# Patient Record
Sex: Male | Born: 1995 | Race: Black or African American | Hispanic: No | Marital: Single
Health system: Southern US, Community
[De-identification: ages and names within clinical notes are randomized; demographics above are authoritative.]

## PROBLEM LIST (undated history)

## (undated) DIAGNOSIS — J45909 Unspecified asthma, uncomplicated: Secondary | ICD-10-CM

---
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
## 2017-04-15 IMAGING — DX DG FINGER MIDDLE 2+V*L*
3 series · 3 of 3 positions shown · non-contrast
Comparison: None.

CLINICAL DATA: Left middle finger pain and swelling following
basketball injury 5 weeks ago.

EXAM:
LEFT MIDDLE FINGER 2+V

[finger ap]
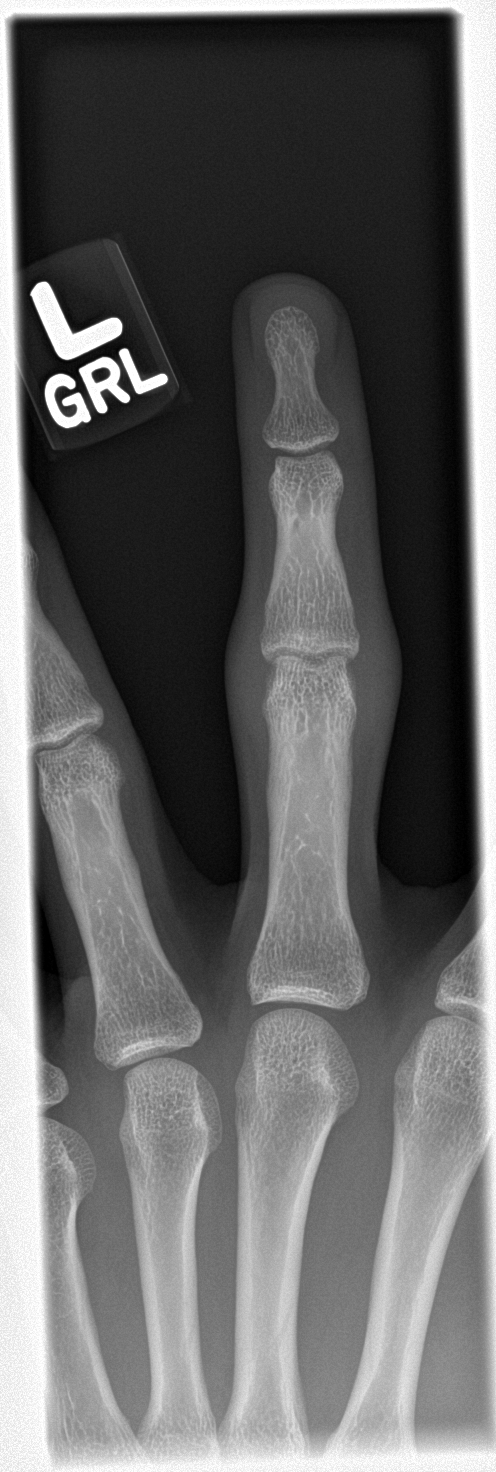

[finger obl]
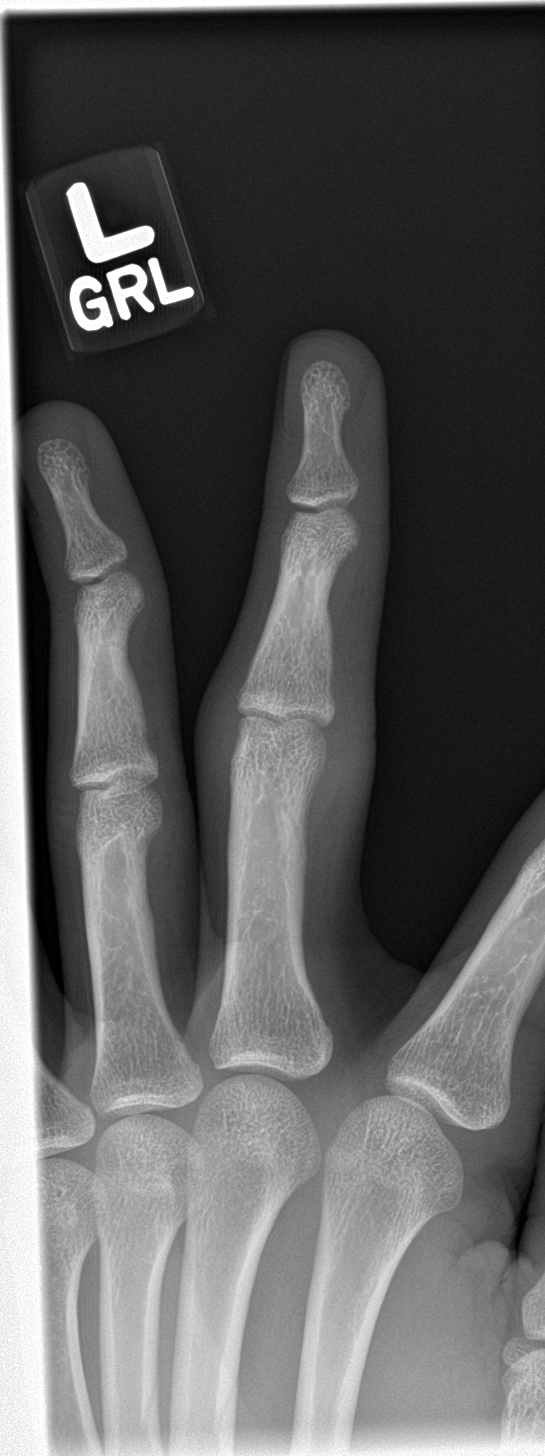

[finger lat]
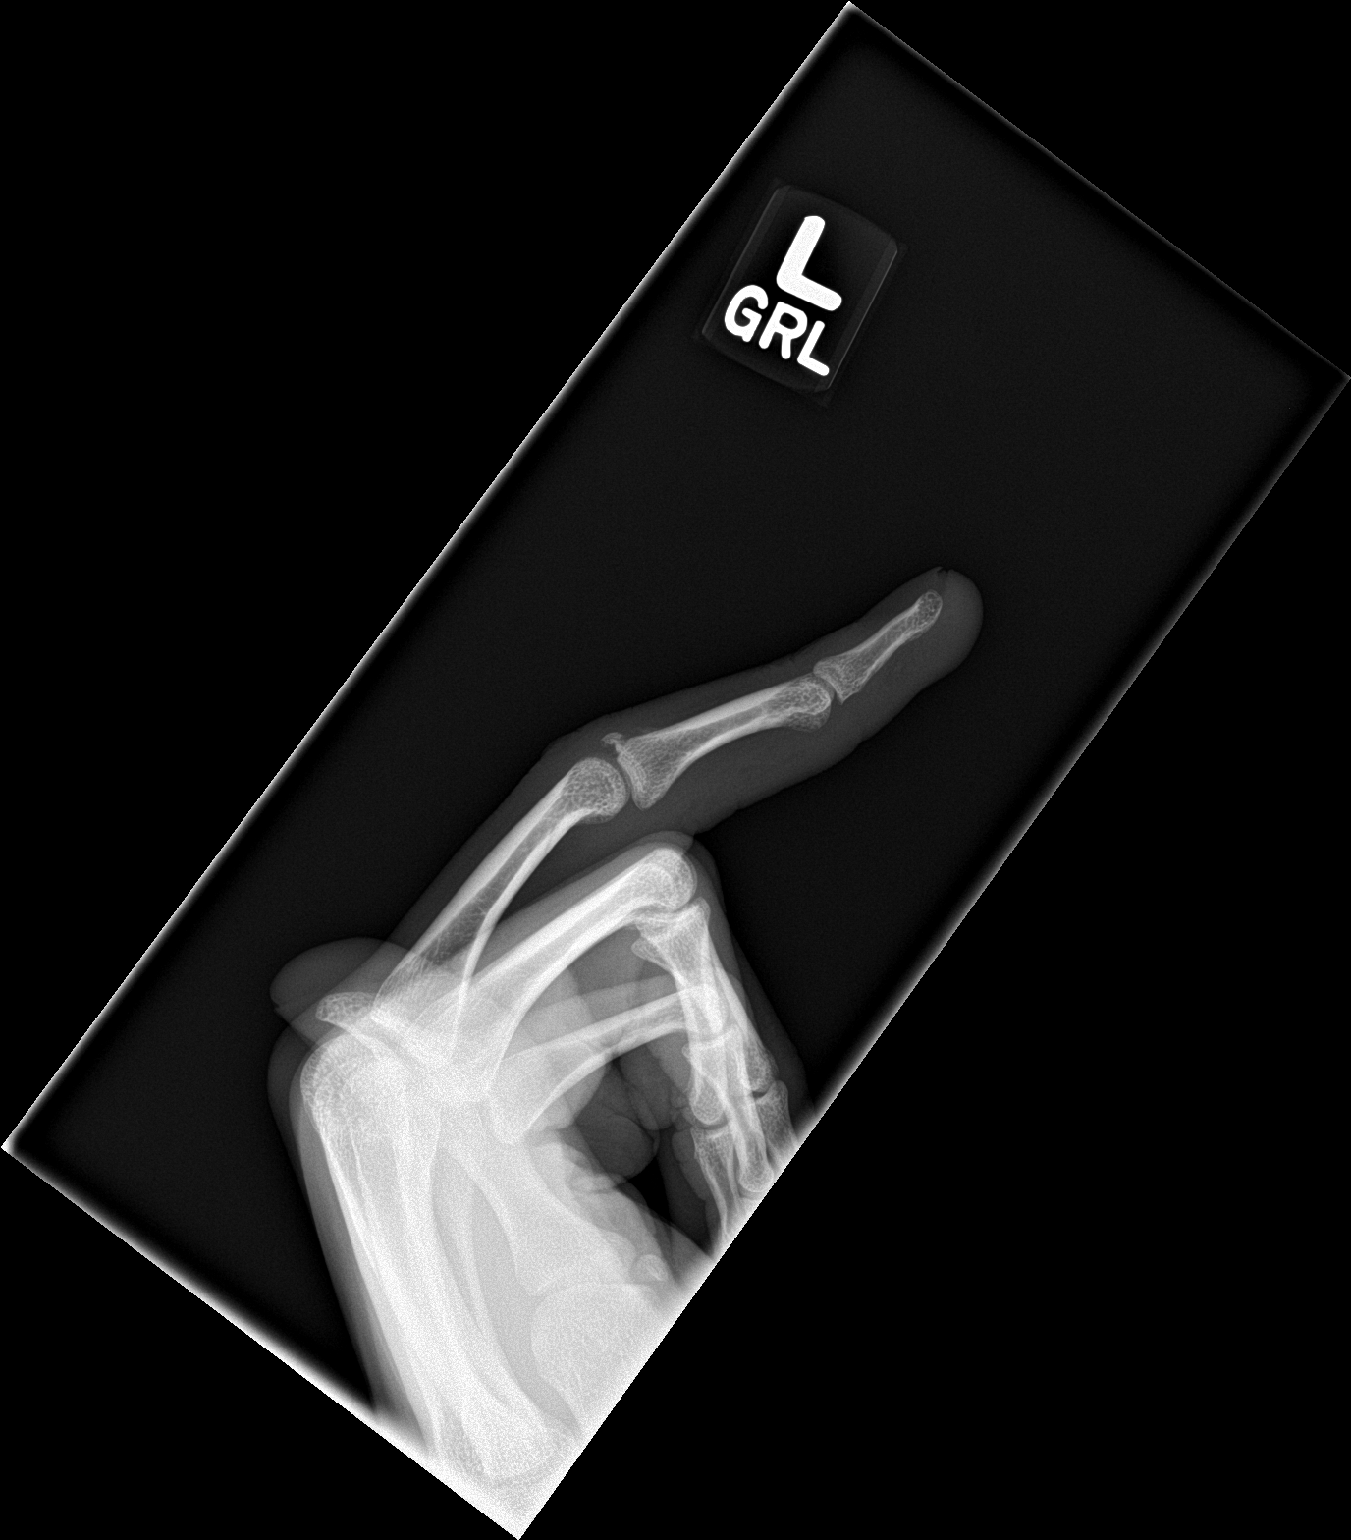

[3 of 3 positions shown; findings below may reference images not displayed]

FINDINGS: There is an avulsion fracture off of the dorsal proximal aspect of
the middle phalanx. Associated soft tissue swelling is noted.

No subluxation or dislocation identified.
IMPRESSION: Avulsion fracture of the dorsal proximal aspect of the middle
phalanx.

## 2017-08-06 ENCOUNTER — Emergency Department
Admission: EM | Admit: 2017-08-06 | Discharge: 2017-08-06 | Disposition: A | Discharge status: Home / Self Care | Payer: Medicaid Other | Attending: Emergency Medicine | Admitting: Emergency Medicine

## 2017-08-06 ENCOUNTER — Other Ambulatory Visit: Payer: Self-pay

## 2017-08-06 DIAGNOSIS — Z5321 Procedure and treatment not carried out due to patient leaving prior to being seen by health care provider: Secondary | ICD-10-CM | POA: Insufficient documentation

## 2017-08-06 DIAGNOSIS — R509 Fever, unspecified: Secondary | ICD-10-CM | POA: Diagnosis not present

## 2017-08-06 DIAGNOSIS — R0989 Other specified symptoms and signs involving the circulatory and respiratory systems: Secondary | ICD-10-CM | POA: Diagnosis present

## 2017-08-06 NOTE — ED Triage Notes (Signed)
Pt arrives to ED via POV with c/o cold-like s/x's. Pt reports chest congestion and fever x2 days. No c/o N/V/D, no CP or ABD pain. Pt is A&O, in NAD; RR even, regular, and unlabored.

## 2018-08-11 ENCOUNTER — Encounter: Payer: Self-pay | Admitting: Emergency Medicine

## 2018-08-11 ENCOUNTER — Emergency Department
Admission: EM | Admit: 2018-08-11 | Discharge: 2018-08-12 | Disposition: A | Discharge status: Other Acute Inpatient Hospital | Payer: Medicaid Other | Attending: Emergency Medicine | Admitting: Emergency Medicine

## 2018-08-11 ENCOUNTER — Other Ambulatory Visit: Payer: Self-pay

## 2018-08-11 DIAGNOSIS — F17200 Nicotine dependence, unspecified, uncomplicated: Secondary | ICD-10-CM | POA: Insufficient documentation

## 2018-08-11 DIAGNOSIS — R4689 Other symptoms and signs involving appearance and behavior: Secondary | ICD-10-CM | POA: Insufficient documentation

## 2018-08-11 DIAGNOSIS — F322 Major depressive disorder, single episode, severe without psychotic features: Secondary | ICD-10-CM | POA: Diagnosis present

## 2018-08-11 DIAGNOSIS — Z046 Encounter for general psychiatric examination, requested by authority: Secondary | ICD-10-CM | POA: Diagnosis present

## 2018-08-11 DIAGNOSIS — J45909 Unspecified asthma, uncomplicated: Secondary | ICD-10-CM | POA: Insufficient documentation

## 2018-08-11 DIAGNOSIS — F329 Major depressive disorder, single episode, unspecified: Secondary | ICD-10-CM | POA: Insufficient documentation

## 2018-08-11 LAB — URINALYSIS, ROUTINE W REFLEX MICROSCOPIC
BACTERIA UA: NONE SEEN
Bilirubin Urine: NEGATIVE
Glucose, UA: NEGATIVE mg/dL
HGB URINE DIPSTICK: NEGATIVE
Ketones, ur: NEGATIVE mg/dL
Nitrite: NEGATIVE
PROTEIN: NEGATIVE mg/dL
Specific Gravity, Urine: 1.015 (ref 1.005–1.030)
pH: 6 (ref 5.0–8.0)

## 2018-08-11 LAB — COMPREHENSIVE METABOLIC PANEL
ALK PHOS: 64 U/L (ref 38–126)
ALT: 56 U/L — AB (ref 0–44)
AST: 233 U/L — AB (ref 15–41)
Albumin: 4.5 g/dL (ref 3.5–5.0)
Anion gap: 10 (ref 5–15)
BUN: 12 mg/dL (ref 6–20)
CALCIUM: 9.2 mg/dL (ref 8.9–10.3)
CO2: 25 mmol/L (ref 22–32)
CREATININE: 0.94 mg/dL (ref 0.61–1.24)
Chloride: 102 mmol/L (ref 98–111)
GFR calc Af Amer: >60 mL/min (ref >60)
Glucose, Bld: 88 mg/dL (ref 70–99)
Potassium: 3.4 mmol/L — ABNORMAL LOW (ref 3.5–5.1)
Sodium: 137 mmol/L (ref 135–145)
Total Bilirubin: 1 mg/dL (ref 0.3–1.2)
Total Protein: 7.5 g/dL (ref 6.5–8.1)

## 2018-08-11 LAB — URINE DRUG SCREEN, QUALITATIVE (ARMC ONLY)
Amphetamines, Ur Screen: NOT DETECTED
Barbiturates, Ur Screen: NOT DETECTED
Benzodiazepine, Ur Scrn: POSITIVE — AB
CANNABINOID 50 NG, UR ~~LOC~~: POSITIVE — AB
COCAINE METABOLITE, UR ~~LOC~~: NOT DETECTED
MDMA (ECSTASY) UR SCREEN: NOT DETECTED
Methadone Scn, Ur: NOT DETECTED
Opiate, Ur Screen: NOT DETECTED
PHENCYCLIDINE (PCP) UR S: NOT DETECTED
Tricyclic, Ur Screen: NOT DETECTED

## 2018-08-11 LAB — CBC WITH DIFFERENTIAL/PLATELET
Abs Immature Granulocytes: 0.03 10*3/uL (ref 0.00–0.07)
Basophils Absolute: 0.1 10*3/uL (ref 0.0–0.1)
Basophils Relative: 1 %
EOS PCT: 4 %
Eosinophils Absolute: 0.4 10*3/uL (ref 0.0–0.5)
HEMATOCRIT: 45.4 % (ref 39.0–52.0)
HEMOGLOBIN: 14.9 g/dL (ref 13.0–17.0)
Immature Granulocytes: 0 %
LYMPHS ABS: 2.5 10*3/uL (ref 0.7–4.0)
LYMPHS PCT: 26 %
MCH: 31 pg (ref 26.0–34.0)
MCHC: 32.8 g/dL (ref 30.0–36.0)
MCV: 94.6 fL (ref 80.0–100.0)
MONO ABS: 0.8 10*3/uL (ref 0.1–1.0)
Monocytes Relative: 8 %
Neutro Abs: 6 10*3/uL (ref 1.7–7.7)
Neutrophils Relative %: 61 %
Platelets: 270 10*3/uL (ref 150–400)
RBC: 4.8 MIL/uL (ref 4.22–5.81)
RDW: 12.8 % (ref 11.5–15.5)
WBC: 9.7 10*3/uL (ref 4.0–10.5)
nRBC: 0 % (ref 0.0–0.2)

## 2018-08-11 LAB — ETHANOL

## 2018-08-11 NOTE — ED Notes (Signed)
IVC prior to arrival / [facility] ordered & called waiting on call back for evaluation

## 2018-08-11 NOTE — ED Notes (Signed)
Hourly rounding reveals patient in room. No complaints, stable, in no acute distress. Q15 minute rounds and monitoring via Rover and Officer to continue.   

## 2018-08-11 NOTE — ED Provider Notes (Addendum)
[facility] Emergency Department Provider Note   ____________________________________________   First MD Initiated Contact with Patient 08/11/18 2127     (approximate)  I have reviewed the triage vital signs and the nursing notes.   HISTORY  Chief Complaint Psychiatric Evaluation    HPI Ronald [name] is a 23 y.o. male patient brought in by [facility] police department.  They tell us that the patient has been acting out at home threatening people breaking things and saying he wants to kill himself.  When I see him he is laying on the bed and will not wake up and will not talk to me.        Past Medical History:  Diagnosis Date  . Asthma     There are no active problems to display for this patient.   History reviewed. No pertinent surgical history.  Prior to Admission medications   Not on File    Allergies Patient has no known allergies.  History reviewed. No pertinent family history.  Social History Social History   Tobacco Use  . Smoking status: Light Tobacco Smoker  . Smokeless tobacco: Never Used  Substance Use Topics  . Alcohol use: No  . Drug use: Not on file    Review of Systems  Unable to obtain  ____________________________________________   PHYSICAL EXAM:  VITAL SIGNS: ED Triage Vitals  Enc Vitals Group     BP 08/11/18 2020 120/78     Pulse Rate 08/11/18 2020 82     Resp 08/11/18 2020 18     Temp 08/11/18 2020 98.5 F (36.9 C)     Temp Source 08/11/18 2020 Oral     SpO2 08/11/18 2020 98 %     Weight 08/11/18 2021 139 lb (63 kg)     Height 08/11/18 2021 5\' 11"  (1.803 m)     Head Circumference --      Peak Flow --      Pain Score 08/11/18 2020 4     Pain Loc --      Pain Edu? --      Excl. in GC? --     Constitutional: Apparently sleeping and only briefly arousable Eyes: Conjunctivae are normal.  Head: Atraumatic. Nose: No congestion/rhinnorhea. Mouth/Throat: Mucous membranes are moist.  Oropharynx  non-erythematous. Neck: No stridor.   Cardiovascular: Normal rate, regular rhythm. Grossly normal heart sounds.  Good peripheral circulation. Respiratory: Normal respiratory effort.  No retractions. Lungs CTAB. Gastrointestinal: Soft and nontender. No distention. No abdominal bruits. No CVA tenderness. Musculoskeletal: No lower extremity tenderness nor edema.  Neurologic:  No gross focal neurologic deficits are appreciated.  Patient does briefly move his extremities symmetrically Skin:  Skin is warm, dry and intact.    ____________________________________________   LABS (all labs ordered are listed, but only abnormal results are displayed)  Labs Reviewed  COMPREHENSIVE METABOLIC PANEL - Abnormal; Notable for the following components:      Result Value   Potassium 3.4 (*)    AST 233 (*)    ALT 56 (*)    All other components within normal limits  URINE DRUG SCREEN, QUALITATIVE ([facility] ONLY) - Abnormal; Notable for the following components:   Cannabinoid 50 Ng, Ur [place] POSITIVE (*)    Benzodiazepine, Ur Scrn POSITIVE (*)    All other components within normal limits  URINALYSIS, ROUTINE W REFLEX MICROSCOPIC - Abnormal; Notable for the following components:   Color, Urine YELLOW (*)    APPearance HAZY (*)    Leukocytes,Ua  LARGE (*)    WBC, UA >50 (*)    All other components within normal limits  ETHANOL  CBC WITH DIFFERENTIAL/PLATELET   ____________________________________________  EKG   ____________________________________________  RADIOLOGY  ED MD interpretation:  Official radiology report(s): No results found.  ____________________________________________   PROCEDURES  Procedure(s) performed (including Critical Care):  Procedures   ____________________________________________   INITIAL IMPRESSION / ASSESSMENT AND PLAN / ED COURSE  Patient was not awake the last time I saw him yet or.  Perhaps pretending to sleep which is a little more likely.  Vitals are  still stable.  Waiting for psych to see him.    ----------------------------------------- 1:26 AM on 08/12/2018 -----------------------------------------  Patient wakes up briefly now says nothing is hurting him he says he does not have any medical problems he says he wants to go to sleep and then he says he wants to eat something and goes to sleep does not wake up again.  He is breathing easily and well.  We will continue to watch him         ____________________________________________   FINAL CLINICAL IMPRESSION(S) / ED DIAGNOSES  Final diagnoses:  Aggressive behavior     ED Discharge Orders    None       Note:  This document was prepared using Dragon voice recognition software and may include unintentional dictation errors.    Arnaldo Natal, MD 08/12/18 [id number]    Arnaldo Natal, MD 08/12/18 [phone]

## 2018-08-11 NOTE — ED Notes (Signed)
Snack and beverage given. 

## 2018-08-11 NOTE — ED Triage Notes (Signed)
Pt arrived to the ED via [employer] under IVC to have a psychiatric evaluation. Pt reports that he has been experiencing problem with his family and would like to be evaluated by a psychiatrist. [employer] officer states that the Pt has been violent towards his family threatening to kill himself and breaking thing around the house. Pt has flight of ideas, tangential conversation, cooperative during triage.

## 2018-08-11 NOTE — ED Notes (Signed)
Hourly rounding reveals patient in room. No complaints, stable, in no acute distress. Q15 minute rounds and monitoring via Security Cameras to continue. 

## 2018-08-11 NOTE — ED Notes (Signed)
Pt. Transferred from Triage to room 21 after dressing out and screening for contraband. Report to include Situation, Background, Assessment and Recommendations from [facility]. Pt. Oriented to Quad including Q15 minute rounds as well as Psychologist, counselling for their protection. Patient is alert and oriented, warm and dry in no acute distress. Patient reported SI contracted for safety. Denied HI, and AVH. Pt. Encouraged to let me know if needs arise.

## 2018-08-11 NOTE — ED Notes (Signed)
Pt has clothing (blue shirt, pants, black socks, blue short pants, white sneakers, wallet with cash.) Hospital security was notified, so that they can inventory the belongings.

## 2018-08-12 ENCOUNTER — Other Ambulatory Visit: Payer: Self-pay

## 2018-08-12 ENCOUNTER — Inpatient Hospital Stay
Admission: AD | Admit: 2018-08-12 | Discharge: 2018-08-13 | DRG: 882 | Disposition: A | Discharge status: Home / Self Care | Payer: Medicaid Other | Source: Intra-hospital | Attending: Psychiatry | Admitting: Psychiatry

## 2018-08-12 ENCOUNTER — Encounter: Payer: Self-pay | Admitting: Psychiatry

## 2018-08-12 DIAGNOSIS — R45851 Suicidal ideations: Secondary | ICD-10-CM | POA: Diagnosis present

## 2018-08-12 DIAGNOSIS — F329 Major depressive disorder, single episode, unspecified: Secondary | ICD-10-CM | POA: Diagnosis not present

## 2018-08-12 DIAGNOSIS — R945 Abnormal results of liver function studies: Secondary | ICD-10-CM | POA: Diagnosis present

## 2018-08-12 DIAGNOSIS — F4323 Adjustment disorder with mixed anxiety and depressed mood: Principal | ICD-10-CM

## 2018-08-12 DIAGNOSIS — F1010 Alcohol abuse, uncomplicated: Secondary | ICD-10-CM

## 2018-08-12 DIAGNOSIS — N342 Other urethritis: Secondary | ICD-10-CM | POA: Diagnosis present

## 2018-08-12 DIAGNOSIS — F1210 Cannabis abuse, uncomplicated: Secondary | ICD-10-CM

## 2018-08-12 DIAGNOSIS — J45909 Unspecified asthma, uncomplicated: Secondary | ICD-10-CM | POA: Diagnosis present

## 2018-08-12 DIAGNOSIS — F322 Major depressive disorder, single episode, severe without psychotic features: Secondary | ICD-10-CM | POA: Diagnosis present

## 2018-08-12 MED ORDER — TRAZODONE HCL 50 MG PO TABS: 50.0000 mg | ORAL_TABLET | Freq: Every evening | ORAL | Status: DC | PRN

## 2018-08-12 MED ORDER — CEFTRIAXONE SODIUM 250 MG IJ SOLR
250.0000 mg | Freq: Once | INTRAMUSCULAR | Status: AC
  Administered 2018-08-12: 250 mg via INTRAMUSCULAR
  Filled 2018-08-12 (×2): qty 250

## 2018-08-12 MED ORDER — ALUM & MAG HYDROXIDE-SIMETH 200-200-20 MG/5ML PO SUSP: 30.0000 mL | ORAL | Status: DC | PRN

## 2018-08-12 MED ORDER — HALOPERIDOL LACTATE 5 MG/ML IJ SOLN: 2.5000 mg | INTRAMUSCULAR | Status: DC | PRN

## 2018-08-12 MED ORDER — LIDOCAINE HCL 1 % IJ SOLN
10.0000 mL | Freq: Once | INTRAMUSCULAR | Status: AC
  Administered 2018-08-12: 10 mL
  Filled 2018-08-12: qty 10

## 2018-08-12 MED ORDER — LORAZEPAM 2 MG/ML IJ SOLN: 1.0000 mg | INTRAMUSCULAR | Status: DC | PRN

## 2018-08-12 MED ORDER — ACETAMINOPHEN 325 MG PO TABS: 650.0000 mg | ORAL_TABLET | Freq: Four times a day (QID) | ORAL | Status: DC | PRN

## 2018-08-12 MED ORDER — MAGNESIUM HYDROXIDE 400 MG/5ML PO SUSP: 30.0000 mL | Freq: Every day | ORAL | Status: DC | PRN

## 2018-08-12 MED ORDER — LORAZEPAM 1 MG PO TABS
1.0000 mg | ORAL_TABLET | ORAL | Status: DC | PRN
  Administered 2018-08-12 (×2): 1 mg via ORAL
  Filled 2018-08-12 (×2): qty 1

## 2018-08-12 MED ORDER — NICOTINE 14 MG/24HR TD PT24
14.0000 mg | MEDICATED_PATCH | Freq: Every day | TRANSDERMAL | Status: DC
  Administered 2018-08-12 – 2018-08-13 (×2): 14 mg via TRANSDERMAL
  Filled 2018-08-12 (×2): qty 1

## 2018-08-12 MED ORDER — HALOPERIDOL 0.5 MG PO TABS: 2.5000 mg | ORAL_TABLET | ORAL | Status: DC | PRN

## 2018-08-12 MED ORDER — HYDROXYZINE HCL 25 MG PO TABS: 25.0000 mg | ORAL_TABLET | ORAL | Status: DC | PRN

## 2018-08-12 MED ORDER — AZITHROMYCIN 1 G PO PACK
1.0000 g | PACK | Freq: Once | ORAL | Status: AC
  Administered 2018-08-12: 1 g via ORAL
  Filled 2018-08-12 (×2): qty 1

## 2018-08-12 MED ORDER — HALOPERIDOL 5 MG PO TABS
2.5000 mg | ORAL_TABLET | ORAL | Status: DC | PRN
  Administered 2018-08-12: 2.5 mg via ORAL
  Filled 2018-08-12: qty 1

## 2018-08-12 MED ORDER — TRAZODONE HCL 50 MG PO TABS
50.0000 mg | ORAL_TABLET | Freq: Every evening | ORAL | Status: DC | PRN
  Administered 2018-08-12: 50 mg via ORAL
  Filled 2018-08-12: qty 1

## 2018-08-12 MED ORDER — NICOTINE 14 MG/24HR TD PT24: 14.0000 mg | MEDICATED_PATCH | Freq: Every day | TRANSDERMAL | Status: DC

## 2018-08-12 MED ORDER — CITALOPRAM HYDROBROMIDE 20 MG PO TABS
20.0000 mg | ORAL_TABLET | Freq: Every day | ORAL | Status: DC
  Administered 2018-08-12 – 2018-08-13 (×2): 20 mg via ORAL
  Filled 2018-08-12 (×2): qty 1

## 2018-08-12 MED ORDER — LORAZEPAM 1 MG PO TABS: 1.0000 mg | ORAL_TABLET | ORAL | Status: DC | PRN

## 2018-08-12 MED ORDER — MAGNESIUM HYDROXIDE 400 MG/5ML PO SUSP
30.0000 mL | Freq: Every day | ORAL | Status: DC | PRN
  Filled 2018-08-12: qty 30

## 2018-08-12 NOTE — ED Notes (Signed)
Hourly rounding reveals patient in room. No complaints, stable, in no acute distress. Q15 minute rounds and monitoring via Rover and Officer to continue.   

## 2018-08-12 NOTE — Progress Notes (Signed)
D: Received patient from [facility] Emergency Department. Patient skin assessment completed with Kayla, BHT, skin is intact, with the exception of superficial scabbing from what looks like the patient's left wrist scrapped the ground, but no contraband found with all unit prohibited items locked and stored away for discharge. Pt. Was admitted under the services of, Dr. Toni Amend.   Patient during the admissions process is only partially compliant, visibly anxious and eventually elevated in agitations. Pt. Was not able to sign admissions paperwork, refusing to sign any required documents. Pt. Upon interviewing with this writer states, "I was told I could leave today upstairs, are you serious?", when told he needed to speak to the doctor about this matter for further details into the length of stay here on the unit. Pt. When attempting to explain to this writer why he has been brought involuntarily to [place] is disorganized in his thought process and his story is difficult to piece together a good history or gather information. Pt. Does answer questions. Pt. Denies si/hi/avh, contract for safety and denies pain, but expresses anger towards family for being sent here. Pt. Given PRN medications to reduce visible agitations and anxiety for comfort and safety.    A: Patient oriented to unit/room/call light. Pt. Given extensive admissions education, but is not accepting of information provided. Patient was encourage to participate in unit activities and continue with plan of care being put into place. Q x 15 minute observation checks were initiated for safety.   R: Patient is not receptive to treatment plan being put into place and safety to be maintained on unit per MD orders.

## 2018-08-12 NOTE — Tx Team (Addendum)
Initial Treatment Plan 08/12/2018 1:20 PM Ghassan Madilyn Fireman PHX:[phone]    PATIENT STRESSORS: Family conflict Mood instability    PATIENT STRENGTHS: Communication skills Physical Health Special hobby/interest Supportive family/friends   PATIENT IDENTIFIED PROBLEMS: Mood instability 08/12/2018  Agitation 08/12/2018                   DISCHARGE CRITERIA:  Improved stabilization in mood, thinking, and/or behavior Motivation to continue treatment in a less acute level of care Need for constant or close observation no longer present Verbal commitment to aftercare and medication compliance  PRELIMINARY DISCHARGE PLAN: Outpatient therapy Return to previous living arrangement Return to previous work or school arrangements  PATIENT/FAMILY INVOLVEMENT: This treatment plan has been presented to and reviewed with the patient, Walden Field.  The patient has been given the opportunity to ask questions and make suggestions.  Lenox Ponds, RN 08/12/2018, 1:20 PM
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
## 2018-08-12 NOTE — Plan of Care (Signed)
Patient just recently admitted to the unit. Patient has not had sufficient time to show progressions at this time. Will continue to monitor for progressions.    Problem: Education: Goal: Emotional status will improve Outcome: Not Progressing Goal: Mental status will improve Outcome: Not Progressing   Problem: Health Behavior/Discharge Planning: Goal: Compliance with treatment plan for underlying cause of condition will improve Outcome: Not Progressing   Problem: Safety: Goal: Periods of time without injury will increase Outcome: Not Progressing   Problem: Education: Goal: Will be free of psychotic symptoms Outcome: Not Progressing

## 2018-08-12 NOTE — ED Notes (Signed)
IVC/SOC completed/ Rec.Inpt Admit  

## 2018-08-12 NOTE — BHH Suicide Risk Assessment (Signed)
[facility] Admission Suicide Risk Assessment   Nursing information obtained from:  Review of record, Patient Demographic factors:  Male, Adolescent or young adult, Low socioeconomic status, Unemployed Current Mental Status:  NA(denies) Loss Factors:  Loss of significant relationship Historical Factors:  Impulsivity Risk Reduction Factors:  NA  Total Time spent with patient: 1 hour Principal Problem: Adjustment disorder with mixed anxiety and depressed mood Diagnosis:  Principal Problem:   Adjustment disorder with mixed anxiety and depressed mood Active Problems:   Cannabis abuse   Alcohol abuse   Urethritis  Subjective Data: Patient interviewed chart reviewed.  Patient is denying any suicidal thoughts.  Denies any homicidal ideation.  Denies any psychotic symptoms.  Admits to some chronic frustration and low mood but no actual wish to harm himself.  Continued Clinical Symptoms:  Alcohol Use Disorder Identification Test Final Score (AUDIT): 0 The "Alcohol Use Disorders Identification Test", Guidelines for Use in Primary Care, Second Edition.  World Science writer [facility]). Score between 0-7:  no or low risk or alcohol related problems. Score between 8-15:  moderate risk of alcohol related problems. Score between 16-19:  high risk of alcohol related problems. Score 20 or above:  warrants further diagnostic evaluation for alcohol dependence and treatment.   CLINICAL FACTORS:   Depression:   Impulsivity   Musculoskeletal: Strength & Muscle Tone: within normal limits Gait & Station: normal Patient leans: N/A  Psychiatric Specialty Exam: Physical Exam  Nursing note and vitals reviewed. Constitutional: He appears well-developed and well-nourished.  HENT:  Head: Normocephalic and atraumatic.  Eyes: Pupils are equal, round, and reactive to light. Conjunctivae are normal.  Neck: Normal range of motion.  Cardiovascular: Regular rhythm and normal heart sounds.  Respiratory: Effort  normal.  GI: Soft.  Musculoskeletal: Normal range of motion.  Neurological: He is alert.  Skin: Skin is warm and dry.  Psychiatric: He has a normal mood and affect. His speech is normal and behavior is normal. Judgment and thought content normal. He is not agitated. Cognition and memory are normal.    Review of Systems  Constitutional: Negative.   HENT: Negative.   Eyes: Negative.   Respiratory: Negative.   Cardiovascular: Negative.   Gastrointestinal: Negative.   Genitourinary: Positive for dysuria.  Musculoskeletal: Negative.   Skin: Negative.   Neurological: Negative.   Psychiatric/Behavioral: Positive for substance abuse.    Blood pressure 135/89, pulse 78, temperature 98.4 F (36.9 C), temperature source Oral, resp. rate 17, height 5\' 11"  (1.803 m), weight 63.7 kg, SpO2 100 %.Body mass index is 19.6 kg/m.  General Appearance: Casual  Eye Contact:  Good  Speech:  Clear and Coherent  Volume:  Normal  Mood:  Euthymic  Affect:  Congruent  Thought Process:  Coherent  Orientation:  Full (Time, Place, and Person)  Thought Content:  Logical  Suicidal Thoughts:  No  Homicidal Thoughts:  No  Memory:  Immediate;   Fair Recent;   Fair Remote;   Fair  Judgement:  Fair  Insight:  Fair  Psychomotor Activity:  Normal  Concentration:  Concentration: Fair  Recall:  [employer] of Knowledge:  Fair  Language:  Fair  Akathisia:  No  Handed:  Right  AIMS (if indicated):     Assets:  Desire for Improvement Physical Health Resilience  ADL's:  Intact  Cognition:  WNL  Sleep:         COGNITIVE FEATURES THAT CONTRIBUTE TO RISK:  Loss of executive function    SUICIDE RISK:   Minimal:  No identifiable suicidal ideation.  Patients presenting with no risk factors but with morbid ruminations; may be classified as minimal risk based on the severity of the depressive symptoms  PLAN OF CARE: Patient will be evaluated by treatment team.  15-minute checks for now.  Reviewed various  treatment options for him.  Starting antidepressant medication.  Patient will be reassessed and discharge planning will be made after suicidality is reassessed before discharge.  I certify that inpatient services furnished can reasonably be expected to improve the patient's condition.   Mordecai Rasmussen, MD 08/12/2018, 4:56 PM

## 2018-08-12 NOTE — Progress Notes (Signed)
Pt. Arrived to unit

## 2018-08-12 NOTE — ED Provider Notes (Signed)
-----------------------------------------   4:39 AM on 08/12/2018 -----------------------------------------   Blood pressure 120/78, pulse 82, temperature 98.5 F (36.9 C), temperature source Oral, resp. rate 18, height 5\' 11"  (1.803 m), weight 63 kg, SpO2 98 %.  The patient is calm and cooperative at this time.  There have been no acute events since the last update. Psychiatry consult note reviewed, recommends inpatient psychiatric care.  PRN Haldol and Ativan as needed for agitation and anxiety.  Continue IVC.   Sharman Cheek, MD 08/12/18 [phone]

## 2018-08-12 NOTE — ED Notes (Signed)
Patient assigned to appropriate care area   Introduced self to pt  Patient oriented to unit/care area: Informed that, for their safety, care areas are designed for safety and visiting and phone hours explained to patient. Patient verbalizes understanding, and verbal contract for safety obtained  Environment secured  

## 2018-08-12 NOTE — BHH Group Notes (Signed)
LCSW Group Therapy Note  08/12/2018 1:00 PM  Type of Therapy/Topic:  Group Therapy:  Feelings about Diagnosis  Participation Level:  Did Not Attend   Description of Group:   This group will allow patients to explore their thoughts and feelings about diagnoses they have received. Patients will be guided to explore their level of understanding and acceptance of these diagnoses. Facilitator will encourage patients to process their thoughts and feelings about the reactions of others to their diagnosis and will guide patients in identifying ways to discuss their diagnosis with significant others in their lives. This group will be process-oriented, with patients participating in exploration of their own experiences, giving and receiving support, and processing challenge from other group members.   Therapeutic Goals: 1. Patient will demonstrate understanding of diagnosis as evidenced by identifying two or more symptoms of the disorder 2. Patient will be able to express two feelings regarding the diagnosis 3. Patient will demonstrate their ability to communicate their needs through discussion and/or role play  Summary of Patient Progress: X     Therapeutic Modalities:   Cognitive Behavioral Therapy Brief Therapy Feelings Identification   Ruthann Angulo, MSW, LCSW 08/12/2018 2:35 PM   

## 2018-08-12 NOTE — ED Notes (Signed)
Patient observed lying in bed with eyes closed  Even, unlabored respirations observed   NAD pt appears to be sleeping  I will continue to monitor along with every 15 minute visual observations and ongoing security monitoring    
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
## 2018-08-12 NOTE — ED Notes (Signed)
Patient being transferred to [facility] room 324

## 2018-08-12 NOTE — ED Notes (Signed)

## 2018-08-13 LAB — COMPREHENSIVE METABOLIC PANEL
ALK PHOS: 58 U/L (ref 38–126)
ALT: 51 U/L — ABNORMAL HIGH (ref 0–44)
AST: 123 U/L — AB (ref 15–41)
Albumin: 4 g/dL (ref 3.5–5.0)
Anion gap: 6 (ref 5–15)
BUN: 12 mg/dL (ref 6–20)
CO2: 29 mmol/L (ref 22–32)
Calcium: 9.3 mg/dL (ref 8.9–10.3)
Chloride: 103 mmol/L (ref 98–111)
Creatinine, Ser: 0.96 mg/dL (ref 0.61–1.24)
GFR calc Af Amer: >60 mL/min (ref >60)
GFR calc non Af Amer: >60 mL/min (ref >60)
GLUCOSE: 90 mg/dL (ref 70–99)
Potassium: 4.2 mmol/L (ref 3.5–5.1)
Sodium: 138 mmol/L (ref 135–145)
Total Bilirubin: 1.2 mg/dL (ref 0.3–1.2)
Total Protein: 7.4 g/dL (ref 6.5–8.1)

## 2018-08-13 MED ORDER — CITALOPRAM HYDROBROMIDE 20 MG PO TABS: 20.0000 mg | ORAL_TABLET | Freq: Every day | ORAL | 1 refills | Status: DC

## 2018-08-13 NOTE — Progress Notes (Signed)
Recreation Therapy Notes  Date: 08/13/2018  Time: 9:30 am   Location: Craft room   Behavioral response: N/A   Intervention Topic: Coping Skills  Discussion/Intervention: Patient did not attend group.   Clinical Observations/Feedback:  Patient did not attend group.   [name] LRT/CTRS        Ronald [name] 08/13/2018 11:18 AM 

## 2018-08-13 NOTE — [facility] Suicide Risk Assessment (Signed)
[facility] INPATIENT:  Family/Significant Other Suicide Prevention Education  Suicide Prevention Education:  Patient Refusal for Family/Significant Other Suicide Prevention Education: The patient Ronald [name] has refused to provide written consent for family/significant other to be provided Family/Significant Other Suicide Prevention Education during admission and/or prior to discharge.  Physician notified.  SPE completed with pt, as pt refused to consent to family contact. SPI pamphlet provided to pt and pt was encouraged to share information with support network, ask questions, and talk about any concerns relating to SPE. Pt denies access to guns/firearms and verbalized understanding of information provided. Mobile Crisis information also provided to pt.    Charlann Lange Tava Peery MSW LCSW 08/13/2018, 9:56 AM

## 2018-08-13 NOTE — Progress Notes (Signed)
Patient alert and oriented x 4, denies SI/HI/AVH, no distress noted affect is flat and sad, forwards very little and makes poor eye contact. Patient appears tired, he was restless this evening, receptive to staff, and took evening medication. Patient rated depression a 5/10, he stated " l am worried about where l go after discharge' 15 minutes safety checks maintained will continue to monitor.

## 2018-08-13 NOTE — BHH Counselor (Signed)
PSA not completed due to pt being discharged within 24 hours of admission.   Charlann Lange Dorthula Bier MSW LCSW 08/13/2018 9:54 AM

## 2018-08-13 NOTE — BHH Suicide Risk Assessment (Signed)
[facility] Discharge Suicide Risk Assessment   Principal Problem: Adjustment disorder with mixed anxiety and depressed mood Discharge Diagnoses: Principal Problem:   Adjustment disorder with mixed anxiety and depressed mood Active Problems:   Cannabis abuse   Alcohol abuse   Urethritis   Total Time spent with patient: 45 minutes  Musculoskeletal: Strength & Muscle Tone: within normal limits Gait & Station: normal Patient leans: N/A  Psychiatric Specialty Exam: Review of Systems  Constitutional: Negative.   HENT: Negative.   Eyes: Negative.   Respiratory: Negative.   Cardiovascular: Negative.   Gastrointestinal: Negative.   Musculoskeletal: Negative.   Skin: Negative.   Neurological: Negative.   Psychiatric/Behavioral: Negative.     Blood pressure 123/75, pulse 84, temperature 98.3 F (36.8 C), temperature source Oral, resp. rate 16, height 5\' 11"  (1.803 m), weight 63.7 kg, SpO2 100 %.Body mass index is 19.6 kg/m.  General Appearance: Fairly Groomed  Patent attorney::  Good  Speech:  Clear and Coherent409  Volume:  Normal  Mood:  Euthymic  Affect:  Appropriate  Thought Process:  Coherent  Orientation:  Full (Time, Place, and Person)  Thought Content:  Logical  Suicidal Thoughts:  No  Homicidal Thoughts:  No  Memory:  Immediate;   Fair Recent;   Fair Remote;   Fair  Judgement:  Fair  Insight:  Fair  Psychomotor Activity:  Normal  Concentration:  Fair  Recall:  [employer] of Knowledge:Fair  Language: Fair  Akathisia:  No  Handed:  Right  AIMS (if indicated):     Assets:  Desire for Improvement Physical Health Resilience Talents/Skills Vocational/Educational  Sleep:  Number of Hours: 7.25  Cognition: WNL  ADL's:  Intact   Mental Status Per Nursing Assessment::   On Admission:  NA(denies)  Demographic Factors:  Male and Adolescent or young adult  Loss Factors: Financial problems/change in socioeconomic status  Historical Factors: NA  Risk Reduction  Factors:   Positive social support  Continued Clinical Symptoms:  Depression:   Impulsivity Alcohol/Substance Abuse/Dependencies  Cognitive Features That Contribute To Risk:  Loss of executive function    Suicide Risk:  Minimal: No identifiable suicidal ideation.  Patients presenting with no risk factors but with morbid ruminations; may be classified as minimal risk based on the severity of the depressive symptoms    Plan Of Care/Follow-up recommendations:  Activity:  Activity as tolerated without any restriction Diet:  Regular diet Other:  Patient counseled to continue outpatient treatment and will be referred to [facility] for mental health and substance abuse follow-up.  He is agreeable.  Mordecai Rasmussen, MD 08/13/2018, 9:21 AM

## 2018-08-13 NOTE — Tx Team (Signed)
Treatment team not completed with pt as pt is discharging prior to scheduled treatment team.  Iris Pert, MSW, LCSW Clinical Social Work 08/13/2018 9:57 AM

## 2018-08-13 NOTE — Progress Notes (Addendum)
Pt received AVS, prescription, transition record, suicide risk. It was reviewed and pt understood. Pt denied suicidal ideations. All the belongings were given back to pt including his phone, wallet, money , bank card and clothes etc. [employer] picked the pt up at the visitors entrance and voucher was given to the driver.  Torrie Mayers Rn

## 2018-08-13 NOTE — Plan of Care (Signed)
  Problem: Education: Goal: Will be free of psychotic symptoms Outcome: Progressing  Patient appears free of psychosis.  
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
## 2018-08-13 NOTE — Progress Notes (Signed)
  [facility] Adult Case Management Discharge Plan :  Will you be returning to the same living situation after discharge:  No. At discharge, do you have transportation home?: Yes,  bus pass or taxi will be provided Do you have the ability to pay for your medications: Yes,  cardinal medicaid  Release of information consent forms completed and in the chart;   Patient to Follow up at: Follow-up Information    [facility] Follow up on 08/15/2018.   Why:  [employer] [facility] will pick you up Friday 08/15/2018 at 7:00 AM for peer support services. If you have any questions please contact [employer] at [phone]. Thank you. Contact information: [address] [place] [address] [phone]           Next level of care provider has access to [facility] Link:no  Safety Planning and Suicide Prevention discussed: Yes,  SPE completed wit pt  Have you used any form of tobacco in the last 30 days? (Cigarettes, Smokeless Tobacco, Cigars, and/or Pipes): Yes  Has patient been referred to the Quitline?: Patient refused referral  Patient has been referred for addiction treatment: N/A  Mechele Dawley, LCSW 08/13/2018, 9:59 AM

## 2018-11-21 ENCOUNTER — Other Ambulatory Visit (HOSPITAL_COMMUNITY): Payer: Self-pay | Admitting: Family Medicine

## 2018-11-26 LAB — GONOCOCCUS CULTURE

## 2018-12-08 ENCOUNTER — Encounter: Payer: Self-pay | Admitting: Nurse Practitioner

## 2018-12-09 ENCOUNTER — Encounter: Payer: Self-pay | Admitting: Nurse Practitioner

## 2019-01-15 ENCOUNTER — Ambulatory Visit: Payer: Medicaid Other | Admitting: Nurse Practitioner

## 2019-01-15 ENCOUNTER — Other Ambulatory Visit: Payer: Self-pay

## 2019-01-15 DIAGNOSIS — Z113 Encounter for screening for infections with a predominantly sexual mode of transmission: Secondary | ICD-10-CM

## 2019-01-15 NOTE — Progress Notes (Signed)
Here today for STD screening. Accepts bloodwork. Mahima Hottle, RN ° °

## 2019-01-15 NOTE — Progress Notes (Signed)
STI clinic/screening visit  Subjective:  Ronald [name] is a 23 y.o. male being seen today for an STI screening visit. The patient reports they unclear on symptoms have symptoms.  Patient has the following medical conditions:   Patient Active Problem List   Diagnosis Date Noted  . MDD (major depressive disorder), severe (HCC) 08/12/2018  . Adjustment disorder with mixed anxiety and depressed mood 08/12/2018  . Cannabis abuse 08/12/2018  . Alcohol abuse 08/12/2018     Chief Complaint  Patient presents with  . SEXUALLY TRANSMITTED DISEASE    Desires testing for HIV and syphilis    Here for STD testing C/O the following symptoms: " irritation" - points to genital area Seems very agitated   Client asked the following questions for STD screening:  Allergies: NKDA Previous Surgeries: no [facility] - see problem lists Medications - denies taking Celexa Contact to STD  - none Antibiotics in last 2 wks  - no Last HIV test - 11/15/2018 Prior STD - GC Immunizations UTD - Tdap -10/2008 Last sex  - 2 wks  X last 2 wks - declines to answer Partners Last 2 months - 2 Type of sex - oral, anal and genital Smoke - 1/2 ppd - yes ETOH - 2 months ago IVD - denies MJ use - 1 month ago Travel in last 3 months  Abuse hx  - none  Last urination - yesterday     Patient reports - desire for HIV testing only after long discussion about STD's   The following portions of the patient's history were reviewed and updated as appropriate: allergies, current medications, past medical history, past social history, past surgical history and problem list.  Objective:  There were no vitals filed for this visit.  Physical Exam Vitals signs reviewed.  Constitutional:      Appearance: Normal appearance.  HENT:     Head: Normocephalic and atraumatic.     Mouth/Throat:     Mouth: Mucous membranes are moist.     Pharynx: Oropharynx is clear. No oropharyngeal exudate or posterior oropharyngeal  erythema.  Pulmonary:     Effort: Pulmonary effort is normal.  Abdominal:     General: Abdomen is flat.     Palpations: Abdomen is soft. There is no hepatomegaly or mass.     Tenderness: There is no abdominal tenderness.  Skin:    General: Skin is warm and dry.     Findings: No rash.  Neurological:     Mental Status: He is alert and oriented to person, place, and time.  Psychiatric:        Mood and Affect: Mood is anxious.        Speech: Speech is rapid and pressured.        Behavior: Behavior is agitated. Behavior is cooperative.       Assessment and Plan:  Ronald [name] is a 23 y.o. male presenting to the [facility] for STI screening  1. Screening examination for STD (sexually transmitted disease) Await results Any questions or concerns before we get started - discussed STD's and risks at length  - HIV [place] [facility] - Syphilis Serology, [facility]   1. Screening examination for STD (sexually transmitted disease) Await results - HIV [place] [facility] - Syphilis Serology, [facility]  Client notified that he would be contacted for positive results only.  Client verbalizes understanding and is in agreement with plan of care   Return if symptoms worsen or  fail to improve.  No future appointments.  Berniece Andreas, NP

## 2019-01-16 ENCOUNTER — Encounter: Payer: Self-pay | Admitting: Nurse Practitioner

## 2019-05-06 ENCOUNTER — Other Ambulatory Visit: Payer: Self-pay

## 2019-05-06 ENCOUNTER — Encounter: Payer: Self-pay | Admitting: Emergency Medicine

## 2019-05-06 ENCOUNTER — Emergency Department
Admission: EM | Admit: 2019-05-06 | Discharge: 2019-05-07 | Disposition: A | Discharge status: Other Acute Inpatient Hospital | Payer: Medicaid Other | Attending: Emergency Medicine | Admitting: Emergency Medicine

## 2019-05-06 DIAGNOSIS — F29 Unspecified psychosis not due to a substance or known physiological condition: Secondary | ICD-10-CM | POA: Diagnosis not present

## 2019-05-06 DIAGNOSIS — F10229 Alcohol dependence with intoxication, unspecified: Secondary | ICD-10-CM | POA: Diagnosis not present

## 2019-05-06 DIAGNOSIS — Z79899 Other long term (current) drug therapy: Secondary | ICD-10-CM | POA: Insufficient documentation

## 2019-05-06 DIAGNOSIS — F17200 Nicotine dependence, unspecified, uncomplicated: Secondary | ICD-10-CM | POA: Diagnosis not present

## 2019-05-06 DIAGNOSIS — Z20828 Contact with and (suspected) exposure to other viral communicable diseases: Secondary | ICD-10-CM | POA: Insufficient documentation

## 2019-05-06 DIAGNOSIS — Y907 Blood alcohol level of 200-239 mg/100 ml: Secondary | ICD-10-CM | POA: Diagnosis not present

## 2019-05-06 DIAGNOSIS — F22 Delusional disorders: Secondary | ICD-10-CM | POA: Diagnosis present

## 2019-05-06 DIAGNOSIS — Z046 Encounter for general psychiatric examination, requested by authority: Secondary | ICD-10-CM | POA: Diagnosis not present

## 2019-05-06 DIAGNOSIS — J45909 Unspecified asthma, uncomplicated: Secondary | ICD-10-CM | POA: Diagnosis not present

## 2019-05-06 DIAGNOSIS — F10929 Alcohol use, unspecified with intoxication, unspecified: Secondary | ICD-10-CM

## 2019-05-06 DIAGNOSIS — F23 Brief psychotic disorder: Secondary | ICD-10-CM | POA: Insufficient documentation

## 2019-05-06 LAB — COMPREHENSIVE METABOLIC PANEL
ALT: 17 U/L (ref 0–44)
AST: 25 U/L (ref 15–41)
Albumin: 4.4 g/dL (ref 3.5–5.0)
Alkaline Phosphatase: 78 U/L (ref 38–126)
Anion gap: 10 (ref 5–15)
BUN: 10 mg/dL (ref 6–20)
CO2: 25 mmol/L (ref 22–32)
Calcium: 9.3 mg/dL (ref 8.9–10.3)
Chloride: 106 mmol/L (ref 98–111)
Creatinine, Ser: 0.91 mg/dL (ref 0.61–1.24)
GFR calc Af Amer: >60 mL/min (ref >60)
GFR calc non Af Amer: >60 mL/min (ref >60)
Glucose, Bld: 88 mg/dL (ref 70–99)
Potassium: 5 mmol/L (ref 3.5–5.1)
Sodium: 141 mmol/L (ref 135–145)
Total Bilirubin: 0.6 mg/dL (ref 0.3–1.2)
Total Protein: 8.2 g/dL — ABNORMAL HIGH (ref 6.5–8.1)

## 2019-05-06 LAB — LIPASE, BLOOD: Lipase: 29 U/L (ref 11–51)

## 2019-05-06 LAB — CBC WITH DIFFERENTIAL/PLATELET
Abs Immature Granulocytes: 0.02 10*3/uL (ref 0.00–0.07)
Basophils Absolute: 0.1 10*3/uL (ref 0.0–0.1)
Basophils Relative: 1 %
Eosinophils Absolute: 0.3 10*3/uL (ref 0.0–0.5)
Eosinophils Relative: 4 %
HCT: 49.2 % (ref 39.0–52.0)
Hemoglobin: 16.2 g/dL (ref 13.0–17.0)
Immature Granulocytes: 0 %
Lymphocytes Relative: 33 %
Lymphs Abs: 2.1 10*3/uL (ref 0.7–4.0)
MCH: 31.1 pg (ref 26.0–34.0)
MCHC: 32.9 g/dL (ref 30.0–36.0)
MCV: 94.4 fL (ref 80.0–100.0)
Monocytes Absolute: 0.5 10*3/uL (ref 0.1–1.0)
Monocytes Relative: 9 %
Neutro Abs: 3.3 10*3/uL (ref 1.7–7.7)
Neutrophils Relative %: 53 %
Platelets: 312 10*3/uL (ref 150–400)
RBC: 5.21 MIL/uL (ref 4.22–5.81)
RDW: 13.2 % (ref 11.5–15.5)
WBC: 6.3 10*3/uL (ref 4.0–10.5)
nRBC: 0 % (ref 0.0–0.2)

## 2019-05-06 LAB — ETHANOL: Alcohol, Ethyl (B): 223 mg/dL — ABNORMAL HIGH (ref <10)

## 2019-05-06 LAB — ACETAMINOPHEN LEVEL: Acetaminophen (Tylenol), Serum: <10 ug/mL — ABNORMAL LOW (ref 10–30)

## 2019-05-06 LAB — SALICYLATE LEVEL: Salicylate Lvl: <7 mg/dL (ref 2.8–30.0)

## 2019-05-06 MED ORDER — HALOPERIDOL 5 MG PO TABS
5.0000 mg | ORAL_TABLET | Freq: Four times a day (QID) | ORAL | Status: DC | PRN
  Administered 2019-05-06: 5 mg via ORAL
  Filled 2019-05-06: qty 1

## 2019-05-06 MED ORDER — DIPHENHYDRAMINE HCL 50 MG/ML IJ SOLN
50.0000 mg | Freq: Four times a day (QID) | INTRAMUSCULAR | Status: DC | PRN
  Filled 2019-05-06: qty 1

## 2019-05-06 MED ORDER — LORAZEPAM 2 MG PO TABS
2.0000 mg | ORAL_TABLET | ORAL | Status: DC | PRN
  Administered 2019-05-06: 2 mg via ORAL
  Filled 2019-05-06: qty 1

## 2019-05-06 MED ORDER — HALOPERIDOL LACTATE 5 MG/ML IJ SOLN
5.0000 mg | Freq: Four times a day (QID) | INTRAMUSCULAR | Status: DC | PRN
  Filled 2019-05-06: qty 1

## 2019-05-06 MED ORDER — DIPHENHYDRAMINE HCL 25 MG PO CAPS
50.0000 mg | ORAL_CAPSULE | Freq: Four times a day (QID) | ORAL | Status: DC | PRN
  Administered 2019-05-06: 50 mg via ORAL
  Filled 2019-05-06: qty 2

## 2019-05-06 MED ORDER — LORAZEPAM 2 MG/ML IJ SOLN
2.0000 mg | INTRAMUSCULAR | Status: DC | PRN
  Filled 2019-05-06: qty 1

## 2019-05-06 NOTE — ED Notes (Signed)
Patient is appears to be manic, his conversation is very disorganized, he has talked about his mother not wanting him because his father left his mother, he has talked about being in love but love dont love him. Writer unable to get any clear answers from him.

## 2019-05-06 NOTE — ED Notes (Signed)
Pt. Given meal tray and drink upon request as evening snack.

## 2019-05-06 NOTE — ED Notes (Signed)
Patient given Haldol 5 mg PO, Benadryl 50 mg PO, and Ativan 2 mg PO due to escalating behavior after speaking with the psychiatrist. Patient was upset that he has to stay here, patient yelled "let me go to a hotel., why cant I go to a hotel".

## 2019-05-06 NOTE — ED Notes (Signed)
Patient sleeping awakened for dinner tray, patient went back to sleep, vss. Awaiting transfer to lower level psych after change of shift. Patient going to room # 303

## 2019-05-06 NOTE — ED Notes (Signed)
Patient assigned to appropriate care area   Introduced self to pt  Patient oriented to unit/care area: Informed that, for their safety, care areas are designed for safety and visiting and phone hours explained to patient. Patient verbalizes understanding, and verbal contract for safety obtained  Environment secured  Pt talking loudly  - appears manic  Per IVC paperwork patient has a hx of mental illness, he lives with his parents and three other brothers. Today he was drinking and irate and began fighting with his brothers. Patient thinks we are here to kill him and to give him a vaccine. He supposed to be taking medications but he is not taking it and he missed his schedule appointment at [facility] yesterday

## 2019-05-06 NOTE — ED Triage Notes (Signed)
Pt in under IVC

## 2019-05-06 NOTE — ED Notes (Signed)
Patient belongings   Black boots Pearline Cables and black coat  [employer] 2 book bags (black book bag and blue/black book bag)

## 2019-05-06 NOTE — ED Notes (Signed)
IVC/Consult completed/ Pending placement 

## 2019-05-06 NOTE — Consult Note (Signed)
Ronald Face-to-Face Psychiatry Consult   Reason for Consult: Bizarre behavior and aggression  referring Physician: Dr. Scotty Court  patient Identification: Ronald [name] MRN:  [id number] Principal Diagnosis: <principal problem not specified> Diagnosis:  Active Problems:   * No active hospital problems. *   Total Time spent with patient: 30 minutes  Subjective:   Lot Ronald [name] is a 23 y.o. male patient with bizarre behavior and aggression in the home brought in by police under IVC.  HPI: Patient is a 23 year old male with a past mental health history of adjustment disorder who presents with increasingly bizarre behavior and aggression in the home.  Patient was brought in by police under IVC with reports of fighting his brothers and not taking his psychiatric medications.  Per collateral information from the patient's mother, patient was originally admitted in March due to bizarre behavior and depression at which point he was given a diagnosis of adjustment disorder and discharged to outpatient care.  Since that time patient had been prescribed risperidone but was not compliant with his medications nor his outpatient appointments.  Mother states that patient has been isolating in his room and will drink alcohol almost every day.  She reports that more recently his emotions have become out of control where the patient appears very emotional as well as will get aggressive very easily.  She also reports that his speech has become more bizarre as well as paranoid and she feels as if she does not know what he is talking about most of the time.  She does state that patient is able to go to work and hold a job but she is fearful that he is spiraling out of control.  Upon evaluation patient is very vague with regards to his answers.  He states that he feels his mom is trying to kill him because she called the police on him.  Also gives other paranoid remarks that everyone out it is out to get him.  He denies  any audio or visual hallucinations.  He does endorse insomnia as well as anxious mood.  He states that he is very intelligent man who does not need any mental health help.  He does not acknowledge depressed mood now or in the past.  He gives fantastical answers when asked how many times he had to stay in the hospital and becomes irate at the time of informing the patient that he will again have to stay in the hospital.  Patient was observed to be laughing inappropriately throughout the evaluation and made many illogical statements.  He was ordered Haldol 5 Ativan 2 and Benadryl 50 due to his agitation.    Past Psychiatric History: Patient has 1 prior psychiatric hospitalization in March of this year.  That time he was given a diagnosis of adjustment disorder.  Since that time he has been seen at [facility] only approximately 3 times.  States that he was previously prescribed risperidone but has not been taking the medication. Risk to Self: Suicidal Ideation: No Suicidal Intent: No Is patient at risk for suicide?: No Suicidal Plan?: No Access to Means: No What has been your use of drugs/alcohol within the last 12 months?: Alcohol  How many times?: 0 Other Self Harm Risks: Alcohol Abuse Triggers for Past Attempts: None known Intentional Self Injurious Behavior: None Risk to Others: Homicidal Ideation: No Thoughts of Harm to Others: No Current Homicidal Intent: No Current Homicidal Plan: No Access to Homicidal Means: No Identified Victim: None reported History of harm to others?:  No(Pt's mom reports that pt attempts to harm her and siblings) Assessment of Violence: None Noted Violent Behavior Description: Pt makes attempts to hurt family family members Does patient have access to weapons?: No Criminal Charges Pending?: No Does patient have a court date: No Prior Inpatient Therapy: Prior Inpatient Therapy: Yes Prior Therapy Dates: 07/2018 [facility] Reason for  Treatment: Aggressive Behaviors Prior Outpatient Therapy: Prior Outpatient Therapy: Yes Prior Therapy Dates: Unknown Prior Therapy Facilty/Provider(s): [facility] Reason for Treatment: Delusions, Medication Management Does patient have an ACCT team?: No Does patient have Intensive In-House Services?  : No Does patient have [facility] services? : No Does patient have P4CC services?: No  Past Medical History:  Past Medical History:  Diagnosis Date  . Asthma    History reviewed. No pertinent surgical history. Family History: No family history on file. Family Psychiatric  History: Per collateral information there is a history of severe mental illness on both the mother and the father side.  Mother is unsure of the diagnoses but reports that family members had to be institutionalized. Social History:  Social History   Substance and Sexual Activity  Alcohol Use No     Social History   Substance and Sexual Activity  Drug Use Not Currently    Social History   Socioeconomic History  . Marital status: Single    Spouse name: Not on file  . Number of children: Not on file  . Years of education: Not on file  . Highest education level: Not on file  Occupational History  . Not on file  Social Needs  . Financial resource strain: Not on file  . Food insecurity    Worry: Not on file    Inability: Not on file  . Transportation needs    Medical: Not on file    Non-medical: Not on file  Tobacco Use  . Smoking status: Heavy Tobacco Smoker    Packs/day: 0.50    Types: Cigarettes  . Smokeless tobacco: Never Used  Substance and Sexual Activity  . Alcohol use: No  . Drug use: Not Currently  . Sexual activity: Never  Lifestyle  . Physical activity    Days per week: Not on file    Minutes per session: Not on file  . Stress: Not on file  Relationships  . Social Musicianconnections    Talks on phone: Not on file    Gets together: Not on file    Attends religious service: Not on file    Active member  of club or organization: Not on file    Attends meetings of clubs or organizations: Not on file    Relationship status: Not on file  Other Topics Concern  . Not on file  Social History Narrative  . 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  He acknowledges marijuana use in the past but none currently. he acknowledges alcohol use    Allergies:  No Known Allergies  Labs:  Results for orders placed or performed during the hospital encounter of 05/06/19 (from the past 48 hour(s))  Acetaminophen level     Status: Abnormal   Collection Time: 05/06/19 12:42 PM  Result Value Ref Range   Acetaminophen (Tylenol), Serum <10 (L) 10 - 30 ug/mL    Comment: (NOTE) Therapeutic concentrations vary significantly. A range of 10-30 ug/mL  may be an effective concentration for many patients. However, some  are best treated at  concentrations outside of this range. Acetaminophen concentrations >150 ug/mL at 4 hours after ingestion  and >50 ug/mL at 12 hours after ingestion are often associated with  toxic reactions. Performed at [facility], [address] [place], [place] [address]   Comprehensive metabolic panel     Status: Abnormal   Collection Time: 05/06/19 12:42 PM  Result Value Ref Range   Sodium 141 135 - 145 mmol/L   Potassium 5.0 3.5 - 5.1 mmol/L   Chloride 106 98 - 111 mmol/L   CO2 25 22 - 32 mmol/L   Glucose, Bld 88 70 - 99 mg/dL   BUN 10 6 - 20 mg/dL   Creatinine, Ser 0.91 0.61 - 1.24 mg/dL   Calcium 9.3 8.9 - 10.3 mg/dL   Total Protein 8.2 (H) 6.5 - 8.1 g/dL   Albumin 4.4 3.5 - 5.0 g/dL   AST 25 15 - 41 U/L   ALT 17 0 - 44 U/L   Alkaline Phosphatase 78 38 - 126 U/L   Total Bilirubin 0.6 0.3 - 1.2 mg/dL   GFR calc non Af Amer >60 >60 mL/min   GFR calc Af Amer >60 >60 mL/min   Anion gap 10 5 - 15    Comment: Performed at [facility], [address] [place], [place] [address]  Ethanol      Status: Abnormal   Collection Time: 05/06/19 12:42 PM  Result Value Ref Range   Alcohol, Ethyl (B) 223 (H) <10 mg/dL    Comment: (NOTE) Lowest detectable limit for serum alcohol is 10 mg/dL. For medical purposes only. Performed at [facility], [address] [place], [place] [address]   Lipase, blood     Status: None   Collection Time: 05/06/19 12:42 PM  Result Value Ref Range   Lipase 29 11 - 51 U/L    Comment: Performed at [facility], [address] [place], [place] [address]  Salicylate level     Status: None   Collection Time: 05/06/19 12:42 PM  Result Value Ref Range   Salicylate Lvl <3.2 2.8 - 30.0 mg/dL    Comment: Performed at [facility], [address] [place], [place] [address]  CBC with Differential     Status: None   Collection Time: 05/06/19 12:42 PM  Result Value Ref Range   WBC 6.3 4.0 - 10.5 K/uL   RBC 5.21 4.22 - 5.81 MIL/uL   Hemoglobin 16.2 13.0 - 17.0 g/dL   HCT 49.2 39.0 - 52.0 %   MCV 94.4 80.0 - 100.0 fL   MCH 31.1 26.0 - 34.0 pg   MCHC 32.9 30.0 - 36.0 g/dL   RDW 13.2 11.5 - 15.5 %   Platelets 312 150 - 400 K/uL   nRBC 0.0 0.0 - 0.2 %   Neutrophils Relative % 53 %   Neutro Abs 3.3 1.7 - 7.7 K/uL   Lymphocytes Relative 33 %   Lymphs Abs 2.1 0.7 - 4.0 K/uL   Monocytes Relative 9 %   Monocytes Absolute 0.5 0.1 - 1.0 K/uL   Eosinophils Relative 4 %   Eosinophils Absolute 0.3 0.0 - 0.5 K/uL   Basophils Relative 1 %   Basophils Absolute 0.1 0.0 - 0.1 K/uL   Immature Granulocytes 0 %   Abs Immature Granulocytes 0.02 0.00 - 0.07 K/uL    Comment: Performed at [facility], [address] [place], [place] [address]    Current Facility-Administered Medications  Medication Dose Route Frequency Provider  Last Rate Last Dose  . diphenhydrAMINE (BENADRYL) capsule 50 mg  50 mg Oral Q6H PRN Hallel Denherder, Worthy Rancher, MD   50 mg at 05/06/19 1615   Or  . diphenhydrAMINE (BENADRYL) injection 50 mg  50 mg Intramuscular  Q6H PRN Anaia Frith A, MD      . haloperidol (HALDOL) tablet 5 mg  5 mg Oral Q6H PRN Staysha Truby, Worthy Rancher, MD   5 mg at 05/06/19 1615   Or  . haloperidol lactate (HALDOL) injection 5 mg  5 mg Intramuscular Q6H PRN Jaydence Arnesen A, MD      . LORazepam (ATIVAN) tablet 2 mg  2 mg Oral Q4H PRN Saqib Cazarez, Worthy Rancher, MD   2 mg at 05/06/19 1615   Or  . LORazepam (ATIVAN) injection 2 mg  2 mg Intramuscular Q4H PRN Corky Blumstein, Worthy Rancher, MD       Current Outpatient Medications  Medication Sig Dispense Refill  . citalopram (CELEXA) 20 MG tablet Take 1 tablet (20 mg total) by mouth daily. (Patient not taking: Reported on 05/06/2019) 30 tablet 1    Musculoskeletal: Strength & Muscle Tone: within normal limits Gait & Station: normal Patient leans: N/A  Psychiatric Specialty Exam: Physical Exam  Review of Systems  Constitutional: Negative for fever.  Eyes: Negative for blurred vision.  Gastrointestinal: Negative for heartburn.  Endo/Heme/Allergies: Does not bruise/bleed easily.  Psychiatric/Behavioral: Positive for substance abuse. Negative for depression, hallucinations, memory loss and suicidal ideas. The patient is nervous/anxious and has insomnia.     Blood pressure 114/84, pulse 86, temperature 99.8 F (37.7 C), temperature source Oral, resp. rate 19, height 5\' 6"  (1.676 m), weight 63.5 kg, SpO2 100 %.Body mass index is 22.6 kg/m.  General Appearance: Fairly Groomed  Eye Contact:  Minimal  Speech:  Pressured  Volume:  Increased  Mood:  Anxious  Affect:  Labile  Thought Process:  Irrelevant  Orientation:  Full (Time, Place, and Person)  Thought Content:  Illogical, Paranoid Ideation, Rumination and Tangential  Suicidal Thoughts:  No  Homicidal Thoughts:  No  Memory:  Recent;   Fair  Judgement:  Impaired  Insight:  Lacking  Psychomotor Activity:  Increased  Concentration:  Concentration: Poor  Recall:  Poor  Fund of Knowledge:  Fair  Language:  Fair  Akathisia:  No  Handed:   Left  AIMS (if indicated):     Assets:  Physical Health Social Support  ADL's:  Impaired  Cognition:  WNL  Sleep:        Treatment Plan Summary: 23 year old male with history of mental health issues who presents in a psychotic state with bizarre aggressive behavior.  Patient requires inpatient hospitalization for safety stabilization and medication management.  Diagnosis will be given as psychosis NOS, however patient's course and history is concerning for bipolar disorder versus schizoaffective disorder.\  Medications: Will hold off on medications for unit selection patient was given a one-time dose of Haldol Ativan and Benadryl due to agitation in the ED  Disposition: Recommend psychiatric Inpatient admission when medically cleared.  36, MD 05/06/2019 5:27 PM

## 2019-05-06 NOTE — ED Provider Notes (Signed)
[facility] Emergency Department Provider Note  ____________________________________________  Time seen: Approximately 2:23 PM  I have reviewed the triage vital signs and the nursing notes.   HISTORY  Chief Complaint IVC  Level 5 Caveat: Portions of the History and Physical including HPI and review of systems are unable to be completely obtained due to patient being a poor historian    HPI Ronald [name] is a 23 y.o. male with a history of polysubstance abuse and major depressive disorder who is brought to the ED under involuntary commitment due to alcohol abuse today, becoming agitated and fighting with his brothers.  Per IVC paperwork, patient has delusions centered on law enforcement and paranoia, thinking his family is trying to give him a vaccine to kill him.  His thought process is disorganized and is unable to provide any clear history.   Past Medical History:  Diagnosis Date  . Asthma      Patient Active Problem List   Diagnosis Date Noted  . MDD (major depressive disorder), severe (HCC) 08/12/2018  . Adjustment disorder with mixed anxiety and depressed mood 08/12/2018  . Cannabis abuse 08/12/2018  . Alcohol abuse 08/12/2018     History reviewed. No pertinent surgical history.   Prior to Admission medications   Medication Sig Start Date End Date Taking? Authorizing Provider  citalopram (CELEXA) 20 MG tablet Take 1 tablet (20 mg total) by mouth daily. 08/14/18   Clapacs, Jackquline Denmark, MD     Allergies Patient has no known allergies.   No family history on file.  Social History Social History   Tobacco Use  . Smoking status: Heavy Tobacco Smoker    Packs/day: 0.50    Types: Cigarettes  . Smokeless tobacco: Never Used  Substance Use Topics  . Alcohol use: No  . Drug use: Not Currently    Review of Systems  Constitutional:   No fever or chills.  ENT:   No sore throat. No rhinorrhea. Cardiovascular:   No chest pain or  syncope. Respiratory:   No dyspnea or cough. Gastrointestinal:   Negative for abdominal pain, vomiting and diarrhea.  Musculoskeletal:   Negative for focal pain or swelling All other systems reviewed and are negative except as documented above in ROS and HPI.  ____________________________________________   PHYSICAL EXAM:  VITAL SIGNS: ED Triage Vitals  Enc Vitals Group     BP 05/06/19 1241 114/84     Pulse Rate 05/06/19 1241 86     Resp 05/06/19 1241 19     Temp 05/06/19 1241 99.8 F (37.7 C)     Temp Source 05/06/19 1241 Oral     SpO2 05/06/19 1241 100 %     Weight 05/06/19 1234 140 lb (63.5 kg)     Height 05/06/19 1234 5\' 6"  (1.676 m)     Head Circumference --      Peak Flow --      Pain Score 05/06/19 1233 0     Pain Loc --      Pain Edu? --      Excl. in GC? --     Vital signs reviewed, nursing assessments reviewed.   Constitutional:   Alert and oriented to person and place. Non-toxic appearance.  Agitated Eyes:   Conjunctivae are normal. EOMI.  ENT      Head:   Normocephalic and atraumatic.        Neck:   No meningismus. Full ROM. Cardiovascular:   RRR. Symmetric bilateral radial and DP pulses.  No murmurs. Cap refill less than 2 seconds. Respiratory:   Normal respiratory effort without tachypnea/retractions. Breath sounds are clear and equal bilaterally. No wheezes/rales/rhonchi. Gastrointestinal:   Soft and nontender. Non distended. There is no CVA tenderness.  No rebound, rigidity, or guarding. Musculoskeletal:   Normal range of motion in all extremities. No joint effusions.  No lower extremity tenderness.  No edema. Neurologic:   Normal speech, disorganized language.  Exhibits flight of ideas, paranoia, delusions Motor grossly intact. No acute focal neurologic deficits are appreciated.  Skin:    Skin is warm, dry and intact. No rash noted.  No petechiae, purpura, or bullae.  ____________________________________________    LABS (pertinent  positives/negatives) (all labs ordered are listed, but only abnormal results are displayed) Labs Reviewed  COMPREHENSIVE METABOLIC PANEL - Abnormal; Notable for the following components:      Result Value   Total Protein 8.2 (*)    All other components within normal limits  ETHANOL - Abnormal; Notable for the following components:   Alcohol, Ethyl (B) 223 (*)    All other components within normal limits  LIPASE, BLOOD  CBC WITH DIFFERENTIAL/PLATELET  ACETAMINOPHEN LEVEL  SALICYLATE LEVEL  URINE DRUG SCREEN, QUALITATIVE ([facility] ONLY)   ____________________________________________   EKG    ____________________________________________    RADIOLOGY  No results found.  ____________________________________________   PROCEDURES Procedures  ____________________________________________    CLINICAL IMPRESSION / ASSESSMENT AND PLAN / ED COURSE  Medications ordered in the ED: Medications - No data to display  Pertinent labs & imaging results that were available during my care of the patient were reviewed by me and considered in my medical decision making (see chart for details).  Ronald [name] was evaluated in Emergency Department on 05/06/2019 for the symptoms described in the history of present illness. He was evaluated in the context of the global COVID-19 pandemic, which necessitated consideration that the patient might be at risk for infection with the SARS-CoV-2 virus that causes COVID-19. Institutional protocols and algorithms that pertain to the evaluation of patients at risk for COVID-19 are in a state of rapid change based on information released by regulatory bodies including the CDC and federal and state organizations. These policies and algorithms were followed during the patient's care in the ED.   Patient presents with psychiatric symptoms concerning for mania.  Continue IVC.  Vital signs are normal he has no acute medical complaints and exam is benign.  He is  medically stable to proceed with psychiatry evaluation and disposition.  If he has increasing agitation he may require some antipsychotics or Ativan to calm him, but currently he appears able to wait for psychiatry consult first..      ____________________________________________   FINAL CLINICAL IMPRESSION(S) / ED DIAGNOSES    Final diagnoses:  Alcoholic intoxication with complication ([place])  Acute psychosis [facility])     ED Discharge Orders    None      Portions of this note were generated with dragon dictation software. Dictation errors may occur despite best attempts at proofreading.   Carrie Mew, MD 05/06/19 [id number]

## 2019-05-06 NOTE — BH Assessment (Signed)
Patient is to be admitted to [facility] BMU by Dr. Ainsley Spinner.  Attending Physician will be Dr. Weber Cooks.   Patient has been assigned to room 303, by [place].    ER staff is aware of the admission:  [facility] ER Secretary    Dr. Charna Archer, ER MD   Roxanna Mew Patient's Nurse   Santiago Glad Patient Access.

## 2019-05-06 NOTE — ED Notes (Signed)
COVID swab obtained and sent to lab.

## 2019-05-06 NOTE — BH Assessment (Signed)
Assessment Note  Ronald [name] is an 23 y.o. male presenting to the ER under IVC; petitioner being patient's step father. Patient reports that he is in the hospital due to "because those people I live with don't work and they are  mentally incapable of working." Patient reports "I didn't put my hands on anybody, I didn't put my hands around anybody's neck" although writer never insinuated that patient did. Patient reports "I get lied on by people" According to IVC paperwork patient was under the influence of alcohol and began fighting with his brothers that he lives with. Patient admits to drinking but reports "I only had a couple of sips" but refused to discuss how much he had been drinking. Patient presented with labile mood and continued to go between laughter, crying, and anger when discussing his need to get out of the hospital with a disorganized thought process. Patient would go between sitting up in the bed to laying down and laughing when asked questions. 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 Patient admits that he was [facility] but reports that he stopped going. Patient appeared to be in denial of his mental health issues and reports "nothing is wrong with me." Patient was not able to recall the type of medication he was being prescribed. When told that he would be admitted patient got angry and blurted out things such as "I hate black people, I like white people, forget I just said that."   Collateral information was obtained from step father and mother of patient. Step father reports that patient "came home drinking, he wakes up early in the morning and starts eating when he does drink." Patient's step father reports when patient is under the influence he will "talk about the past a lot."  Patient's mother reports that patient's drinking has "intensified" and reports that patient drinks daily. Patient's mother reports that patient "he is always emotional and rageful when he drinks." Patient's mother reports "this was the second time he tried to fight me and his brothers" and that patient "broke down and cried yesterday." Patient's mother reports that patient has been having paranoid thoughts for the past 2 years and was being seen by [facility] but has missed "lots of appointments and he won't take his medicine."   Diagnosis: Acute Psychosis, Alcohol Use Disorder Severe F10.20  Past Medical History:  Past Medical History:  Diagnosis Date  . Asthma     History reviewed. No pertinent surgical history.  Family History: No family history on file.  Social History:  reports that he has been smoking cigarettes. He has been smoking about 0.50 packs per day. He has never used smokeless tobacco. He reports previous drug use. He reports that he does not drink alcohol.  Additional Social History:  Alcohol / Drug Use Pain Medications: See MAR Prescriptions: See MAR Over the Counter: See MAR History of alcohol / drug use?: Yes Substance #1 Name of Substance 1: Alcohol 1 - Last Use / Amount: 05/06/2019  CIWA: CIWA-Ar BP: 114/84 Pulse Rate: 86 COWS:    Allergies: No Known Allergies  Home Medications: (Not in a hospital admission)   OB/GYN Status:  No LMP for male patient.  General Assessment Data [facility] Assessment: In system Is this  a Tele or Face-to-Face Assessment?: Face-to-Face Is this an Initial Assessment or a Re-assessment for this encounter?: Initial Assessment Patient Accompanied by:: N/A(Self) Language Other than English: No Living Arrangements: Other (Comment)(Private Residence with mother, step father and brothers) What gender do you identify as?: Male Marital status: Single Living Arrangements: Other relatives(Lives with mother, step  father and brothers) Can pt return to current living arrangement?: Yes Admission Status: Involuntary Petitioner: Family member Is patient capable of signing voluntary admission?: No Referral Source: Other([place] [employer]) Insurance type: Medicaid  Medical Screening Exam ([place]) Medical Exam completed: Yes  Crisis Care Plan Living Arrangements: Other relatives(Lives with mother, step father and brothers) Legal Guardian: Other:(Self) Name of Psychiatrist: None reported Name of Therapist: None reported  Education Status Is patient currently in school?: No Is the patient employed, unemployed or receiving disability?: Employed  Risk to self with the past 6 months Suicidal Ideation: No Has patient been a risk to self within the past 6 months prior to admission? : No Suicidal Intent: No Has patient had any suicidal intent within the past 6 months prior to admission? : No Is patient at risk for suicide?: No Suicidal Plan?: No Has patient had any suicidal plan within the past 6 months prior to admission? : No Access to Means: No What has been your use of drugs/alcohol within the last 12 months?: Alcohol  Previous Attempts/Gestures: No How many times?: 0 Other Self Harm Risks: Alcohol Abuse Triggers for Past Attempts: None known Intentional Self Injurious Behavior: None Family Suicide History: Unknown Recent stressful life event(s): Conflict (Comment)(Conflict with family members) Persecutory voices/beliefs?: Yes(Pt believes mom is trying to harm him and lie to him) Depression: Yes Depression Symptoms: Tearfulness, Feeling angry/irritable Substance abuse history and/or treatment for substance abuse?: Yes Suicide prevention information given to non-admitted patients: Not applicable  Risk to Others within the past 6 months Homicidal Ideation: No Does patient have any lifetime risk of violence toward others beyond the six months prior to admission? :  No Thoughts of Harm to Others: No Current Homicidal Intent: No Current Homicidal Plan: No Access to Homicidal Means: No Identified Victim: None reported History of harm to others?: No(Pt's mom reports that pt attempts to harm her and siblings) Assessment of Violence: None Noted Violent Behavior Description: Pt makes attempts to hurt family family members Does patient have access to weapons?: No Criminal Charges Pending?: No Does patient have a court date: No Is patient on probation?: No  Psychosis Hallucinations: None noted Delusions: Persecutory(Pt reports that other people are lying to him)  Mental Status Report Appearance/Hygiene: Disheveled, In scrubs Eye Contact: Poor Motor Activity: Freedom of movement, Hyperactivity, Agitation Speech: Loud, Aggressive Level of Consciousness: Alert, Irritable Mood: Labile, Angry Affect: Irritable, Labile Anxiety Level: Moderate Thought Processes: Tangential, Irrelevant Judgement: Impaired Orientation: Person, Place, Time, Appropriate for developmental age, Situation Obsessive Compulsive Thoughts/Behaviors: None  Cognitive Functioning Concentration: Fair Memory: Recent Impaired, Remote Intact Is patient IDD: No Insight: Poor Impulse Control: Poor Appetite: Fair Have you had any weight changes? : No Change Sleep: Decreased Total Hours of Sleep: 0 Vegetative Symptoms: None  ADLScreening [facility] Assessment Services) Patient's cognitive ability adequate to safely complete daily activities?: Yes Patient able to express need for assistance with ADLs?: Yes Independently performs ADLs?: Yes (appropriate for developmental age)  Prior Inpatient Therapy Prior Inpatient Therapy: Yes Prior Therapy Dates: 07/2018 Prior Therapy Facilty/Provider(s): [facility] Reason for Treatment: Aggressive Behaviors  Prior Outpatient Therapy Prior Outpatient Therapy: Yes Prior Therapy Dates: Unknown Prior  Therapy Facilty/Provider(s): [facility] Reason for Treatment:  Delusions, Medication Management Does patient have an ACCT team?: No Does patient have Intensive In-House Services?  : No Does patient have [facility] services? : No Does patient have P4CC services?: No  ADL Screening (condition at time of admission) Patient's cognitive ability adequate to safely complete daily activities?: Yes Is the patient deaf or have difficulty hearing?: No Does the patient have difficulty seeing, even when wearing glasses/contacts?: No Does the patient have difficulty concentrating, remembering, or making decisions?: No Patient able to express need for assistance with ADLs?: Yes Does the patient have difficulty dressing or bathing?: No Independently performs ADLs?: Yes (appropriate for developmental age) Does the patient have difficulty walking or climbing stairs?: No Weakness of Legs: None Weakness of Arms/Hands: None  Home Assistive Devices/Equipment Home Assistive Devices/Equipment: None  Therapy Consults (therapy consults require a physician order) PT Evaluation Needed: No OT Evalulation Needed: No SLP Evaluation Needed: No Abuse/Neglect Assessment (Assessment to be complete while patient is alone) Abuse/Neglect Assessment Can Be Completed: Yes Physical Abuse: Denies Verbal Abuse: Denies Sexual Abuse: Denies Exploitation of patient/patient's resources: Denies Self-Neglect: Denies Values / Beliefs Cultural Requests During Hospitalization: None Spiritual Requests During Hospitalization: None Consults Spiritual Care Consult Needed: No Social Work Consult Needed: No         Child/Adolescent Assessment Running Away Risk: Denies(Pt is an adult)  Disposition: Per Psyc MD patient meets criteria for Inpatient Hospitilization Disposition Initial Assessment Completed for this Encounter: Yes  On Site Evaluation by:   Reviewed with Physician:    Benay Pike MS LCAS-A 05/06/2019 5:05 PM

## 2019-05-06 NOTE — ED Notes (Signed)
Pt. States "I am tired after the medication they gave me this afternoon".  Pt. Calm and cooperative at this time.

## 2019-05-07 ENCOUNTER — Inpatient Hospital Stay
Admission: AD | Admit: 2019-05-07 | Discharge: 2019-05-08 | DRG: 897 | Disposition: A | Discharge status: Home / Self Care | Payer: Medicaid Other | Source: Intra-hospital | Attending: Psychiatry | Admitting: Psychiatry

## 2019-05-07 ENCOUNTER — Encounter: Payer: Self-pay | Admitting: Psychiatry

## 2019-05-07 ENCOUNTER — Other Ambulatory Visit: Payer: Self-pay

## 2019-05-07 DIAGNOSIS — F10959 Alcohol use, unspecified with alcohol-induced psychotic disorder, unspecified: Secondary | ICD-10-CM

## 2019-05-07 DIAGNOSIS — F17210 Nicotine dependence, cigarettes, uncomplicated: Secondary | ICD-10-CM | POA: Diagnosis present

## 2019-05-07 DIAGNOSIS — F10159 Alcohol abuse with alcohol-induced psychotic disorder, unspecified: Principal | ICD-10-CM | POA: Diagnosis present

## 2019-05-07 DIAGNOSIS — F1010 Alcohol abuse, uncomplicated: Secondary | ICD-10-CM | POA: Diagnosis not present

## 2019-05-07 DIAGNOSIS — F29 Unspecified psychosis not due to a substance or known physiological condition: Secondary | ICD-10-CM | POA: Diagnosis present

## 2019-05-07 LAB — SARS CORONAVIRUS 2 (TAT 6-24 HRS): SARS Coronavirus 2: NEGATIVE

## 2019-05-07 MED ORDER — HALOPERIDOL 5 MG PO TABS: 5.0000 mg | ORAL_TABLET | Freq: Four times a day (QID) | ORAL | Status: DC | PRN

## 2019-05-07 MED ORDER — MAGNESIUM HYDROXIDE 400 MG/5ML PO SUSP: 30.0000 mL | Freq: Every day | ORAL | Status: DC | PRN

## 2019-05-07 MED ORDER — HALOPERIDOL LACTATE 5 MG/ML IJ SOLN: 5.0000 mg | Freq: Four times a day (QID) | INTRAMUSCULAR | Status: DC | PRN

## 2019-05-07 MED ORDER — ACETAMINOPHEN 325 MG PO TABS: 650.0000 mg | ORAL_TABLET | Freq: Four times a day (QID) | ORAL | Status: DC | PRN

## 2019-05-07 MED ORDER — DIPHENHYDRAMINE HCL 25 MG PO CAPS: 50.0000 mg | ORAL_CAPSULE | Freq: Four times a day (QID) | ORAL | Status: DC | PRN

## 2019-05-07 MED ORDER — ALUM & MAG HYDROXIDE-SIMETH 200-200-20 MG/5ML PO SUSP: 30.0000 mL | ORAL | Status: DC | PRN

## 2019-05-07 MED ORDER — LORAZEPAM 2 MG PO TABS: 2.0000 mg | ORAL_TABLET | ORAL | Status: DC | PRN

## 2019-05-07 MED ORDER — LORAZEPAM 2 MG/ML IJ SOLN: 2.0000 mg | INTRAMUSCULAR | Status: DC | PRN

## 2019-05-07 MED ORDER — DIPHENHYDRAMINE HCL 50 MG/ML IJ SOLN: 50.0000 mg | Freq: Four times a day (QID) | INTRAMUSCULAR | Status: DC | PRN

## 2019-05-07 NOTE — ED Notes (Addendum)
ED TO INPATIENT HANDOFF REPORT  ED Nurse Name and Phone #: ally  S Name/Age/Gender Ronald [name] 23 y.o. male Room/Bed: EDB6A/EDB6A  Code Status   Code Status: Prior  Home/SNF/Other Home Patient oriented to: self, place, time and situation Is this baseline? Yes   Triage Complete: Triage complete  Chief Complaint beh med eval  Triage Note Pt in under IVC    Allergies No Known Allergies  Level of Care/Admitting Diagnosis ED Disposition    None      B Medical/Surgery History Past Medical History:  Diagnosis Date  . Asthma    History reviewed. No pertinent surgical history.   A IV Location/Drains/Wounds Patient Lines/Drains/Airways Status   Active Line/Drains/Airways    None          Intake/Output Last 24 hours No intake or output data in the 24 hours ending 05/07/19 0934  Labs/Imaging Results for orders placed or performed during the hospital encounter of 05/06/19 (from the past 48 hour(s))  Acetaminophen level     Status: Abnormal   Collection Time: 05/06/19 12:42 PM  Result Value Ref Range   Acetaminophen (Tylenol), Serum <10 (L) 10 - 30 ug/mL    Comment: (NOTE) Therapeutic concentrations vary significantly. A range of 10-30 ug/mL  may be an effective concentration for many patients. However, some  are best treated at concentrations outside of this range. Acetaminophen concentrations >150 ug/mL at 4 hours after ingestion  and >50 ug/mL at 12 hours after ingestion are often associated with  toxic reactions. Performed at [facility], [address] [place], [place] [address]   Comprehensive metabolic panel     Status: Abnormal   Collection Time: 05/06/19 12:42 PM  Result Value Ref Range   Sodium 141 135 - 145 mmol/L   Potassium 5.0 3.5 - 5.1 mmol/L   Chloride 106 98 - 111 mmol/L   CO2 25 22 - 32 mmol/L   Glucose, Bld 88 70 - 99 mg/dL   BUN 10 6 - 20 mg/dL   Creatinine, Ser 0.91 0.61 - 1.24 mg/dL   Calcium 9.3 8.9 - 10.3  mg/dL   Total Protein 8.2 (H) 6.5 - 8.1 g/dL   Albumin 4.4 3.5 - 5.0 g/dL   AST 25 15 - 41 U/L   ALT 17 0 - 44 U/L   Alkaline Phosphatase 78 38 - 126 U/L   Total Bilirubin 0.6 0.3 - 1.2 mg/dL   GFR calc non Af Amer >60 >60 mL/min   GFR calc Af Amer >60 >60 mL/min   Anion gap 10 5 - 15    Comment: Performed at [facility], [address] [place], [place] [address]  Ethanol     Status: Abnormal   Collection Time: 05/06/19 12:42 PM  Result Value Ref Range   Alcohol, Ethyl (B) 223 (H) <10 mg/dL    Comment: (NOTE) Lowest detectable limit for serum alcohol is 10 mg/dL. For medical purposes only. Performed at [facility], [address] [place], [place] [address]   Lipase, blood     Status: None   Collection Time: 05/06/19 12:42 PM  Result Value Ref Range   Lipase 29 11 - 51 U/L    Comment: Performed at [facility], [address] [place], [place] [address]  Salicylate level     Status: None   Collection Time: 05/06/19 12:42 PM  Result Value Ref Range   Salicylate Lvl <9.5 2.8 - 30.0 mg/dL    Comment: Performed at [facility], [address] [place], [place] [address]  CBC with Differential     Status: None   Collection Time: 05/06/19 12:42 PM  Result Value Ref Range   WBC 6.3 4.0 - 10.5 K/uL   RBC 5.21 4.22 - 5.81 MIL/uL   Hemoglobin 16.2 13.0 - 17.0 g/dL   HCT 65.749.2 84.639.0 - 96.252.0 %   MCV 94.4 80.0 - 100.0 fL   MCH 31.1 26.0 - 34.0 pg   MCHC 32.9 30.0 - 36.0 g/dL   RDW 95.213.2 84.111.5 - 32.415.5 %   Platelets 312 150 - 400 K/uL   nRBC 0.0 0.0 - 0.2 %   Neutrophils Relative % 53 %   Neutro Abs 3.3 1.7 - 7.7 K/uL   Lymphocytes Relative 33 %   Lymphs Abs 2.1 0.7 - 4.0 K/uL   Monocytes Relative 9 %   Monocytes Absolute 0.5 0.1 - 1.0 K/uL   Eosinophils Relative 4 %   Eosinophils Absolute 0.3 0.0 - 0.5 K/uL   Basophils Relative 1 %   Basophils Absolute 0.1 0.0 - 0.1 K/uL   Immature Granulocytes 0 %   Abs Immature Granulocytes 0.02 0.00 -  0.07 K/uL    Comment: Performed at [facility], [address] [place], [place] [address]  SARS CORONAVIRUS 2 (TAT 6-24 HRS) Nasopharyngeal Nasopharyngeal Swab     Status: None   Collection Time: 05/06/19  5:54 PM   Specimen: Nasopharyngeal Swab  Result Value Ref Range   SARS Coronavirus 2 NEGATIVE NEGATIVE    Comment: (NOTE) SARS-CoV-2 target nucleic acids are NOT DETECTED. The SARS-CoV-2 RNA is generally detectable in upper and lower respiratory specimens during the acute phase of infection. Negative results do not preclude SARS-CoV-2 infection, do not rule out co-infections with other pathogens, and should not be used as the sole basis for treatment or other patient management decisions. Negative results must be combined with clinical observations, patient history, and epidemiological information. The expected result is Negative. Fact Sheet for Patients: HairSlick.nohttps://www.fda.gov/media/138098/download Fact Sheet for Healthcare Providers: quierodirigir.comhttps://www.fda.gov/media/138095/download This test is not yet approved or cleared by the [place] FDA and  has been authorized for detection and/or diagnosis of SARS-CoV-2 by FDA under an Emergency Use Authorization (EUA). This EUA will remain  in effect (meaning this test can be used) for the duration of the COVID-19 declaration under Section 56 4(b)(1) of the Act, 21 U.S.C. section 360bbb-3(b)(1), unless the authorization is terminated or revoked sooner. Performed at [facility], [address] [place], [place] [address]    No results found.  Pending Labs [employer] (From admission, onward)    Start     Ordered   05/06/19 1316  Urine Drug Screen, Qualitative  Once,   STAT     05/06/19 1315   Signed and Held  Prolactin  [facility] Morning draw,   R     Signed and Held   Signed and Held  TSH  [facility] Morning draw,   R     Signed and Held   Signed and Held  Hepatic function panel  [facility] Morning draw,   R     Signed and Held    Signed and Held  Lipid panel  [facility] Morning draw,   R     Signed and Held   Signed and Held  Magnesium  [facility] Morning draw,   R     Signed and Held          Vitals/Pain Today's Vitals  05/06/19 1402 05/06/19 1505 05/06/19 1831 05/07/19 0925  BP:   (!) 112/53 135/76  Pulse:   90 70  Resp:   16   Temp:   98.4 F (36.9 C) 98.6 F (37 C)  TempSrc:   Oral Oral  SpO2:   100% 96%  Weight:      Height:      PainSc: 0-No pain 0-No pain 0-No pain 0-No pain    Isolation Precautions No active isolations  Medications Medications  haloperidol (HALDOL) tablet 5 mg (5 mg Oral Given 05/06/19 1615)    Or  haloperidol lactate (HALDOL) injection 5 mg ( Intramuscular See Alternative 05/06/19 1615)  LORazepam (ATIVAN) tablet 2 mg (2 mg Oral Given 05/06/19 1615)    Or  LORazepam (ATIVAN) injection 2 mg ( Intramuscular See Alternative 05/06/19 1615)  diphenhydrAMINE (BENADRYL) capsule 50 mg (50 mg Oral Given 05/06/19 1615)    Or  diphenhydrAMINE (BENADRYL) injection 50 mg ( Intramuscular See Alternative 05/06/19 1615)    Mobility walks Low fall risk   Focused Assessments .   R Recommendations: See Admitting Provider Note  Report given to:   Additional Notes:  Hx of polysubstance abuse, depression, adjustment disorder. Brought to ER under IVC due to alcohol abuse, fighting with brothers, delusional and paranoia about law enforcement. Noncompliance with medications. Denies SI/HI.

## 2019-05-07 NOTE — Plan of Care (Signed)
Pt just admitted today Problem: Coping: Goal: Ability to verbalize frustrations and anger appropriately will improve Outcome: Progressing Goal: Ability to demonstrate self-control will improve Outcome: Progressing   Problem: Safety: Goal: Periods of time without injury will increase Outcome: Progressing

## 2019-05-07 NOTE — Tx Team (Signed)
Initial Treatment Plan 05/07/2019 12:25 PM Ronald [name] QMG:[id number]    PATIENT STRESSORS: Medication change or noncompliance Substance abuse Other: agressive behavior   PATIENT STRENGTHS: Ability for insight Average or above average intelligence Capable of independent living Communication skills Motivation for treatment/growth Supportive family/friends   PATIENT IDENTIFIED PROBLEMS: Aggressive Behavior 05/07/2019  Non Compliant with medications 32/67/1245  Family Conflict 80/99/83                 DISCHARGE CRITERIA:  Ability to meet basic life and health needs Motivation to continue treatment in a less acute level of care  PRELIMINARY DISCHARGE PLAN: Outpatient therapy Return to previous living arrangement  PATIENT/FAMILY INVOLVEMENT: This treatment plan has been presented to and reviewed with the patient, Ronald [name], and/or family member,  .  The patient and family have been given the opportunity to ask questions and make suggestions.  Leodis Liverpool, RN 05/07/2019, 12:25 PM

## 2019-05-07 NOTE — Progress Notes (Signed)
   05/07/19 2027  Psych Admission Type (Psych Patients Only)  Admission Status Involuntary  Psychosocial Assessment  Patient Complaints None  Eye Contact Fair  Facial Expression Anxious  Affect Anxious  Speech Logical/coherent  Interaction Assertive  Motor Activity Other (Comment) (WNL)  Appearance/Hygiene In scrubs;Unremarkable  Behavior Characteristics Cooperative;Anxious  Thought Process  Coherency WDL  Content WDL  Delusions WDL  Perception WDL  Hallucination None reported or observed  Judgment Poor  Confusion None  Danger to Self  Current suicidal ideation? Denies  Danger to Others  Danger to Others None reported or observed   Pt is pleasant and calm. Pt says he doesn't know what got him here or why he has to be here. He stated that he spoke with a psychiatrist today but "I can't remember what he said." Pt told that doctor would speak with him again in the morning. Pt denies SI, HI and AVH as well as pain.

## 2019-05-07 NOTE — ED Notes (Signed)
Pt. Woke and requested assistance.  Pt. Asked "When can I go home".  Pt. Reminded he was still under IVC and would be admitted to lower level BMU.  Pt. Worried about his job, pt. Told he would be given a note from hospital to give to employer.  Pt. Denies SI/HI.  Pt. States "I had a bad day yesterday".

## 2019-05-07 NOTE — ED Notes (Signed)
Attempt to call report, no answer

## 2019-05-07 NOTE — ED Notes (Signed)
Report to [facility] in BMU.

## 2019-05-07 NOTE — Progress Notes (Signed)
   Admission Note: Report  From  [facility] ER  D: Pt appeared depressed  With  a flat affect.   Voice  He didn't know why he is here. Stated he got into an argument  With  Mother . Report from ER stated patient was aggressive  With his siblings . Patient  Has been non compliant  With  Medication at home . Patient stated to writer he doesn't need  Medication." I  See what they do to ya " Patient  Reported he drinks 2-3 times a week . Denies any other substances abuse usage .Stated he stopped smoking pot after he got his job at [employer]  denies [place] at this time.   Pt is redirectable and cooperative with assessment.      A: Pt admitted to unit per protocol, skin assessment and search done and no contraband found.  With Rafael Gonzalez Pt  educated on therapeutic milieu rules. Pt was introduced to milieu by nursing staff.    R: Pt was receptive to education about the milieu .  15 min safety checks started. Probation officer offered support

## 2019-05-07 NOTE — ED Notes (Addendum)
Attempt to call report X 2, no answer. Called to [phone] and [phone]

## 2019-05-07 NOTE — ED Notes (Signed)
Charge, Colletta [place] informed of barriers to getting patient to assigned BMU room 303.

## 2019-05-07 NOTE — ED Notes (Addendum)
Attempt to call report-answering staff member states that unit RN(Gwynn)  unaware of patient ,  Requested name and ascom number-states they will call back. Given ascom number [phone] and name.

## 2019-05-07 NOTE — ED Notes (Addendum)
Attempt to call report, no answer. Called to [phone] and [phone]

## 2019-05-07 NOTE — Plan of Care (Signed)
New Admission  Patient has not yet starting processing  information  Problem: Education: Goal: Knowledge of [place] [facility] Education information/materials will improve Outcome: Not Progressing   Problem: Coping: Goal: Ability to verbalize frustrations and anger appropriately will improve Outcome: Not Progressing Goal: Ability to demonstrate self-control will improve Outcome: Not Progressing   Problem: Safety: Goal: Periods of time without injury will increase Outcome: Not Progressing   Problem: Education: Goal: Knowledge of disease or condition will improve Outcome: Not Progressing   Problem: Coping: Goal: Ability to interact with others will improve Outcome: Not Progressing

## 2019-05-07 NOTE — [facility] Group Notes (Signed)
[facility] Notes:  (Nursing/MHT/Case Management/Adjunct)  Date:  05/07/2019  Time:  8:27 PM  Type of Therapy:  Group Therapy  Participation Level:  Did Not Attend   Ronald [name] 05/07/2019, 8:27 PM

## 2019-05-07 NOTE — ED Notes (Signed)
Pt went to bathroom prior to being handed a specimen cup for ordered UDS. Informed to let RN know prior to having to go to restroom again.

## 2019-05-07 NOTE — BHH Group Notes (Signed)
LCSW Group Therapy Note  05/07/2019 11:46 AM  Type of Therapy/Topic:  Group Therapy:  Balance in Life  Participation Level:  Did Not Attend  Description of Group:    This group will address the concept of balance and how it feels and looks when one is unbalanced. Patients will be encouraged to process areas in their lives that are out of balance and identify reasons for remaining unbalanced. Facilitators will guide patients in utilizing problem-solving interventions to address and correct the stressor making their life unbalanced. Understanding and applying boundaries will be explored and addressed for obtaining and maintaining a balanced life. Patients will be encouraged to explore ways to assertively make their unbalanced needs known to significant others in their lives, using other group members and facilitator for support and feedback.  Therapeutic Goals: 1. Patient will identify two or more emotions or situations they have that consume much of in their lives. 2. Patient will identify signs/triggers that life has become out of balance:  3. Patient will identify two ways to set boundaries in order to achieve balance in their lives:  4. Patient will demonstrate ability to communicate their needs through discussion and/or role plays  Summary of Patient Progress: x     Therapeutic Modalities:   Cognitive Behavioral Therapy Solution-Focused Therapy Assertiveness Training  Evalina Field, MSW, LCSW Clinical Social Work 05/07/2019 11:46 AM

## 2019-05-07 NOTE — Progress Notes (Signed)
Pt is calm and cooperative. Pt is still confused why he is here and expresses hurt that his mother enforced it. Pt has been calm and cooperative. Collier Bullock RN

## 2019-05-07 NOTE — ED Notes (Signed)
Pt ate all of breakfast tray. 

## 2019-05-08 DIAGNOSIS — F10959 Alcohol use, unspecified with alcohol-induced psychotic disorder, unspecified: Secondary | ICD-10-CM

## 2019-05-08 DIAGNOSIS — F1010 Alcohol abuse, uncomplicated: Secondary | ICD-10-CM

## 2019-05-08 LAB — HEPATIC FUNCTION PANEL
ALT: 14 U/L (ref 0–44)
AST: 22 U/L (ref 15–41)
Albumin: 3.8 g/dL (ref 3.5–5.0)
Alkaline Phosphatase: 64 U/L (ref 38–126)
Bilirubin, Direct: 0.2 mg/dL (ref 0.0–0.2)
Indirect Bilirubin: 0.8 mg/dL (ref 0.3–0.9)
Total Bilirubin: 1 mg/dL (ref 0.3–1.2)
Total Protein: 7.5 g/dL (ref 6.5–8.1)

## 2019-05-08 LAB — LIPID PANEL
Cholesterol: 161 mg/dL (ref 0–200)
HDL: 96 mg/dL (ref >40)
LDL Cholesterol: 56 mg/dL (ref 0–99)
Total CHOL/HDL Ratio: 1.7 RATIO
Triglycerides: 45 mg/dL (ref <150)
VLDL: 9 mg/dL (ref 0–40)

## 2019-05-08 LAB — MAGNESIUM: Magnesium: 2.2 mg/dL (ref 1.7–2.4)

## 2019-05-08 LAB — TSH: TSH: 1.83 u[IU]/mL (ref 0.350–4.500)

## 2019-05-08 MED ORDER — BENZTROPINE MESYLATE 0.5 MG PO TABS: 0.5000 mg | ORAL_TABLET | Freq: Every day | ORAL | 0 refills | Status: DC

## 2019-05-08 MED ORDER — NICOTINE 21 MG/24HR TD PT24
21.0000 mg | MEDICATED_PATCH | Freq: Every day | TRANSDERMAL | Status: DC
  Administered 2019-05-08: 12:00:00 21 mg via TRANSDERMAL

## 2019-05-08 MED ORDER — RISPERIDONE 1 MG PO TABS: 2.0000 mg | ORAL_TABLET | Freq: Every day | ORAL | Status: DC

## 2019-05-08 MED ORDER — RISPERIDONE 2 MG PO TABS: 2.0000 mg | ORAL_TABLET | Freq: Every day | ORAL | 0 refills | Status: DC

## 2019-05-08 MED ORDER — BENZTROPINE MESYLATE 1 MG PO TABS: 0.5000 mg | ORAL_TABLET | Freq: Every day | ORAL | Status: DC

## 2019-05-08 NOTE — Discharge Summary (Signed)
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  History of alcohol abuse.  Followed at [facility].  Past Medical History:  Past Medical History:  Diagnosis Date  . Asthma    History reviewed. No pertinent surgical history. Family History: History reviewed. No pertinent family history. Family Psychiatric  History: History of alcohol abuse in the family Social History:  Social History   Substance and Sexual Activity  Alcohol Use No     Social History   Substance and Sexual Activity  Drug Use Not Currently    Social History   Socioeconomic History  . Marital status: Single    Spouse name: Not on file  . Number of children: Not on file  . Years of education: Not on file  . Highest education level: Not on file  Occupational History  . Not on file  Tobacco Use  . Smoking status: Heavy Tobacco Smoker    Packs/day: 0.50    Types: Cigarettes  . Smokeless tobacco: Never Used  Substance and Sexual Activity  . Alcohol use: No  . Drug use: Not Currently  . Sexual activity: Never  Other Topics Concern  . Not on file  Social History Narrative  . Not on file   Social Determinants of Health   Financial Resource Strain:   . Difficulty of Paying Living Expenses: Not on file  Food Insecurity:   .  Worried About Charity fundraiser in the Last Year: Not on file  . Ran Out of Food in the Last Year: Not on file  Transportation Needs:   . Lack of Transportation (Medical): Not on file  . Lack of Transportation (Non-Medical): Not on file  Physical Activity:   . Days of Exercise per Week: Not on file  . Minutes of Exercise per Session: Not on file  Stress:   . Feeling of Stress : Not on file  Social Connections:   . Frequency of Communication with Friends and Family: Not on file  . Frequency of Social Gatherings with Friends and Family: Not on file  . Attends Religious Services: Not on file  . Active Member of Clubs or Organizations: Not on file  . Attends Archivist Meetings: Not on file  . Marital Status: Not on file    Hospital Course: Admitted to the inpatient ward.  15-minute checks for safety.  Patient showed no dangerous behavior in the hospital.  He was cooperative and appropriate.  Did not appear to have any psychotic symptoms.  On interview with me today his affect and behavior were normal.  Thoughts appeared normal.  No evidence of dangerousness.  We talked about how when he is intoxicated he is more likely to be impulsive and say or do things that he does not want to have done.  He agreed with all of that.  He agreed with  the plan for continued follow-up at [facility].  I restarted the low dose of risperidone that he may have been on recently.  He met with the representative from [employer].  I think I will take him off commitment and we will discharge him this afternoon.  Physical Findings: AIMS:  , ,  ,  ,    CIWA:    COWS:     Musculoskeletal: Strength & Muscle Tone: within normal limits Gait & Station: normal Patient leans: N/A  Psychiatric Specialty Exam: Physical Exam  Nursing note and vitals reviewed. Constitutional: He appears well-developed and well-nourished.  HENT:  Head: Normocephalic and atraumatic.  Eyes: Pupils are equal, round, and reactive to light.  Conjunctivae are normal.  Cardiovascular: Regular rhythm and normal heart sounds.  Respiratory: Effort normal.  GI: Soft.  Musculoskeletal:        General: Normal range of motion.     Cervical back: Normal range of motion.  Neurological: He is alert.  Skin: Skin is warm and dry.  Psychiatric: He has a normal mood and affect. His behavior is normal. Judgment and thought content normal.    Review of Systems  Constitutional: Negative.   HENT: Negative.   Eyes: Negative.   Respiratory: Negative.   Cardiovascular: Negative.   Gastrointestinal: Negative.   Musculoskeletal: Negative.   Skin: Negative.   Neurological: Negative.   Psychiatric/Behavioral: Negative.     Blood pressure (!) 115/96, pulse 77, temperature 97.9 F (36.6 C), temperature source Oral, resp. rate 18, height '5\' 11"'$  (1.803 m), weight 60.8 kg, SpO2 100 %.Body mass index is 18.69 kg/m.  General Appearance: Casual  Eye Contact:  Good  Speech:  Clear and Coherent  Volume:  Normal  Mood:  Euthymic  Affect:  Congruent  Thought Process:  Goal Directed  Orientation:  Full (Time, Place, and Person)  Thought Content:  Logical  Suicidal Thoughts:  No  Homicidal Thoughts:  No  Memory:  Immediate;   Fair Recent;   Fair Remote;   Fair  Judgement:  Fair  Insight:  Fair  Psychomotor Activity:  Normal  Concentration:  Concentration: Fair  Recall:  [place] of Knowledge:  Fair  Language:  Fair  Akathisia:  No  Handed:  Right  AIMS (if indicated):     Assets:  Communication Skills Desire for Improvement Housing Physical Health Resilience Social Support  ADL's:  Intact  Cognition:  WNL  Sleep:  Number of Hours: 7.25     Have you used any form of tobacco in the last 30 days? (Cigarettes, Smokeless Tobacco, Cigars, and/or Pipes): Yes  Has this patient used any form of tobacco in the last 30 days? (Cigarettes, Smokeless Tobacco, Cigars, and/or Pipes) Yes, Yes, A prescription for an FDA-approved tobacco cessation  medication was offered at discharge and the patient refused  Blood Alcohol level:  Lab Results  Component Value Date   ETH 223 (H) 05/06/2019   ETH <10 42/59/5638    Metabolic Disorder Labs:  No results found for: HGBA1C, MPG No results found for: PROLACTIN Lab Results  Component Value Date   CHOL 161 05/08/2019   TRIG 45 05/08/2019   HDL 96 05/08/2019   CHOLHDL 1.7 05/08/2019   VLDL 9 05/08/2019   LDLCALC 56 05/08/2019    See Psychiatric Specialty Exam and Suicide Risk Assessment completed by Attending Physician prior to discharge.  Discharge destination:  Home  Is patient on multiple antipsychotic therapies at discharge:  No   Has Patient had three or  more failed trials of antipsychotic monotherapy by history:  No  Recommended Plan for Multiple Antipsychotic Therapies: NA  Discharge Instructions    Diet - low sodium heart healthy   Complete by: As directed    Increase activity slowly   Complete by: As directed      Allergies as of 05/08/2019   No Known Allergies     Medication List    TAKE these medications     Indication  benztropine 0.5 MG tablet Commonly known as: COGENTIN Take 1 tablet (0.5 mg total) by mouth at bedtime.  Indication: Extrapyramidal Reaction caused by Medications   risperiDONE 2 MG tablet Commonly known as: RISPERDAL Take 1 tablet (2 mg total) by mouth at bedtime.  Indication: Psychosis        Follow-up recommendations:  Activity:  Activity as tolerated Diet:  Regular diet Other:  Follow-up with [facility]  Comments: Prescriptions given at discharge  Signed: Alethia Berthold, MD 05/08/2019, 12:02 PM

## 2019-05-08 NOTE — Progress Notes (Signed)
  [facility] Adult Case Management Discharge Plan :  Will you be returning to the same living situation after discharge:  No. At discharge, do you have transportation home?: Yes,  taxi Do you have the ability to pay for your medications: Yes,  medicaid  Release of information consent forms completed and in the chart;   Patient to Follow up at: Follow-up Information    [facility] Follow up on 05/11/2019.   Why: You have a hospital discharge appointment scheduled for 05/11/2019 at 12:30. It will be in person. Thank you! Contact information: [place] [address] [phone]           Next level of care provider has access to [place] and Suicide Prevention discussed: Yes,  SPE completed with pt as attempt to contact peer support was unsuccessful.  Have you used any form of tobacco in the last 30 days? (Cigarettes, Smokeless Tobacco, Cigars, and/or Pipes): Yes  Has patient been referred to the Quitline?: Patient refused referral  Patient has been referred for addiction treatment: Pt. refused referral  [place], LCSW 05/08/2019, 1:30 PM

## 2019-05-08 NOTE — BHH Counselor (Signed)
Adult Comprehensive Assessment  Patient ID: Ronald [name], male   DOB: 07-01-1995, 23 y.o.   MRN: [id number]  Information Source: Information source: Patient  Current Stressors:  Patient states their primary concerns and needs for treatment are:: "drinking" Patient states their goals for this hospitilization and ongoing recovery are:: "to figure out why I'm drinking, help me chill, learn from my experience" Educational / Learning stressors: high school diploma Employment / Job issues: currently working at [employer] / Lack of resources (include bankruptcy): employed Housing / Lack of housing: was living with his mother, pt reports plans to go to a hotel at discharge Physical health (include injuries & life threatening diseases): none reported Substance abuse: Pt reports drinking  Living/Environment/Situation:  Living Arrangements: Parent Living conditions (as described by patient or guardian): "It's fine as long as I have my devices, like my computer and DVD player" Who else lives in the home?: Pts mother, step-father and 3 step-brothers How long has patient lived in current situation?: Pt reports his whole life  Family History:  Marital status: Single Are you sexually active?: No Does patient have children?: No  Childhood History:  By whom was/is the patient raised?: Mother/father and step-parent Additional childhood history information: Pt reports he was raised by his mother and step-father Description of patient's relationship with caregiver when they were a child: "varies" Patient's description of current relationship with people who raised him/her: "Varies" Does patient have siblings?: Yes Number of Siblings: 3 Description of patient's current relationship with siblings: Pt has 3 step-brothers and reports his relationship with them is "pretty good" Did patient suffer any verbal/emotional/physical/sexual abuse as a child?: No Did patient suffer from severe childhood  neglect?: No Has patient ever been sexually abused/assaulted/raped as an adolescent or adult?: No Was the patient ever a victim of a crime or a disaster?: No Witnessed domestic violence?: No Has patient been effected by domestic violence as an adult?: No  Education:  Highest grade of school patient has completed: high school diploma Currently a student?: No Learning disability?: Yes What learning problems does patient have?: ADHD and dyslexia  Employment/Work Situation:   Employment situation: Employed 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?: No Are There Guns or Other Weapons in Your Home?: No Are These Comptroller?: (N/A)  Financial Resources:   Financial resources: Income from employment, Medicaid Does patient have a representative payee or guardian?: No  Alcohol/Substance Abuse:   What has been your use of drugs/alcohol within the last 12 months?: Alcohol - pt reports "I keep a lid on it" and stated he started drinking when he turned 23 If attempted suicide, did drugs/alcohol play a role in this?: No Alcohol/Substance Abuse Treatment Hx: Denies past history Has alcohol/substance abuse ever caused legal problems?: No  Social Support System:   Patient's Community Support System: Good Describe Community Support System: [facility] Type of faith/religion: N/A  Leisure/Recreation:   Leisure and Hobbies: "basketball, write lyrics, reading, learning and researching"  Strengths/Needs:   What is the patient's perception of their strengths?: "I'm a good listener, I'm determined" Patient states they can use these personal strengths during their treatment to contribute to their  recovery: "Try to go to some AA  meetings through [place]" Patient states these barriers may affect/interfere with their treatment: none reported Patient states these barriers may affect their return to the community: pt denies Other important information patient would like considered in planning for their treatment: pt wants to continue with [facility]  Discharge Plan:   Currently receiving community mental health services: Yes (From Whom)([facility]) Patient states concerns and preferences for aftercare planning are: Continue with [facility] Patient states they will know when they are safe and ready for discharge when: "I have everything I need" Does patient have access to transportation?: No Does patient have financial barriers related to discharge medications?: No Plan for no access to transportation at discharge: public transportation Plan for living situation after discharge: Pt reports he does not plan to return to his mother's house but to go stay at a hotel Will patient be returning to same living situation after discharge?: No  Summary/Recommendations:   Summary and Recommendations (to be completed by the evaluator): Pt is a 23 yo male living in [place], [place] ([place] with his mother, step-father, and 3 step-brothers. Pt reports plans to not return there and go stay at a hotel at discharge. Pt presents to the hospital seeking treatment for SA, medication stabilization, and aggression. Pt has a diagnosis of Psychosi and MDD by history. Pt is single, employed, reports a good support system, denies history of abuse/trauma, and has [employer]. Pt is agreeable to continue services at [facility]. Pt denies SI/HI/AVH currently. Recommendations for pt include: crisis stabilization, therapeutic milieu, encourage group attendance and participation, medication management for mood stabilization, and development for comprehensive mental wellness plan. CSW assessing for appropriate referrals.  [name] MSW LCSW  05/08/2019 9:59 AM

## 2019-05-08 NOTE — Progress Notes (Signed)
Recreation Therapy Notes   Date: 05/08/2019  Time: 9:30 am   Location: Craft room   Behavioral response: N/A   Intervention Topic: Relaxation   Discussion/Intervention: Patient did not attend group.   Clinical Observations/Feedback:  Patient did not attend group.   Jozsef Wescoat LRT/CTRS        Saad Buhl 05/08/2019 10:43 AM

## 2019-05-08 NOTE — BHH Group Notes (Signed)
LCSW Group Therapy Note  05/08/2019 12:50 PM  Type of Therapy and Topic:  Group Therapy:  Feelings around Relapse and Recovery  Participation Level:  Did Not Attend   Description of Group:    Patients in this group will discuss emotions they experience before and after a relapse. They will process how experiencing these feelings, or avoidance of experiencing them, relates to having a relapse. Facilitator will guide patients to explore emotions they have related to recovery. Patients will be encouraged to process which emotions are more powerful. They will be guided to discuss the emotional reaction significant others in their lives may have to their relapse or recovery. Patients will be assisted in exploring ways to respond to the emotions of others without this contributing to a relapse.  Therapeutic Goals: 1. Patient will identify two or more emotions that lead to a relapse for them 2. Patient will identify two emotions that result when they relapse 3. Patient will identify two emotions related to recovery 4. Patient will demonstrate ability to communicate their needs through discussion and/or role plays   Summary of Patient Progress: X  Therapeutic Modalities:   Cognitive Behavioral Therapy Solution-Focused Therapy Assertiveness Training Relapse Prevention Therapy   Leonid Manus, MSW, LCSW 05/08/2019 12:50 PM 

## 2019-05-08 NOTE — BHH Suicide Risk Assessment (Signed)
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   HENT: Negative.   Eyes: Negative.   Respiratory: Negative.   Cardiovascular: Negative.   Gastrointestinal: Negative.   Musculoskeletal: Negative.   Skin: Negative.   Neurological: Negative.   Psychiatric/Behavioral: Negative.     Blood pressure (!) 115/96, pulse 77, temperature 97.9 F (36.6 C), temperature source Oral, resp. rate 18, height 5\' 11"  (1.803 m), weight 60.8 kg, SpO2 100 %.Body mass index is 18.69 kg/m.  General Appearance: Casual  Eye Contact::  Good  Speech:  Clear and [id number]  Volume:  Normal  Mood:  Euthymic  Affect:  Appropriate  Thought Process:  Goal Directed  Orientation:  Full (Time, Place, and Person)  Thought Content:  WDL  Suicidal Thoughts:  No  Homicidal Thoughts:  No  Memory:  Immediate;   Fair Recent;   Fair Remote;   Fair  Judgement:  Fair  Insight:  Fair  Psychomotor Activity:  Normal  Concentration:  Fair  Recall:  [employer] [place]  Language: Fair  Akathisia:  No  Handed:  Right  AIMS (if indicated):     Assets:  Desire for Improvement Housing Physical Health Social Support  Sleep:  Number of Hours: 7.25  Cognition: WNL  ADL's:  Intact   Mental Status Per Nursing Assessment::   On Admission:  NA  Demographic Factors:  Male and Adolescent or young adult  Loss Factors: NA  Historical Factors: Impulsivity  Risk Reduction Factors:   Sense of responsibility to family, Living with another person, especially a relative, Positive social support and Positive therapeutic relationship  Continued Clinical  Symptoms:  Alcohol/Substance Abuse/Dependencies  Cognitive Features That Contribute To Risk:  None    Suicide Risk:  Minimal: No identifiable suicidal ideation.  Patients presenting with no risk factors but with morbid ruminations; may be classified as minimal risk based on the severity of the depressive symptoms    Plan Of Care/Follow-up recommendations:  Activity:  Activity as tolerated Diet:  Regular diet Other:  Follow-up with outpatient treatment at [place], MD 05/08/2019, 11:52 AM

## 2019-05-08 NOTE — BHH Suicide Risk Assessment (Signed)
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  Patient brought in under IVC by family alleging that he had been aggressive and paranoid.  Patient had not tried to harm himself.  No evidence of physical violence to anyone else.  Consistently denies suicidal ideation.  Today he is sober and is lucid without any clear signs of psychosis.  Completely denies any suicidal or homicidal thought.  Continued Clinical Symptoms:  Alcohol Use Disorder Identification Test Final Score (AUDIT): 3 The "Alcohol Use Disorders Identification Test", Guidelines for Use in Primary Care, Second Edition.  World Pharmacologist [facility]). Score between 0-7:  no or low risk or alcohol related problems. Score between 8-15:  moderate risk of alcohol related problems. Score between 16-19:  high risk of alcohol related problems. Score 20 or above:  warrants further diagnostic evaluation for alcohol dependence and treatment.   CLINICAL FACTORS:   Alcohol/Substance Abuse/Dependencies   Musculoskeletal: Strength & Muscle Tone: within normal limits Gait & Station: normal Patient leans: N/A  Psychiatric Specialty Exam: Physical Exam  Nursing note and vitals reviewed. Constitutional: He appears well-developed and well-nourished.  HENT:  Head: Normocephalic and atraumatic.  Eyes: Pupils are equal, round, and reactive to light. Conjunctivae are normal.  Cardiovascular: Regular rhythm and normal heart sounds.  Respiratory: Effort normal. No respiratory distress.  GI: Soft.  Musculoskeletal:        General: Normal range of motion.     Cervical back: Normal range of motion.  Neurological: He is alert.  Skin: Skin is warm and dry.  Psychiatric: He has a normal mood and affect. His speech is normal and behavior is normal. Judgment and thought content normal. Cognition and memory are normal.    Review of Systems  Constitutional: Negative.   HENT: Negative.   Eyes: Negative.   Respiratory: Negative.   Cardiovascular: Negative.   Gastrointestinal: Negative.   Musculoskeletal: Negative.   Skin: Negative.   Neurological: Negative.   Psychiatric/Behavioral: Negative.     Blood pressure (!) 115/96, pulse 77, temperature 97.9 F (36.6 C), temperature source Oral, resp. rate 18, height 5\' 11"  (1.803 m), weight 60.8 kg, SpO2 100 %.Body mass index is 18.69 kg/m.  General Appearance: Casual  Eye Contact:  Good  Speech:  Clear and Coherent  Volume:  Normal  Mood:  Euthymic  Affect:  Appropriate  Thought Process:  Goal Directed  Orientation:  Full (Time, Place, and Person)  Thought Content:  Logical  Suicidal Thoughts:  No  Homicidal Thoughts:  No  Memory:  Immediate;   Fair Recent;   Fair Remote;   Fair  Judgement:  Fair  Insight:  Fair  Psychomotor Activity:  Normal  Concentration:  Concentration: Fair  Recall:  [employer] of Knowledge:  Fair  Language:  Fair  Akathisia:  No  Handed:  Right  AIMS (if indicated):     Assets:  Desire for Improvement Housing Physical Health Resilience Social Support  ADL's:  Intact  Cognition:  WNL  Sleep:  Number of Hours: 7.25      COGNITIVE FEATURES THAT CONTRIBUTE TO RISK:  None    SUICIDE RISK:  Minimal: No identifiable suicidal ideation.  Patients presenting with no risk factors but with morbid ruminations; may be classified as minimal risk based on the severity of the depressive symptoms  PLAN OF CARE: Patient no longer meets commitment criteria.  Supportive counseling educational counseling all done.  Reviewed outpatient treatment plan.  Patient already has  outpatient treatment in place and can be discharged today.  I certify that inpatient services furnished can reasonably be expected to improve the patient's condition.   Mordecai Rasmussen, MD 05/08/2019, 11:49 AM

## 2019-05-08 NOTE — Progress Notes (Signed)
Patient discharged per MD order. Belongings returned and prescriptions provided. Discharge instructions provided and patient verbalized understanding. No sign of distress upon discharge.

## 2019-05-08 NOTE — H&P (Signed)
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  Day before yesterday patient says he got into a "debate" with his mother.  This was apparently in the morning of December 9.  He admits that he had been drinking a significant amount of vodka already in the morning.  He says that when he gets into an argument sometimes he will get loud but that he never put his hands on his mother was not aggressive and did not make any threats.  Patient does recall that when he came into the emergency room he may have made comments about believing his mother was trying to kill him.  He tells me he did not actually literally think she was trying to kill him but was upset at her making him come to the hospital.  On interview today the patient denies any auditory or visual hallucinations and does not show signs of any paranoid or psychotic thinking.  He admits that he drinks alcohol regularly and with some encouragement is able to identify that it is causing some problems for him.  He denies using any other drugs recently.  Patient denies feeling depressed denies suicidal or homicidal ideation.  All his behavior has been calm and appropriate here on the ward.  He is not showing any signs of alcohol withdrawal.  He does have follow-up treatment with [facility] but admits that he has not been taking the medicine they gave him regularly because he thinks it makes him feel too sleepy. Associated Signs/Symptoms: Depression Symptoms:  difficulty concentrating, anxiety, (Hypo) Manic Symptoms:  Distractibility, Anxiety  Symptoms:  Nonspecific Psychotic Symptoms:  Denies any PTSD Symptoms: Negative Total Time spent with patient: 1 hour  Past Psychiatric History: Patient has been seen once previously by me in the emergency room.  Circumstances at that time were almost identical.  Patient had been reported to be confused and possibly paranoid while intoxicated but by the time I talked to him he seemed pretty much normal.  He has been prescribed Risperdal by [facility].  He thinks he is on another medicine as well but does not know what it is.  There are no history of suicide attempts or physical violence noted.  He has no history of alcohol withdrawal symptoms.  Is the patient at risk to self? No.  Has the patient been a risk to self in the past 6 months? No.  Has the patient been a risk to self within the distant past? No.  Is the patient a risk to others? No.  Has the patient been a risk to others in the past 6 months? No.  Has the patient been a risk to others within the distant past? No.   Prior Inpatient Therapy:   Prior Outpatient Therapy:    Alcohol Screening: Patient refused Alcohol Screening Tool: Yes 1. How often do you have a drink containing alcohol?: 2 to 3 times a week 2. How many drinks containing alcohol do you have on a typical day when you are drinking?: 1 or 2 3. How often do you have six or more drinks on one occasion?: Never AUDIT-C Score: 3 4. How often during  the last year have you found that you were not able to stop drinking once you had started?: Never 5. How often during the last year have you failed to do what was normally expected from you becasue of drinking?: Never 6. How often during the last year have you needed a first drink in the morning to get yourself going after a heavy drinking session?: Never 7. How often during the last year have you had a feeling of guilt of remorse after drinking?: Never 8. How often during the last year have you been unable to remember what happened the  night before because you had been drinking?: Never 9. Have you or someone else been injured as a result of your drinking?: No 10. Has a relative or friend or a doctor or another health worker been concerned about your drinking or suggested you cut down?: No Alcohol Use Disorder Identification Test Final Score (AUDIT): 3 Alcohol Brief Interventions/Follow-up: AUDIT Score <7 follow-up not indicated Substance Abuse History in the last 12 months:  Yes.   Consequences of Substance Abuse: Has resulted in him coming to the emergency room at least twice and having serious conflicts with his mother and family at home Previous Psychotropic Medications: Yes  Psychological Evaluations: Yes  Past Medical History:  Past Medical History:  Diagnosis Date  . Asthma    History reviewed. No pertinent surgical history. Family History: History reviewed. No pertinent family history. Family Psychiatric  History: Patient reports that his biological father has had a problem with alcohol and that there are other men on both sides of the family who have had problems with drinking. Tobacco Screening: Have you used any form of tobacco in the last 30 days? (Cigarettes, Smokeless Tobacco, Cigars, and/or Pipes): Yes Tobacco use, Select all that apply: 5 or more cigarettes per day Are you interested in Tobacco Cessation Medications?: No, patient refused Counseled patient on smoking cessation including recognizing danger situations, developing coping skills and basic information about quitting provided: Refused/Declined practical counseling Social History:  Social History   Substance and Sexual Activity  Alcohol Use No     Social History   Substance and Sexual Activity  Drug Use Not Currently    Additional Social History: Marital status: Single Are you sexually active?: No Does patient have children?: No                         Allergies:  No Known Allergies Lab Results:  Results for orders placed or  performed during the hospital encounter of 05/07/19 (from the past 48 hour(s))  Hepatic function panel     Status: None   Collection Time: 05/08/19  7:17 AM  Result Value Ref Range   Total Protein 7.5 6.5 - 8.1 g/dL   Albumin 3.8 3.5 - 5.0 g/dL   AST 22 15 - 41 U/L   ALT 14 0 - 44 U/L   Alkaline Phosphatase 64 38 - 126 U/L   Total Bilirubin 1.0 0.3 - 1.2 mg/dL   Bilirubin, Direct 0.2 0.0 - 0.2 mg/dL   Indirect Bilirubin 0.8 0.3 - 0.9 mg/dL    Comment: Performed at [facility], [address] [place], [place] [address]  Lipid panel     Status: None   Collection Time: 05/08/19  7:17 AM  Result Value Ref Range   Cholesterol 161 0 - 200 mg/dL   Triglycerides 45 <150 mg/dL   HDL 96 >40 mg/dL   Total CHOL/HDL Ratio  1.7 RATIO   VLDL 9 0 - 40 mg/dL   LDL Cholesterol 56 0 - 99 mg/dL    Comment:        Total Cholesterol/HDL:CHD Risk Coronary Heart Disease Risk Table                     Men   Women  1/2 Average Risk   3.4   3.3  Average Risk       5.0   4.4  2 X Average Risk   9.6   7.1  3 X Average Risk  23.4   11.0        Use the calculated Patient Ratio above and the CHD Risk Table to determine the patient's CHD Risk.        ATP III CLASSIFICATION (LDL):  <100     mg/dL   Optimal  100-129  mg/dL   Near or Above                    Optimal  130-159  mg/dL   Borderline  160-189  mg/dL   High  >190     mg/dL   Very High Performed at [facility], [address] [place], [place] [address]   Magnesium     Status: None   Collection Time: 05/08/19  7:17 AM  Result Value Ref Range   Magnesium 2.2 1.7 - 2.4 mg/dL    Comment: Performed at [facility], [address] [place], [place] [address]  TSH     Status: None   Collection Time: 05/08/19  7:17 AM  Result Value Ref Range   TSH 1.830 0.350 - 4.500 uIU/mL    Comment: Performed by a 3rd Generation assay with a functional sensitivity of <=0.01 uIU/mL. Performed at [facility], [address] [place], [place] [address]     Blood Alcohol level:  Lab Results  Component Value Date   ETH 223 (H) 05/06/2019   ETH <10 93/81/0175    Metabolic Disorder Labs:  No results found for: HGBA1C, MPG No results found for: PROLACTIN Lab Results  Component Value Date   CHOL 161 05/08/2019   TRIG 45 05/08/2019   HDL 96 05/08/2019   CHOLHDL 1.7 05/08/2019   VLDL 9 05/08/2019   LDLCALC 56 05/08/2019    Current Medications: Current Facility-Administered Medications  Medication Dose Route Frequency Provider Last Rate Last Admin  . acetaminophen (TYLENOL) tablet 650 mg  650 mg Oral Q6H PRN Cristofano, Dorene Ar, MD      . alum & mag hydroxide-simeth (MAALOX/MYLANTA) 200-200-20 MG/5ML suspension 30 mL  30 mL Oral Q4H PRN Cristofano, Dorene Ar, MD      . benztropine (COGENTIN) tablet 0.5 mg  0.5 mg Oral QHS Captain Blucher T, MD      . diphenhydrAMINE (BENADRYL) capsule 50 mg  50 mg Oral Q6H PRN Cristofano, Dorene Ar, MD       Or  . diphenhydrAMINE (BENADRYL) injection 50 mg  50 mg Intramuscular Q6H PRN Cristofano, Paul A, MD      . haloperidol (HALDOL) tablet 5 mg  5 mg Oral Q6H PRN Cristofano, Dorene Ar, MD       Or  . haloperidol lactate (HALDOL) injection 5 mg  5 mg Intramuscular Q6H PRN Cristofano, Paul A, MD      . LORazepam (ATIVAN) tablet 2 mg  2 mg Oral Q4H PRN Cristofano, Dorene Ar, MD  Or  . LORazepam (ATIVAN) injection 2 mg  2 mg Intramuscular Q4H PRN Cristofano, Paul A, MD      . magnesium hydroxide (MILK OF MAGNESIA) suspension 30 mL  30 mL Oral Daily PRN Cristofano, Paul A, MD      . nicotine (NICODERM CQ - dosed in mg/24 hours) patch 21 mg  21 mg Transdermal Daily Khalee Mazo T, MD      . risperiDONE (RISPERDAL) tablet 2 mg  2 mg Oral QHS Kemal Amores T, MD       PTA Medications: No medications prior to admission.    Musculoskeletal: Strength & Muscle Tone: within normal limits Gait & Station: normal Patient leans: N/A  Psychiatric Specialty Exam: Physical  Exam  Nursing note and vitals reviewed. Constitutional: He appears well-developed and well-nourished.  HENT:  Head: Normocephalic and atraumatic.  Eyes: Pupils are equal, round, and reactive to light. Conjunctivae are normal.  Cardiovascular: Regular rhythm and normal heart sounds.  Respiratory: Effort normal. No respiratory distress.  GI: Soft.  Musculoskeletal:        General: Normal range of motion.     Cervical back: Normal range of motion.  Neurological: He is alert.  Skin: Skin is warm and dry.  Psychiatric: His speech is normal and behavior is normal. Judgment and thought content normal. His mood appears anxious. Cognition and memory are normal.    Review of Systems  Constitutional: Negative.   HENT: Negative.   Eyes: Negative.   Respiratory: Negative.   Cardiovascular: Negative.   Gastrointestinal: Negative.   Musculoskeletal: Negative.   Skin: Negative.   Neurological: Negative.   Psychiatric/Behavioral: Negative.     Blood pressure (!) 115/96, pulse 77, temperature 97.9 F (36.6 C), temperature source Oral, resp. rate 18, height '5\' 11"'  (1.803 m), weight 60.8 kg, SpO2 100 %.Body mass index is 18.69 kg/m.  General Appearance: Casual  Eye Contact:  Fair  Speech:  Clear and Coherent  Volume:  Normal  Mood:  Euthymic  Affect:  Congruent and Full Range  Thought Process:  Coherent  Orientation:  Full (Time, Place, and Person)  Thought Content:  Logical  Suicidal Thoughts:  No  Homicidal Thoughts:  No  Memory:  Immediate;   Fair Recent;   Fair Remote;   Fair  Judgement:  Fair  Insight:  Fair  Psychomotor Activity:  Normal  Concentration:  Concentration: Fair  Recall:  [employer] of Knowledge:  Fair  Language:  Fair  Akathisia:  No  Handed:  Right  AIMS (if indicated):     Assets:  Desire for Improvement Housing Physical Health Resilience Social Support  ADL's:  Intact  Cognition:  WNL  Sleep:  Number of Hours: 7.25    Treatment Plan Summary: Daily  contact with patient to assess and evaluate symptoms and progress in treatment, Medication management and Plan Young man who finished high school and is now working full-time at [employer].  He is able to talk very articulately about his anxiety at not knowing what direction he should go with his life in the future.  He does not however show any evidence currently of psychosis.  I would be very reluctant to suggest that this young man had schizophrenia.  He is holding down a full-time job and his behavior and his affect do not show any signs I can see of typical negative symptoms.  I suggested to him that if he stopped drinking a he could probably improve the majority of his mental health  problems and that therapy and perhaps the medicine would be helpful.  He was agreeable to that.  He already met with the representative from [place] this morning.  Patient was agreeable to restarting Risperdal low dose at night as well as a small dose of Cogentin.  I am not going to restart antidepressant or any other medicine at this time.  He will be pursued given prescriptions at discharged and referred back to [place].  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    Alethia Berthold, MD 12/11/202011:53 AM

## 2019-05-08 NOTE — BHH Suicide Risk Assessment (Signed)
[place] INPATIENT:  Family/Significant Other Suicide Prevention Education  Suicide Prevention Education:  Contact Attempts: Everlean Patterson, [place] peer support [phone] has been identified by the patient as the family member/significant other with whom the patient will be residing, and identified as the person(s) who will aid the patient in the event of a mental health crisis.  With written consent from the patient, two attempts were made to provide suicide prevention education, prior to and/or following the patient's discharge.  We were unsuccessful in providing suicide prevention education.  A suicide education pamphlet was given to the patient to share with family/significant other.  Date and time of first attempt: 05/08/2019 at 9:33AM Date and time of second attempt:  CSW left a HIPAA compliant voicemail requesting a call back.  [place] MSW LCSW 05/08/2019, 9:59 AM

## 2019-05-09 LAB — PROLACTIN: Prolactin: 22.8 ng/mL — ABNORMAL HIGH (ref 4.0–15.2)

## 2020-01-28 ENCOUNTER — Emergency Department
Admission: EM | Admit: 2020-01-28 | Discharge: 2020-01-30 | Disposition: A | Discharge status: Other Acute Inpatient Hospital | Payer: Medicaid Other | Attending: Emergency Medicine | Admitting: Emergency Medicine

## 2020-01-28 ENCOUNTER — Other Ambulatory Visit: Payer: Self-pay

## 2020-01-28 DIAGNOSIS — J45909 Unspecified asthma, uncomplicated: Secondary | ICD-10-CM | POA: Diagnosis not present

## 2020-01-28 DIAGNOSIS — F10959 Alcohol use, unspecified with alcohol-induced psychotic disorder, unspecified: Secondary | ICD-10-CM | POA: Diagnosis present

## 2020-01-28 DIAGNOSIS — F1924 Other psychoactive substance dependence with psychoactive substance-induced mood disorder: Secondary | ICD-10-CM | POA: Insufficient documentation

## 2020-01-28 DIAGNOSIS — F23 Brief psychotic disorder: Secondary | ICD-10-CM | POA: Diagnosis present

## 2020-01-28 DIAGNOSIS — F17200 Nicotine dependence, unspecified, uncomplicated: Secondary | ICD-10-CM | POA: Diagnosis not present

## 2020-01-28 DIAGNOSIS — F1010 Alcohol abuse, uncomplicated: Secondary | ICD-10-CM | POA: Diagnosis present

## 2020-01-28 DIAGNOSIS — Z20822 Contact with and (suspected) exposure to covid-19: Secondary | ICD-10-CM | POA: Insufficient documentation

## 2020-01-28 DIAGNOSIS — F4323 Adjustment disorder with mixed anxiety and depressed mood: Secondary | ICD-10-CM | POA: Diagnosis present

## 2020-01-28 DIAGNOSIS — F1210 Cannabis abuse, uncomplicated: Secondary | ICD-10-CM | POA: Diagnosis present

## 2020-01-28 DIAGNOSIS — Z79899 Other long term (current) drug therapy: Secondary | ICD-10-CM | POA: Insufficient documentation

## 2020-01-28 DIAGNOSIS — F919 Conduct disorder, unspecified: Secondary | ICD-10-CM | POA: Insufficient documentation

## 2020-01-28 DIAGNOSIS — F10929 Alcohol use, unspecified with intoxication, unspecified: Secondary | ICD-10-CM

## 2020-01-28 DIAGNOSIS — F322 Major depressive disorder, single episode, severe without psychotic features: Secondary | ICD-10-CM | POA: Diagnosis present

## 2020-01-28 DIAGNOSIS — F1020 Alcohol dependence, uncomplicated: Secondary | ICD-10-CM | POA: Diagnosis not present

## 2020-01-28 DIAGNOSIS — R4689 Other symptoms and signs involving appearance and behavior: Secondary | ICD-10-CM

## 2020-01-28 LAB — COMPREHENSIVE METABOLIC PANEL
ALT: 22 U/L (ref 0–44)
AST: 48 U/L — ABNORMAL HIGH (ref 15–41)
Albumin: 4.6 g/dL (ref 3.5–5.0)
Alkaline Phosphatase: 70 U/L (ref 38–126)
Anion gap: 12 (ref 5–15)
BUN: 14 mg/dL (ref 6–20)
CO2: 20 mmol/L — ABNORMAL LOW (ref 22–32)
Calcium: 8.8 mg/dL — ABNORMAL LOW (ref 8.9–10.3)
Chloride: 105 mmol/L (ref 98–111)
Creatinine, Ser: 1.23 mg/dL (ref 0.61–1.24)
GFR calc Af Amer: >60 mL/min (ref >60)
GFR calc non Af Amer: >60 mL/min (ref >60)
Glucose, Bld: 90 mg/dL (ref 70–99)
Potassium: 3.8 mmol/L (ref 3.5–5.1)
Sodium: 137 mmol/L (ref 135–145)
Total Bilirubin: 0.8 mg/dL (ref 0.3–1.2)
Total Protein: 8.2 g/dL — ABNORMAL HIGH (ref 6.5–8.1)

## 2020-01-28 LAB — CBC
HCT: 46.5 % (ref 39.0–52.0)
Hemoglobin: 16.1 g/dL (ref 13.0–17.0)
MCH: 32.2 pg (ref 26.0–34.0)
MCHC: 34.6 g/dL (ref 30.0–36.0)
MCV: 93 fL (ref 80.0–100.0)
Platelets: 280 10*3/uL (ref 150–400)
RBC: 5 MIL/uL (ref 4.22–5.81)
RDW: 13.2 % (ref 11.5–15.5)
WBC: 8.7 10*3/uL (ref 4.0–10.5)
nRBC: 0 % (ref 0.0–0.2)

## 2020-01-28 LAB — SARS CORONAVIRUS 2 BY RT PCR (HOSPITAL ORDER, PERFORMED IN ~~LOC~~ HOSPITAL LAB): SARS Coronavirus 2: NEGATIVE

## 2020-01-28 LAB — ETHANOL: Alcohol, Ethyl (B): 220 mg/dL — ABNORMAL HIGH (ref <10)

## 2020-01-28 NOTE — ED Triage Notes (Signed)
PT to ED in custody of [employer] under IVC, mother took out papers. PT was at home with family and was drinking, pt began throwing things like game controllers and cigarettes and family became concerned for their safety. PT appears intoxicated but in mostly cooperative at this time

## 2020-01-28 NOTE — ED Notes (Signed)
RED LIGHTER EAR PIECE  KEY ON LANYARD CIGARETTES VISA CARD RED SHIRT BLACK PANTS WITH BLACK BELT

## 2020-01-28 NOTE — ED Provider Notes (Signed)
[facility] Emergency Department Provider Note   ____________________________________________   I have reviewed the triage vital signs and the nursing notes.   HISTORY  Chief Complaint IVC   History limited by: Not Limited   HPI Ronald [name] is a 24 y.o. male who presents to the emergency department today under IVC because he says he does not feel he should be here.  He does admit to throwing a game console controller however he states that he was just demented on the bed and was not doing it at anyone.  He was however somewhat upset at one of his strep brothers at the time because stepbrother had mentioned his drinking.  Patient denies any thoughts of self-harm or harming others.  Patient denies any current medical complaints.  Records reviewed. Per medical record review patient has a history of asthma.  Past Medical History:  Diagnosis Date  . Asthma     Patient Active Problem List   Diagnosis Date Noted  . 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 Acute psychosis (HCC)   . MDD (major depressive disorder), severe (HCC) 08/12/2018  . Adjustment disorder with mixed anxiety and depressed mood 08/12/2018  . Cannabis abuse 08/12/2018  . Alcohol abuse 08/12/2018    No past surgical history on file.  Prior to Admission medications   Medication Sig Start Date End Date Taking? Authorizing Provider  benztropine (COGENTIN) 0.5 MG tablet Take 1 tablet (0.5 mg total) by mouth at bedtime. 05/08/19   Clapacs, Jackquline Denmark, MD  risperiDONE (RISPERDAL) 2 MG tablet Take 1 tablet (2 mg total) by mouth at bedtime. 05/08/19   Clapacs, Jackquline Denmark, MD    Allergies Patient has no known allergies.  No family history on file.  Social History Social History   Tobacco Use  . Smoking status: Heavy Tobacco Smoker    Packs/day: 0.50    Types: Cigarettes  . Smokeless tobacco: Never Used  Substance Use Topics  . Alcohol use: Yes  . Drug use: Not Currently    Review  of Systems Constitutional: No fever/chills Eyes: No visual changes. ENT: No sore throat. Cardiovascular: Denies chest pain. Respiratory: Denies shortness of breath. Gastrointestinal: No abdominal pain.  No nausea, no vomiting.  No diarrhea.   Genitourinary: Negative for dysuria. Musculoskeletal: Negative for back pain. Skin: Negative for rash. Neurological: Negative for headaches, focal weakness or numbness.  ____________________________________________   PHYSICAL EXAM:  VITAL SIGNS: ED Triage Vitals [01/28/20 2042]  Enc Vitals Group     BP      Pulse      Resp      Temp      Temp src      SpO2      Weight 150 lb (68 kg)     Height 5\' 5"  (1.651 m)     Head Circumference      Peak Flow      Pain Score 10   Constitutional: Alert and oriented.  Eyes: Conjunctivae are normal.  ENT      Head: Normocephalic and atraumatic.      Nose: No congestion/rhinnorhea.      Mouth/Throat: Mucous membranes are moist.      Neck: No stridor. Hematological/Lymphatic/Immunilogical: No cervical lymphadenopathy. Cardiovascular: Normal rate, regular rhythm.  No murmurs, rubs, or gallops.  Respiratory: Normal respiratory effort without tachypnea nor retractions. Breath sounds are clear and equal bilaterally. No wheezes/rales/rhonchi. Gastrointestinal: Soft and non tender. No rebound. No guarding.  Genitourinary: Deferred Musculoskeletal: Normal range of motion in  all extremities. No lower extremity edema. Neurologic:  Appears slightly intoxicated, gets off topic easily. Skin:  Skin is warm, dry and intact. No rash noted. Psychiatric: Denies SI/HI  ____________________________________________    LABS (pertinent positives/negatives)  Ethanol 220 CBC wbc 8.7, hgb 16.1, plt 280 CMP na 137, k 3.8, glu 90, cr 1.23  ____________________________________________   EKG  None  ____________________________________________     RADIOLOGY  None  ____________________________________________   PROCEDURES  Procedures  ____________________________________________   INITIAL IMPRESSION / ASSESSMENT AND PLAN / ED COURSE  Pertinent labs & imaging results that were available during my care of the patient were reviewed by me and considered in my medical decision making (see chart for details).   Patient presented to the emergency department today under IVC because of concerns for alcohol intoxication and aggressive behavior.  On exam patient does appear somewhat intoxicated has a hard time focusing on topics.  Does admit to throwing a game controller.  This time will continue IVC and have psychiatry evaluate.  The patient has been placed in psychiatric observation due to the need to provide a safe environment for the patient while obtaining psychiatric consultation and evaluation, as well as ongoing medical and medication management to treat the patient's condition.  The patient has been placed under full IVC at this time.  ___________________________________________   FINAL CLINICAL IMPRESSION(S) / ED DIAGNOSES  Final diagnoses:  Alcoholic intoxication with complication (HCC)  Abnormal behavior     Note: This dictation was prepared with Dragon dictation. Any transcriptional errors that result from this process are unintentional     Phineas Semen, MD 01/28/20 2255

## 2020-01-28 NOTE — ED Notes (Signed)
Pt. Transferred from Triage to room 20 after dressing out and screening for contraband. Report to include Situation, Background, Assessment and Recommendations from [facility]. Pt. Oriented to Quad including Q15 minute rounds as well as Psychologist, counselling for their protection. Patient is alert and oriented, warm and dry in no acute distress. Patient denies SI, HI, and AVH. Pt. Encouraged to let me know if needs arise.

## 2020-01-28 NOTE — ED Notes (Signed)
Hourly rounding reveals patient in room. No complaints, stable, in no acute distress. Q15 minute rounds and monitoring via Rover and Officer to continue.   

## 2020-01-29 LAB — URINE DRUG SCREEN, QUALITATIVE (ARMC ONLY)
Amphetamines, Ur Screen: NOT DETECTED
Barbiturates, Ur Screen: NOT DETECTED
Benzodiazepine, Ur Scrn: NOT DETECTED
Cannabinoid 50 Ng, Ur ~~LOC~~: POSITIVE — AB
Cocaine Metabolite,Ur ~~LOC~~: POSITIVE — AB
MDMA (Ecstasy)Ur Screen: NOT DETECTED
Methadone Scn, Ur: NOT DETECTED
Opiate, Ur Screen: NOT DETECTED
Phencyclidine (PCP) Ur S: NOT DETECTED
Tricyclic, Ur Screen: NOT DETECTED

## 2020-01-29 NOTE — ED Notes (Signed)
Pt given dinner tray and ginger ale. 

## 2020-01-29 NOTE — ED Notes (Signed)
Offered pt a shower.Pt refused

## 2020-01-29 NOTE — ED Notes (Signed)
Hourly rounding reveals patient in room. No complaints, stable, in no acute distress. Q15 minute rounds and monitoring via Rover and Officer to continue.   

## 2020-01-29 NOTE — Consult Note (Signed)
[facility] Face-to-Face Psychiatry Consult   Reason for Consult: IVC Referring Physician: Dr. Derrill Kay Patient Identification: Ronald [name] MRN:  [id number] Principal Diagnosis: <principal problem not specified> Diagnosis:  Active Problems:   MDD (major depressive disorder), severe (HCC)   Adjustment disorder with mixed anxiety and depressed mood   Cannabis abuse   Alcohol abuse   Acute psychosis (HCC)   Alcohol-induced psychosis (HCC)   Total Time spent with patient: 30 minutes  Subjective: " I want to get out of here and go home to get my things and leave. Ronald [name] is a 24 y.o. male patient presented to [facility] ED via law enforcement under law enforcement (IVC). Per the ED triage nurse note, the patient was brought to the ED in [employer] custody under IVC by his mother, who took out IVC papers on the patient due to his behaviors. The patient was at home with his family and was drinking, The patient began throwing things like game controllers and cigarettes, and the family became concerned for their safety. The patient is presenting to be intoxicated but is cooperative primarily at this time. The patient's alcohol is 220 mg/dl on 4.7.65 @2050 , and on 9.3.21@0917 , the patient is positive for Cocaine and Cannabinoid.   The patient reports that he is into altercations with family members, co-workers, and people in his neighborhood all of the time. He voiced that all of these people are the ones who are initiating the verbal abuse, and sometimes he has to fight them to protect himself. The patient was seen face-to-face by this provider; the chart was reviewed and consulted with Dr. 11.3.21 on 01/29/2020 due to the patient's care. It was discussed with the EDP that the patient does meet the criteria to be admitted to the psychiatric inpatient unit.  On evaluation, the patient is alert and oriented x 4, irritated, anxious, but cooperative, and mood-congruent with affect. The patient does not appear to be  responding to internal or external stimuli. Neither is the patient presenting with any delusional thinking. The patient denies auditory or visual hallucinations. The patient denies any suicidal, homicidal, or self-harm ideations. The patient is presenting with some psychotic and paranoid behaviors. He believes that everyone is out to get him.  These behaviors placed him in an unsafe situation. The patient is refusing all medications. He is voicing that he does not need medications. He is currently med non-compliant.  During an encounter with the patient, he was able to participate appropriately in the assessment process.  HPI: Per Dr. 03/30/2020: Ronald [name] is a 24 y.o. male who presents to the emergency department today under IVC because he says he does not feel he should be here.  He does admit to throwing a game console controller however he states that he was just demented on the bed and was not doing it at anyone.  He was however somewhat upset at one of his strep brothers at the time because stepbrother had mentioned his drinking.  Patient denies any thoughts of self-harm or harming others.  Patient denies any current medical complaints.  Past Psychiatric History:   Risk to Self: Suicidal Ideation: No Suicidal Intent: No Is patient at risk for suicide?: No Suicidal Plan?: No Access to Means: No What has been your use of drugs/alcohol within the last 12 months?: Alcohol How many times?: 0 Other Self Harm Risks: None Triggers for Past Attempts: None known Intentional Self Injurious Behavior: None Risk to Others: Homicidal Ideation: No Thoughts of Harm to Others: No Current  Homicidal Intent: No Current Homicidal Plan: No Access to Homicidal Means: No Identified Victim: None History of harm to others?: No Assessment of Violence: None Noted Violent Behavior Description: None Does patient have access to weapons?: No Criminal Charges Pending?: No Does patient have a court date: No Prior  Inpatient Therapy: Prior Inpatient Therapy: Yes Prior Therapy Dates: 05/07/2019, 08/12/2018 Prior Therapy Facilty/Provider(s): [facility] Reason for Treatment: Alcohol Abuse, Aggressive Behavior Prior Outpatient Therapy: Prior Outpatient Therapy: No Does patient have an ACCT team?: No Does patient have Intensive In-House Services?  : No Does patient have [facility] services? : No Does patient have P4CC services?: No  Past Medical History:  Past Medical History:  Diagnosis Date  . Asthma    No past surgical history on file. Family History: No family history on file. Family Psychiatric  History: Social History:  Social History   Substance and Sexual Activity  Alcohol Use Yes     Social History   Substance and Sexual Activity  Drug Use Not Currently    Social History   Socioeconomic History  . Marital status: Single    Spouse name: Not on file  . Number of children: Not on file  . Years of education: Not on file  . Highest education level: Not on file  Occupational History  . Not on file  Tobacco Use  . Smoking status: Heavy Tobacco Smoker    Packs/day: 0.50    Types: Cigarettes  . Smokeless tobacco: Never Used  Substance and Sexual Activity  . Alcohol use: Yes  . Drug use: Not Currently  . Sexual activity: Never  Other Topics Concern  . Not on file  Social History Narrative  . Not on file   Social Determinants of Health   Financial Resource Strain:   . Difficulty of Paying Living Expenses: Not on file  Food Insecurity:   . Worried About Programme researcher, broadcasting/film/video in the Last Year: Not on file  . Ran Out of Food in the Last Year: Not on file  Transportation Needs:   . Lack of Transportation (Medical): Not on file  . Lack of Transportation (Non-Medical): Not on file  Physical Activity:   . Days of Exercise per Week: Not on file  . Minutes of Exercise per Session: Not on file  Stress:   . Feeling of Stress : Not on file  Social Connections:   . Frequency of  Communication with Friends and Family: Not on file  . Frequency of Social Gatherings with Friends and Family: Not on file  . Attends Religious Services: Not on file  . Active Member of Clubs or Organizations: Not on file  . Attends Banker Meetings: Not on file  . Marital Status: Not on file   Additional Social History:    Allergies:  No Known Allergies  Labs:  Results for orders placed or performed during the hospital encounter of 01/28/20 (from the past 48 hour(s))  Comprehensive metabolic panel     Status: Abnormal   Collection Time: 01/28/20  8:52 PM  Result Value Ref Range   Sodium 137 135 - 145 mmol/L   Potassium 3.8 3.5 - 5.1 mmol/L   Chloride 105 98 - 111 mmol/L   CO2 20 (L) 22 - 32 mmol/L   Glucose, Bld 90 70 - 99 mg/dL    Comment: Glucose reference range applies only to samples taken after fasting for at least 8 hours.   BUN 14 6 - 20 mg/dL   Creatinine,  Ser 1.23 0.61 - 1.24 mg/dL   Calcium 8.8 (L) 8.9 - 10.3 mg/dL   Total Protein 8.2 (H) 6.5 - 8.1 g/dL   Albumin 4.6 3.5 - 5.0 g/dL   AST 48 (H) 15 - 41 U/L   ALT 22 0 - 44 U/L   Alkaline Phosphatase 70 38 - 126 U/L   Total Bilirubin 0.8 0.3 - 1.2 mg/dL   GFR calc non Af Amer >60 >60 mL/min   GFR calc Af Amer >60 >60 mL/min   Anion gap 12 5 - 15    Comment: Performed at [facility], [address] [place], [place] [address]  Ethanol     Status: Abnormal   Collection Time: 01/28/20  8:52 PM  Result Value Ref Range   Alcohol, Ethyl (B) 220 (H) <10 mg/dL    Comment: (NOTE) Lowest detectable limit for serum alcohol is 10 mg/dL.  For medical purposes only. Performed at [facility], [address] [place], [place] [address]   cbc     Status: None   Collection Time: 01/28/20  8:52 PM  Result Value Ref Range   WBC 8.7 4.0 - 10.5 K/uL   RBC 5.00 4.22 - 5.81 MIL/uL   Hemoglobin 16.1 13.0 - 17.0 g/dL   HCT 75.6 39 - 52 %   MCV 93.0 80.0 - 100.0 fL   MCH 32.2 26.0 - 34.0 pg    MCHC 34.6 30.0 - 36.0 g/dL   RDW 43.3 29.5 - 18.8 %   Platelets 280 150 - 400 K/uL   nRBC 0.0 0.0 - 0.2 %    Comment: Performed at [facility], [address], [place], [place] [address]  SARS Coronavirus 2 by RT PCR (hospital order, performed in [facility] hospital lab) Nasopharyngeal Nasopharyngeal Swab     Status: None   Collection Time: 01/28/20 10:18 PM   Specimen: Nasopharyngeal Swab  Result Value Ref Range   SARS Coronavirus 2 NEGATIVE NEGATIVE    Comment: (NOTE) SARS-CoV-2 target nucleic acids are NOT DETECTED.  The SARS-CoV-2 RNA is generally detectable in upper and lower respiratory specimens during the acute phase of infection. The lowest concentration of SARS-CoV-2 viral copies this assay can detect is 250 copies / mL. A negative result does not preclude SARS-CoV-2 infection and should not be used as the sole basis for treatment or other patient management decisions.  A negative result may occur with improper specimen collection / handling, submission of specimen other than nasopharyngeal swab, presence of viral mutation(s) within the areas targeted by this assay, and inadequate number of viral copies (<250 copies / mL). A negative result must be combined with clinical observations, patient history, and epidemiological information.  Fact Sheet for Patients:   BoilerBrush.com.cy  Fact Sheet for Healthcare Providers: https://pope.com/  This test is not yet approved or  cleared by the [place] FDA and has been authorized for detection and/or diagnosis of SARS-CoV-2 by FDA under an Emergency Use Authorization (EUA).  This EUA will remain in effect (meaning this test can be used) for the duration of the COVID-19 declaration under Section 564(b)(1) of the Act, 21 U.S.C. section 360bbb-3(b)(1), unless the authorization is terminated or revoked sooner.  Performed at [facility], [address],  [place], [place] [address]   Urine Drug Screen, Qualitative     Status: Abnormal   Collection Time: 01/29/20  8:34 AM  Result Value Ref Range   Tricyclic, Ur Screen NONE DETECTED NONE DETECTED  Amphetamines, Ur Screen NONE DETECTED NONE DETECTED   MDMA (Ecstasy)Ur Screen NONE DETECTED NONE DETECTED   Cocaine Metabolite,Ur [place] POSITIVE (A) NONE DETECTED   Opiate, Ur Screen NONE DETECTED NONE DETECTED   Phencyclidine (PCP) Ur S NONE DETECTED NONE DETECTED   Cannabinoid 50 Ng, Ur [place] POSITIVE (A) NONE DETECTED   Barbiturates, Ur Screen NONE DETECTED NONE DETECTED   Benzodiazepine, Ur Scrn NONE DETECTED NONE DETECTED   Methadone Scn, Ur NONE DETECTED NONE DETECTED    Comment: (NOTE) Tricyclics + metabolites, urine    Cutoff 1000 ng/mL Amphetamines + metabolites, urine  Cutoff 1000 ng/mL MDMA (Ecstasy), urine              Cutoff 500 ng/mL Cocaine Metabolite, urine          Cutoff 300 ng/mL Opiate + metabolites, urine        Cutoff 300 ng/mL Phencyclidine (PCP), urine         Cutoff 25 ng/mL Cannabinoid, urine                 Cutoff 50 ng/mL Barbiturates + metabolites, urine  Cutoff 200 ng/mL Benzodiazepine, urine              Cutoff 200 ng/mL Methadone, urine                   Cutoff 300 ng/mL  The urine drug screen provides only a preliminary, unconfirmed analytical test result and should not be used for non-medical purposes. Clinical consideration and professional judgment should be applied to any positive drug screen result due to possible interfering substances. A more specific alternate chemical method must be used in order to obtain a confirmed analytical result. Gas chromatography / mass spectrometry (GC/MS) is the preferred confirm atory method. Performed at [facility], [address] [place], [place] [address]     No current facility-administered medications for this encounter.   Current Outpatient Medications  Medication Sig Dispense Refill  . benztropine  (COGENTIN) 0.5 MG tablet Take 1 tablet (0.5 mg total) by mouth at bedtime. 30 tablet 0  . risperiDONE (RISPERDAL) 2 MG tablet Take 1 tablet (2 mg total) by mouth at bedtime. 30 tablet 0    Musculoskeletal: Strength & Muscle Tone: within normal limits Gait & Station: normal Patient leans: N/A  Psychiatric Specialty Exam: Physical Exam Psychiatric:        Attention and Perception: Perception normal. He is inattentive.        Mood and Affect: Mood is depressed. Affect is angry and inappropriate.        Speech: Speech is rapid and pressured and tangential.        Behavior: Behavior is agitated and hyperactive.        Thought Content: Thought content is delusional.        Cognition and Memory: Cognition and memory normal.        Judgment: Judgment is impulsive.     Review of Systems  Psychiatric/Behavioral: Positive for agitation and behavioral problems. The patient is nervous/anxious and is hyperactive.   All other systems reviewed and are negative.   Blood pressure 126/71, pulse 63, temperature 98.6 F (37 C), temperature source Oral, resp. rate 16, height 5\' 5"  (1.651 m), weight 68 kg, SpO2 100 %.Body mass index is 24.96 kg/m.  General Appearance: Bizarre and Disheveled  Eye Contact:  Fair  Speech:  Pressured  Volume:  Normal  Mood:  Angry, Anxious and Irritable  Affect:  Congruent  Thought Process:  Coherent  Orientation:  Full (Time, Place, and Person)  Thought Content:  Illogical, Rumination and Tangential  Suicidal Thoughts:  No  Homicidal Thoughts:  No  Memory:  Immediate;   Good Recent;   Good Remote;   Good  Judgement:  Poor  Insight:  Lacking  Psychomotor Activity:  Normal  Concentration:  Concentration: Good and Attention Span: Good  Recall:  Good  Fund of Knowledge:  Good  Language:  Fair  Akathisia:  Negative  Handed:  Right  AIMS (if indicated):     Assets:  ArchitectCommunication Skills Financial Resources/Insurance Housing Resilience Social Support  ADL's:   Intact  Cognition:  WNL  Sleep:    Insomnia     Treatment Plan Summary: Medication management and Plan The patient is a safety risk to self and requires psychiatric inpatient admission for stabilization and treatment. Restart the patient on Cogentin 0.5 mg at bedtime and Risperdal 2 mg at bedtime  Disposition: Recommend psychiatric Inpatient admission when medically cleared. Supportive therapy provided about ongoing stressors.  Gillermo MurdochJacqueline Briston Lax, NP 01/29/2020 11:35 AM

## 2020-01-29 NOTE — ED Notes (Signed)
Pt ate all of breakfast. No other needs at this time

## 2020-01-29 NOTE — ED Notes (Signed)
Psych NP at bedside

## 2020-01-29 NOTE — BH Assessment (Signed)
Referral check for Psychiatric Hospitalization:    Ronald [name] ([phone]- [phone]), 8:26 PM  Ronald [name] reported patient was denied due to behaviors.    Ronald [name] [phone], [phone] or [phone]), 8:36 PM No answer    [place] [phone] or [phone])10:44 Ronald [name] requested a referral refax. Task completed at 10:49 PM. -5:45 am Left message for a return call.     Ronald [name] [phone]), 10:51 PM Per Ronald [name], referrals have yet to be reviewed.  -5:44 am Ronald [name]     [name] [phone] -or- [phone]), 10:52 PM Ronald [name] report Declined due to chronicity.    Ronald [name] [phone]). 10:54 PM Left voicemail for a return call.

## 2020-01-29 NOTE — ED Provider Notes (Signed)
Emergency Medicine Observation Re-evaluation Note  Ronald [name] is a 24 y.o. male, seen on rounds today.  Pt initially presented to the ED for complaints of IVC Currently, the patient is resting without complaints.  Physical Exam  BP 118/66 (BP Location: Right Arm)   Pulse (!) 101   Temp 98.2 F (36.8 C) (Oral)   Resp 16   Ht 5\' 5"  (1.651 m)   Wt 68 kg   SpO2 98%   BMI 24.96 kg/m  Physical Exam General: Resting in no acute distress Cardiac: No cyanosis Lungs: Equal rise and fall Psych: Stable  ED Course / MDM  EKG:    I have reviewed the labs performed to date as well as medications administered while in observation.  Recent changes in the last 24 hours include no changes overnight.  Plan  Current plan is for psychiatric evaluation once patient is sober. Patient is under full IVC at this time.   , MD 01/29/20 [phone]

## 2020-01-29 NOTE — BH Assessment (Signed)
Assessment Note  Ronald [name] is an 24 y.o. male presenting to [facility] ED under IVC given by his mother. Per triage note PT to ED in custody of BPD under IVC, mother took out papers. PT was at home with family and was drinking, pt began throwing things like game controllers and cigarettes and family became concerned for their safety. PT appears intoxicated but in mostly cooperative at this time. During assessment patient was alert and oriented x4, cooperative, speech was rapid and circumstantial at times, mood would go back and forth between irritable and labile displaying abrupt mood swings and expressing his frustration with his mother. When asked why patient was presenting to ED patient reported "I would love to go back to hell, if I go back to my mother's house." "I just had a little too much to drink, my mother doesn't like me drinking but her husband drinks too so that's contradictory." "I want to be far away from my mom and step dad, when I come home from work I just want my mom to leave me alone." Patient reported how much he drank tonight "I only had 1 beer." Patient BAL is currently 220. Patient denies current SI/HI/AH/VH and does not appear to be responding to any interna or external stimuli.  Patient disposition pending, to be seen by Psyc Provider  Diagnosis: Alcohol Use Disorder Severe, Substance-Induced Mood Disorder  Past Medical History:  Past Medical History:  Diagnosis Date   Asthma     No past surgical history on file.  Family History: No family history on file.  Social History:  reports that he has been smoking cigarettes. He has been smoking about 0.50 packs per day. He has never used smokeless tobacco. He reports current alcohol use. He reports previous drug use.  Additional Social History:  Alcohol / Drug Use Pain Medications: See MAR Prescriptions: See MAR Over the Counter: See MAR History of alcohol / drug use?: Yes Substance #1 Name of Substance 1:  Alcohol  CIWA: CIWA-Ar BP: 118/66 Pulse Rate: (!) 101 COWS:    Allergies: No Known Allergies  Home Medications: (Not in a hospital admission)   OB/GYN Status:  No LMP for male patient.  General Assessment Data Location of Assessment: [facility] ED TTS Assessment: In system Is this a Tele or Face-to-Face Assessment?: Face-to-Face Is this an Initial Assessment or a Re-assessment for this encounter?: Initial Assessment Patient Accompanied by:: N/A Language Other than English: No Living Arrangements: Other (Comment) What gender do you identify as?: Male Marital status: Single Pregnancy Status: No Living Arrangements: Parent Can pt return to current living arrangement?: Yes Admission Status: Involuntary Petitioner: Family member Is patient capable of signing voluntary admission?: No Referral Source: Other Insurance type: Medicaid  Medical Screening Exam [facility] Walk-in ONLY) Medical Exam completed: Yes  Crisis Care Plan Living Arrangements: Parent Legal Guardian: Other: (Self) Name of Psychiatrist: None Name of Therapist: None  Education Status Is patient currently in school?: No Is the patient employed, unemployed or receiving disability?: Employed  Risk to self with the past 6 months Suicidal Ideation: No Has patient been a risk to self within the past 6 months prior to admission? : No Suicidal Intent: No Has patient had any suicidal intent within the past 6 months prior to admission? : No Is patient at risk for suicide?: No Suicidal Plan?: No Has patient had any suicidal plan within the past 6 months prior to admission? : No Access to Means: No What has been your use of drugs/alcohol  within the last 12 months?: Alcohol Previous Attempts/Gestures: No How many times?: 0 Other Self Harm Risks: None Triggers for Past Attempts: None known Intentional Self Injurious Behavior: None Family Suicide History: No Recent stressful life event(s): Conflict (Comment), Other  (Comment) (Conflict with family) Persecutory voices/beliefs?: No Depression: No Substance abuse history and/or treatment for substance abuse?: Yes Suicide prevention information given to non-admitted patients: Not applicable  Risk to Others within the past 6 months Homicidal Ideation: No Does patient have any lifetime risk of violence toward others beyond the six months prior to admission? : No Thoughts of Harm to Others: No Current Homicidal Intent: No Current Homicidal Plan: No Access to Homicidal Means: No Identified Victim: None History of harm to others?: No Assessment of Violence: None Noted Violent Behavior Description: None Does patient have access to weapons?: No Criminal Charges Pending?: No Does patient have a court date: No Is patient on probation?: No  Psychosis Hallucinations: None noted Delusions: None noted  Mental Status Report Appearance/Hygiene: In scrubs Eye Contact: Fair Motor Activity: Freedom of movement, Agitation Speech: Rapid Level of Consciousness: Alert, Irritable Mood: Anxious, Labile, Irritable Affect: Labile Anxiety Level: Moderate Thought Processes: Circumstantial Judgement: Partial Orientation: Person, Place, Time, Situation Obsessive Compulsive Thoughts/Behaviors: None  Cognitive Functioning Concentration: Fair Memory: Recent Intact, Remote Intact Is patient IDD: No Insight: Poor Impulse Control: Poor Appetite: Good Have you had any weight changes? : No Change Sleep: No Change Total Hours of Sleep: 8 Vegetative Symptoms: None  ADLScreening [facility] Assessment Services) Patient's cognitive ability adequate to safely complete daily activities?: Yes Patient able to express need for assistance with ADLs?: Yes Independently performs ADLs?: Yes (appropriate for developmental age)  Prior Inpatient Therapy Prior Inpatient Therapy: Yes Prior Therapy Dates: 05/07/2019, 08/12/2018 Prior Therapy Facilty/Provider(s): [facility] Reason for  Treatment: Alcohol Abuse, Aggressive Behavior  Prior Outpatient Therapy Prior Outpatient Therapy: No Does patient have an ACCT team?: No Does patient have Intensive In-House Services?  : No Does patient have [facility] services? : No Does patient have P4CC services?: No  ADL Screening (condition at time of admission) Patient's cognitive ability adequate to safely complete daily activities?: Yes Is the patient deaf or have difficulty hearing?: No Does the patient have difficulty seeing, even when wearing glasses/contacts?: No Does the patient have difficulty concentrating, remembering, or making decisions?: No Patient able to express need for assistance with ADLs?: Yes Does the patient have difficulty dressing or bathing?: No Independently performs ADLs?: Yes (appropriate for developmental age) Does the patient have difficulty walking or climbing stairs?: No Weakness of Legs: None Weakness of Arms/Hands: None  Home Assistive Devices/Equipment Home Assistive Devices/Equipment: None  Therapy Consults (therapy consults require a physician order) PT Evaluation Needed: No OT Evalulation Needed: No SLP Evaluation Needed: No Abuse/Neglect Assessment (Assessment to be complete while patient is alone) Abuse/Neglect Assessment Can Be Completed: Yes Physical Abuse: Denies Verbal Abuse: Denies Sexual Abuse: Denies Exploitation of patient/patient's resources: Denies Self-Neglect: Denies Values / Beliefs Cultural Requests During Hospitalization: None Spiritual Requests During Hospitalization: None Consults Spiritual Care Consult Needed: No Transition of Care Team Consult Needed: No Advance Directives (For Healthcare) Does Patient Have a Medical Advance Directive?: No Would patient like information on creating a medical advance directive?: No - Guardian declined          Disposition: Patient disposition pending, to be seen by Psyc Provider Disposition Initial Assessment Completed for  this Encounter: Yes  On Site Evaluation by:   Reviewed with Physician:    Shaune Pascal  Tirsa Gail MS LCASA 01/29/2020 1:24 AM

## 2020-01-29 NOTE — ED Notes (Signed)
Pt transferred into ED BHU room 2    Patient assigned to appropriate care area. Patient oriented to unit/care area: Informed that, for his safety, care areas are designed for safety and monitored by security cameras at all times; Visiting hours and phone times explained to patient. Patient verbalizes understanding, and verbal contract for safety obtained.   Assessment completed  He denies pain  IVC  pending inpatient placement

## 2020-01-29 NOTE — ED Notes (Signed)
IVC, pend dispo, moved to [employer]

## 2020-01-29 NOTE — ED Notes (Signed)
Spoke with Ronald [name], TTS and informed him the mother called and would like to speak with psychiatry.  Pt does not wish to speak with mother and does not want this RN to speak with her about him at this time.

## 2020-01-29 NOTE — ED Notes (Signed)
Pt requesting to know plan of care, informed pt psych is planning to admit him.  Pt unhappy about this and not wanting to stay. Slightly agitated but remains cooperative at this time.

## 2020-01-29 NOTE — ED Notes (Signed)
Pt informed mother called per behavioral RN downstairs. Pt does not wish to call or speak with his mother.

## 2020-01-29 NOTE — BH Assessment (Signed)
Referral information for Psychiatric Hospitalization faxed to;   [facility] [place] [place] [phone]),   . [facility] [phone], [phone], [phone] or [phone]),   . [place] [phone] or [phone])  . [facility] [phone]),   . [facility] [phone] -or- [phone]),   . [facility] [phone]).

## 2020-01-30 NOTE — ED Notes (Signed)
Pt informed that his pending transfer will be delayed at this time  Pt requesting to still be seen by psychiatry today while holding

## 2020-01-30 NOTE — ED Provider Notes (Signed)
Emergency Medicine Observation Re-evaluation Note  Jamel Holzmann is a 24 y.o. male, seen on rounds today.  Pt initially presented to the ED for complaints of IVC Currently, the patient is laying in bed watching TV, denies complaints.  Physical Exam  BP 115/66 (BP Location: Right Arm)   Pulse 86   Temp 98.4 F (36.9 C) (Oral)   Resp 18   Ht 5\' 5"  (1.651 m)   Wt 68 kg   SpO2 97%   BMI 24.96 kg/m  Physical Exam Constitutional: Resting comfortably. Eyes: Conjunctivae are normal. Head: Atraumatic. Nose: No congestion/rhinnorhea. Mouth/Throat: Mucous membranes are moist. Neck: Normal ROM Cardiovascular: No cyanosis noted. Respiratory: Normal respiratory effort. Gastrointestinal: Non-distended. Genitourinary: deferred Musculoskeletal: No lower extremity tenderness nor edema. Neurologic:  Normal speech and language. No gross focal neurologic deficits are appreciated. Skin:  Skin is warm, dry and intact. No rash noted.    ED Course / MDM  EKG:    I have reviewed the labs performed to date as well as medications administered while in observation.  Recent changes in the last 24 hours include none.  Plan  Current plan is for transfer to [facility] today. Patient is under full IVC at this time.   [facility], MD 01/30/20 [phone]

## 2020-01-30 NOTE — ED Notes (Addendum)
Toothbrush and tooth paste provided  - shower offered but he declined at this time

## 2020-01-30 NOTE — ED Notes (Signed)
Awakened him to inform him of his pending transfer to [facility]    IVC, Plan of care discussed  VS obtained and assessment completed  Answered questions and concerns

## 2020-01-30 NOTE — ED Provider Notes (Signed)
-----------------------------------------   6:00 AM on 01/30/2020 -----------------------------------------  Patient accepted to [facility]   Irean Hong, MD 01/30/20 0600

## 2020-01-30 NOTE — ED Notes (Signed)
[facility] [place] called [phone] and informed me of no transport available at this time, maybe late this afternoon but unsure

## 2020-01-30 NOTE — ED Notes (Signed)
Called [facility]  to [facility]  [phone]

## 2020-01-30 NOTE — ED Notes (Signed)
Hourly rounding reveals patient lying in bed watching TV. No complaints, stable, in no acute distress. Q15 minute rounds and monitoring via Rover and Officer to continue. 

## 2020-01-30 NOTE — ED Notes (Signed)
Report to include Situation, Background, Assessment, and Recommendations received from Amy RN. Patient alert and oriented, warm and dry, in no acute distress. Patient made aware of Q15 minute rounds and security cameras for their safety. Patient instructed to come to me with needs or concerns. 

## 2020-01-30 NOTE — ED Notes (Signed)
Hourly rounding reveals patient lying in bed. No complaints, stable, in no acute distress. Q15 minute rounds and monitoring via Rover and Officer to continue. 

## 2020-01-30 NOTE — BH Assessment (Signed)
Patient has been accepted to [facility].  Patient assigned to [facility] Accepting physician is Dr. Estill Cotta .  Call report to [phone].  Representative was [employer].   ER Staff is aware of it:  Lynden Ang, ER Secretary  Dr. Dolores Frame, ER MD  Thurston Hole Patient's Nurse     Patient can transport after 8 am 01/30/20.

## 2020-03-18 ENCOUNTER — Encounter: Payer: Self-pay | Admitting: Physician Assistant

## 2020-03-18 ENCOUNTER — Ambulatory Visit: Payer: Medicaid Other | Admitting: Physician Assistant

## 2020-03-18 ENCOUNTER — Other Ambulatory Visit: Payer: Self-pay

## 2020-03-18 DIAGNOSIS — Z113 Encounter for screening for infections with a predominantly sexual mode of transmission: Secondary | ICD-10-CM | POA: Diagnosis not present

## 2020-03-18 DIAGNOSIS — Z202 Contact with and (suspected) exposure to infections with a predominantly sexual mode of transmission: Secondary | ICD-10-CM

## 2020-03-18 LAB — GRAM STAIN

## 2020-03-18 MED ORDER — DOXYCYCLINE HYCLATE 100 MG PO TABS: 100.0000 mg | ORAL_TABLET | Freq: Two times a day (BID) | ORAL | 0 refills | Status: AC

## 2020-03-18 MED ORDER — CEFTRIAXONE SODIUM 500 MG IJ SOLR
500.0000 mg | Freq: Once | INTRAMUSCULAR | Status: AC
  Administered 2020-03-18: 500 mg via INTRAMUSCULAR

## 2020-03-18 NOTE — Progress Notes (Signed)
[facility] Department STI clinic/screening visit  Subjective:  Ronald [name] is a 24 y.o. male being seen today for an STI screening visit. The patient reports they do have symptoms.    Patient has the following medical conditions:   Patient Active Problem List   Diagnosis Date Noted  . 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 Acute psychosis (HCC)   . MDD (major depressive disorder), severe (HCC) 08/12/2018  . Adjustment disorder with mixed anxiety and depressed mood 08/12/2018  . Cannabis abuse 08/12/2018  . Alcohol abuse 08/12/2018     Chief Complaint  Patient presents with  . SEXUALLY TRANSMITTED DISEASE    screening    HPI  Patient reports that he has had a "wart-like" area on his scrotum, under his penis for about 1 week.  Also, slight dysuria for 2 days and is a contact to [facility].  States last HIV test was about 1 year ago and last void prior to sample collection was about 2 hr ago.   See flowsheet for further details and programmatic requirements.    The following portions of the patient's history were reviewed and updated as appropriate: allergies, current medications, past medical history, past social history, past surgical history and problem list.  Objective:  There were no vitals filed for this visit.  Physical Exam Constitutional:      General: He is not in acute distress.    Appearance: Normal appearance.  HENT:     Head: Normocephalic and atraumatic.     Comments: No nits,lice, or hair loss. No cervical, supraclavicular or axillary adenopathy.    Mouth/Throat:     Mouth: Mucous membranes are moist.     Pharynx: Oropharynx is clear. No oropharyngeal exudate or posterior oropharyngeal erythema.  Eyes:     Conjunctiva/sclera: Conjunctivae normal.  Pulmonary:     Effort: Pulmonary effort is normal.  Abdominal:     Palpations: Abdomen is soft. There is no mass.     Tenderness: There is no abdominal tenderness. There is no guarding or  rebound.  Genitourinary:    Penis: Normal.      Testes: Normal.     Comments: Pubic area without nits, lice, hair loss, edema, erythema, lesions and inguinal adenopathy. Penis circumcised without rash, lesions and discharge at meatus. On R side of scrotum at base of penis 1 ~1cm open, macular ulcerative lesion, small amount of exudate, slight tenderness to culture, smooth, flat borders. Musculoskeletal:     Cervical back: Neck supple. No tenderness.  Skin:    General: Skin is warm and dry.     Findings: No bruising, erythema, lesion or rash.  Neurological:     Mental Status: He is alert and oriented to person, place, and time.  Psychiatric:        Mood and Affect: Mood normal.        Behavior: Behavior normal.        Thought Content: Thought content normal.        Judgment: Judgment normal.       Assessment and Plan:  Daundre Biel is a 24 y.o. male presenting to the [facility] Department for STI screening  1. Screening for STD (sexually transmitted disease) Patient into clinic with symptoms. Rec condoms with all sex. No sex until after lesion has healed and results are back.  Await test results.  Counseled that RN will call if needs to RTC for treatment once results are back. - Gram stain - Gonococcus culture - HBV  Antigen/Antibody State Lab - HIV/HCV [place] [facility] - Syphilis Serology, [place] [facility] - Virology, [place] [facility]  2. Gonorrhea contact Will treat as a contact to [facility] with Ceftriaxone 500 mg IM and Doxycycline 100 mg #14 1 po BID for 7 days to cover for Chlamydia. No sex for 10 days and until after partner completes treatment. - cefTRIAXone (ROCEPHIN) injection 500 mg - doxycycline (VIBRA-TABS) 100 MG tablet; Take 1 tablet (100 mg total) by mouth 2 (two) times daily for 7 days.  Dispense: 14 tablet; Refill: 0     No follow-ups on file.  No future appointments.  Matt Holmes, PA

## 2020-03-18 NOTE — Progress Notes (Signed)
Post: Gram stain reviewed with patient. RN tx for gonorrhea as a contact. Provider orders complete.  Harvie Heck, RN

## 2020-03-22 LAB — GONOCOCCUS CULTURE

## 2020-05-30 DIAGNOSIS — R451 Restlessness and agitation: Secondary | ICD-10-CM | POA: Insufficient documentation

## 2020-05-30 DIAGNOSIS — F332 Major depressive disorder, recurrent severe without psychotic features: Secondary | ICD-10-CM | POA: Insufficient documentation

## 2020-05-30 DIAGNOSIS — F10159 Alcohol abuse with alcohol-induced psychotic disorder, unspecified: Secondary | ICD-10-CM | POA: Insufficient documentation

## 2020-05-30 DIAGNOSIS — Z20822 Contact with and (suspected) exposure to covid-19: Secondary | ICD-10-CM | POA: Diagnosis not present

## 2020-05-30 DIAGNOSIS — F23 Brief psychotic disorder: Secondary | ICD-10-CM | POA: Diagnosis not present

## 2020-05-30 DIAGNOSIS — F4323 Adjustment disorder with mixed anxiety and depressed mood: Secondary | ICD-10-CM | POA: Insufficient documentation

## 2020-05-30 DIAGNOSIS — Z79899 Other long term (current) drug therapy: Secondary | ICD-10-CM | POA: Diagnosis not present

## 2020-05-30 DIAGNOSIS — J45909 Unspecified asthma, uncomplicated: Secondary | ICD-10-CM | POA: Diagnosis not present

## 2020-05-30 DIAGNOSIS — Z046 Encounter for general psychiatric examination, requested by authority: Secondary | ICD-10-CM | POA: Diagnosis present

## 2020-05-30 DIAGNOSIS — F17210 Nicotine dependence, cigarettes, uncomplicated: Secondary | ICD-10-CM | POA: Diagnosis not present

## 2020-05-31 ENCOUNTER — Other Ambulatory Visit: Payer: Self-pay

## 2020-05-31 ENCOUNTER — Emergency Department
Admission: EM | Admit: 2020-05-31 | Discharge: 2020-05-31 | Disposition: A | Discharge status: Other Acute Inpatient Hospital | Payer: No Typology Code available for payment source | Attending: Emergency Medicine | Admitting: Emergency Medicine

## 2020-05-31 DIAGNOSIS — F23 Brief psychotic disorder: Secondary | ICD-10-CM | POA: Diagnosis not present

## 2020-05-31 DIAGNOSIS — F4323 Adjustment disorder with mixed anxiety and depressed mood: Secondary | ICD-10-CM | POA: Diagnosis present

## 2020-05-31 DIAGNOSIS — F10959 Alcohol use, unspecified with alcohol-induced psychotic disorder, unspecified: Secondary | ICD-10-CM | POA: Diagnosis present

## 2020-05-31 DIAGNOSIS — F322 Major depressive disorder, single episode, severe without psychotic features: Secondary | ICD-10-CM | POA: Diagnosis present

## 2020-05-31 DIAGNOSIS — F29 Unspecified psychosis not due to a substance or known physiological condition: Secondary | ICD-10-CM

## 2020-05-31 LAB — CBC WITH DIFFERENTIAL/PLATELET
Abs Immature Granulocytes: 0.07 10*3/uL (ref 0.00–0.07)
Basophils Absolute: 0 10*3/uL (ref 0.0–0.1)
Basophils Relative: 0 %
Eosinophils Absolute: 0 10*3/uL (ref 0.0–0.5)
Eosinophils Relative: 0 %
HCT: 46.5 % (ref 39.0–52.0)
Hemoglobin: 15.4 g/dL (ref 13.0–17.0)
Immature Granulocytes: 0 %
Lymphocytes Relative: 6 %
Lymphs Abs: 1.1 10*3/uL (ref 0.7–4.0)
MCH: 31.7 pg (ref 26.0–34.0)
MCHC: 33.1 g/dL (ref 30.0–36.0)
MCV: 95.7 fL (ref 80.0–100.0)
Monocytes Absolute: 1.2 10*3/uL — ABNORMAL HIGH (ref 0.1–1.0)
Monocytes Relative: 7 %
Neutro Abs: 15.1 10*3/uL — ABNORMAL HIGH (ref 1.7–7.7)
Neutrophils Relative %: 87 %
Platelets: 279 10*3/uL (ref 150–400)
RBC: 4.86 MIL/uL (ref 4.22–5.81)
RDW: 12.3 % (ref 11.5–15.5)
WBC: 17.5 10*3/uL — ABNORMAL HIGH (ref 4.0–10.5)
nRBC: 0 % (ref 0.0–0.2)

## 2020-05-31 LAB — COMPREHENSIVE METABOLIC PANEL
ALT: 42 U/L (ref 0–44)
AST: 184 U/L — ABNORMAL HIGH (ref 15–41)
Albumin: 4.6 g/dL (ref 3.5–5.0)
Alkaline Phosphatase: 61 U/L (ref 38–126)
Anion gap: 14 (ref 5–15)
BUN: 21 mg/dL — ABNORMAL HIGH (ref 6–20)
CO2: 24 mmol/L (ref 22–32)
Calcium: 9.6 mg/dL (ref 8.9–10.3)
Chloride: 102 mmol/L (ref 98–111)
Creatinine, Ser: 1.16 mg/dL (ref 0.61–1.24)
GFR, Estimated: >60 mL/min (ref >60)
Glucose, Bld: 75 mg/dL (ref 70–99)
Potassium: 3.4 mmol/L — ABNORMAL LOW (ref 3.5–5.1)
Sodium: 140 mmol/L (ref 135–145)
Total Bilirubin: 1.8 mg/dL — ABNORMAL HIGH (ref 0.3–1.2)
Total Protein: 8.4 g/dL — ABNORMAL HIGH (ref 6.5–8.1)

## 2020-05-31 LAB — URINE DRUG SCREEN, QUALITATIVE (ARMC ONLY)
Amphetamines, Ur Screen: POSITIVE — AB
Barbiturates, Ur Screen: NOT DETECTED
Benzodiazepine, Ur Scrn: POSITIVE — AB
Cannabinoid 50 Ng, Ur ~~LOC~~: POSITIVE — AB
Cocaine Metabolite,Ur ~~LOC~~: POSITIVE — AB
MDMA (Ecstasy)Ur Screen: NOT DETECTED
Methadone Scn, Ur: NOT DETECTED
Opiate, Ur Screen: NOT DETECTED
Phencyclidine (PCP) Ur S: NOT DETECTED
Tricyclic, Ur Screen: NOT DETECTED

## 2020-05-31 LAB — ETHANOL: Alcohol, Ethyl (B): <10 mg/dL (ref <10)

## 2020-05-31 LAB — ACETAMINOPHEN LEVEL: Acetaminophen (Tylenol), Serum: <10 ug/mL — ABNORMAL LOW (ref 10–30)

## 2020-05-31 LAB — SALICYLATE LEVEL: Salicylate Lvl: <7 mg/dL — ABNORMAL LOW (ref 7.0–30.0)

## 2020-05-31 LAB — RESP PANEL BY RT-PCR (FLU A&B, COVID) ARPGX2
Influenza A by PCR: NEGATIVE
Influenza B by PCR: NEGATIVE
SARS Coronavirus 2 by RT PCR: NEGATIVE

## 2020-05-31 MED ORDER — THIAMINE HCL 100 MG/ML IJ SOLN: 100.0000 mg | Freq: Every day | INTRAMUSCULAR | Status: DC

## 2020-05-31 MED ORDER — LORAZEPAM 2 MG/ML IJ SOLN: 0.0000 mg | Freq: Four times a day (QID) | INTRAMUSCULAR | Status: DC

## 2020-05-31 MED ORDER — HALOPERIDOL LACTATE 5 MG/ML IJ SOLN
5.0000 mg | Freq: Once | INTRAMUSCULAR | Status: AC
  Administered 2020-05-31: 5 mg via INTRAMUSCULAR
  Filled 2020-05-31: qty 1

## 2020-05-31 MED ORDER — LORAZEPAM 2 MG PO TABS: 0.0000 mg | ORAL_TABLET | Freq: Two times a day (BID) | ORAL | Status: DC

## 2020-05-31 MED ORDER — THIAMINE HCL 100 MG PO TABS
100.0000 mg | ORAL_TABLET | Freq: Every day | ORAL | Status: DC
  Administered 2020-05-31: 100 mg via ORAL
  Filled 2020-05-31: qty 1

## 2020-05-31 MED ORDER — LORAZEPAM 2 MG PO TABS: 0.0000 mg | ORAL_TABLET | Freq: Four times a day (QID) | ORAL | Status: DC

## 2020-05-31 MED ORDER — DIPHENHYDRAMINE HCL 50 MG/ML IJ SOLN
25.0000 mg | INTRAMUSCULAR | Status: AC
  Administered 2020-05-31: 25 mg via INTRAMUSCULAR
  Filled 2020-05-31: qty 1

## 2020-05-31 MED ORDER — LORAZEPAM 2 MG/ML IJ SOLN: 0.0000 mg | Freq: Two times a day (BID) | INTRAMUSCULAR | Status: DC

## 2020-05-31 MED ORDER — MIDAZOLAM HCL 2 MG/2ML IJ SOLN
2.0000 mg | Freq: Once | INTRAMUSCULAR | Status: AC
  Administered 2020-05-31: 2 mg via INTRAMUSCULAR
  Filled 2020-05-31: qty 2

## 2020-05-31 NOTE — ED Notes (Signed)
Hourly rounding completed at this time, patient currently asleep in room. No complaints, stable, and in no acute distress. Q15 minute rounds and monitoring via Rover and Officer to continue. 

## 2020-05-31 NOTE — BH Assessment (Addendum)
This writer was unable to assess pt due to his altered mental status. When asked what brought him to the hospital the pt stated, "I got a cut", gesturing towards his feet and his mouth. Pt then began to ramble incoherently. Pt presented with disorganized speech and was not oriented. Pt presented with a disheveled appearance and seemingly neglected hygiene. Pt's motor behavior was abnormal as he tossed and turned in the bed, with exaggerated, jerking motions. It is for this reason NP Elenore Paddy is recommending pt for inpatient treatment. This Clinical research associate will explore bed availability. See collateral.   Collateral contact: Writer spoke with pt's mother [name] [phone]) for updates. Mother reports that the pt called her at around 10:50 PM asking her to pick him up. Mother reported that the pt was shaking/twichy upon her arrival, leading her to suspect that the pt had indulged in some type of substance. Mother explained that the pt mentioned methamphetamine. Mother reports that the pt smokes marijuana occasionally;  However, he is more of a drinker.  Mother explained that the pt has a hx of being rageful and delusional. Mother reported that the pt refuses to go to [facility] and never engages with his peer support rep. Mother explained that the patient's father has mental health issues as well. Mother reports that the pt needs stabilizing and keeps his room filthy; dresses in bizarrely, and is unreceptive to her advice/interventions.   Please be advised: Pt requested that Dr. Toni Amend contact her tomorrow as she would like to speak with him.

## 2020-05-31 NOTE — ED Notes (Signed)
Blanket placed on pt, pt laying in bed with eyes closed and has calmed. PT did allow all things needed to be completed to be done by staff. Began exaggerated and bazaar movements after receiving medications. Light dimmed, to continue to monitor.

## 2020-05-31 NOTE — BH Assessment (Addendum)
Referral information for inpatient placement have been faxed to:    [facility] [phone]) No answer    [facility] ([phone]--[phone])   [facility] [phone] or [phone])    [facility] [phone]),    [address] [phone]),    [facility] [phone]),   [facility] [phone], [phone], [phone] or [phone]),    [facility] [phone]),    [facility] [phone]).

## 2020-05-31 NOTE — ED Notes (Signed)
Attempt to call report X 1, had to leave number and name with staff for return phone call.

## 2020-05-31 NOTE — ED Notes (Signed)
Attempt to call report X 2 by this RN unsuccessful. Spoke with Molly Maduro who states that nurse will call back.

## 2020-05-31 NOTE — ED Triage Notes (Signed)
Pt brought in by mother for bizarre behavior, hx of the same.

## 2020-05-31 NOTE — BH Assessment (Addendum)
Patient has been accepted to [facility].  Patient assigned to [facility] Accepting physician is Dr. Betti Cruz.  Call report to [phone] or [phone].  Representative was [employer].   ER Staff is aware of it:  Bonita Quin, ER Secretary  Dr. Ferd Glassing, ER MD  Thayer Ohm, Patient's Nurse     Please be advised: Pt can be transported anytime after 9am on 05/31/20. Pt should report to the [employer] for covid testing.

## 2020-05-31 NOTE — Consult Note (Signed)
[facility] Face-to-Face Psychiatry Consult   Reason for Consult: Bizarre behavior Referring Physician: Dr. York Cerise Patient Identification: Ronald [name] MRN:  [id number] Principal Diagnosis: <principal problem not specified> Diagnosis:  Active Problems:   MDD (major depressive disorder), severe (HCC)   Adjustment disorder with mixed anxiety and depressed mood   Acute psychosis (HCC)   Alcohol-induced psychosis (HCC)   Total Time spent with patient: 20 minutes  Subjective:   Ronald [name] is a 25 y.o. male patient presented to [facility] ED via POV by his mother for bizarre behaviors, which he has a history of the same. The patient is placed under IVC.  The patient was seen face-to-face by this provider; the chart was reviewed and consulted with Dr. York Cerise on 05/31/2020 due to the patient's care. It was discussed with the EDP that the patient does meet the criteria to be admitted to the psychiatric inpatient unit.  On evaluation, the patient is alert, lethargic due to his pain medication, and mood-congruent with affect. Once he was aroused, the patient was unable to answer questions. The patient's speech is garbled, and he speaks in nonsensical sentences. The patient does appear to be responding to internal and external stimuli. He is presenting with delusional thinking.  Plan: The patient is a safety risk and requires psychiatric inpatient admission for stabilization and treatment.  HPI: Per Dr. York Cerise: Ronald [name] is a 25 y.o. male whose medical history includes episodes of psychosis, major depressive disorder, adjustment disorder, and alcohol abuse.  He has been hospitalized at [facility] and possibly other places in the past for psychiatric concerns.  He presents tonight voluntarily but with substantial encouragement and redirection from his mother for psychiatric evaluation.  History is somewhat vague but his mother reports that he is not doing well.  She states he "does not have the resources"  he needs to be successful.  She believes he has been using drugs and alcohol.  He is not making sense, seems to be responding to things that are not there, and is acting "twitchy".  She believes he needs psychiatric care and said that he does not do what he needs to do as an outpatient because he will not listen to her.  Symptoms have been gradually worsening over a period of time but have become severe tonight.  He has no medical complaints or concerns.  Past Psychiatric History: No pertinent past psychiatric history  Risk to Self:  Yes Risk to Others:  No Prior Inpatient Therapy:  Yes Prior Outpatient Therapy:  Yes  Past Medical History:  Past Medical History:  Diagnosis Date  . Asthma    No past surgical history on file. Family History: No family history on file. Family Psychiatric  History:  Social History:  Social History   Substance and Sexual Activity  Alcohol Use Yes   Comment: occasionally     Social History   Substance and Sexual Activity  Drug Use Not Currently  . Types: Marijuana    Social History   Socioeconomic History  . Marital status: Single    Spouse name: Not on file  . Number of children: Not on file  . Years of education: Not on file  . Highest education level: Not on file  Occupational History  . Not on file  Tobacco Use  . Smoking status: Heavy Tobacco Smoker    Packs/day: 0.50    Types: Cigarettes  . Smokeless tobacco: Never Used  Substance and Sexual Activity  . Alcohol use: Yes  Comment: occasionally  . Drug use: Not Currently    Types: Marijuana  . Sexual activity: Yes    Partners: Female  Other Topics Concern  . Not on file  Social History Narrative  . Not on file   Social Determinants of Health   Financial Resource Strain: Not on file  Food Insecurity: Not on file  Transportation Needs: Not on file  Physical Activity: Not on file  Stress: Not on file  Social Connections: Not on file   Additional Social History:     Allergies:  No Known Allergies  Labs:  Results for orders placed or performed during the hospital encounter of 05/31/20 (from the past 48 hour(s))  Resp Panel by RT-PCR (Flu A&B, Covid) Nasopharyngeal Swab     Status: None   Collection Time: 05/31/20 12:18 AM   Specimen: Nasopharyngeal Swab; Nasopharyngeal(NP) swabs in vial transport medium  Result Value Ref Range   SARS Coronavirus 2 by RT PCR NEGATIVE NEGATIVE    Comment: (NOTE) SARS-CoV-2 target nucleic acids are NOT DETECTED.  The SARS-CoV-2 RNA is generally detectable in upper respiratory specimens during the acute phase of infection. The lowest concentration of SARS-CoV-2 viral copies this assay can detect is 138 copies/mL. A negative result does not preclude SARS-Cov-2 infection and should not be used as the sole basis for treatment or other patient management decisions. A negative result may occur with  improper specimen collection/handling, submission of specimen other than nasopharyngeal swab, presence of viral mutation(s) within the areas targeted by this assay, and inadequate number of viral copies(<138 copies/mL). A negative result must be combined with clinical observations, patient history, and epidemiological information. The expected result is Negative.  Fact Sheet for Patients:  BloggerCourse.com  Fact Sheet for Healthcare Providers:  SeriousBroker.it  This test is no t yet approved or cleared by the [place] FDA and  has been authorized for detection and/or diagnosis of SARS-CoV-2 by FDA under an Emergency Use Authorization (EUA). This EUA will remain  in effect (meaning this test can be used) for the duration of the COVID-19 declaration under Section 564(b)(1) of the Act, 21 U.S.C.section 360bbb-3(b)(1), unless the authorization is terminated  or revoked sooner.       Influenza A by PCR NEGATIVE NEGATIVE   Influenza B by PCR NEGATIVE NEGATIVE     Comment: (NOTE) The Xpert Xpress SARS-CoV-2/FLU/RSV plus assay is intended as an aid in the diagnosis of influenza from Nasopharyngeal swab specimens and should not be used as a sole basis for treatment. Nasal washings and aspirates are unacceptable for Xpert Xpress SARS-CoV-2/FLU/RSV testing.  Fact Sheet for Patients: BloggerCourse.com  Fact Sheet for Healthcare Providers: SeriousBroker.it  This test is not yet approved or cleared by the [place] FDA and has been authorized for detection and/or diagnosis of SARS-CoV-2 by FDA under an Emergency Use Authorization (EUA). This EUA will remain in effect (meaning this test can be used) for the duration of the COVID-19 declaration under Section 564(b)(1) of the Act, 21 U.S.C. section 360bbb-3(b)(1), unless the authorization is terminated or revoked.  Performed at [facility], [address] [place], [place] [address]   Comprehensive metabolic panel     Status: Abnormal   Collection Time: 05/31/20 12:54 AM  Result Value Ref Range   Sodium 140 135 - 145 mmol/L   Potassium 3.4 (L) 3.5 - 5.1 mmol/L   Chloride 102 98 - 111 mmol/L   CO2 24 22 - 32 mmol/L   Glucose, Bld 75 70 -  99 mg/dL    Comment: Glucose reference range applies only to samples taken after fasting for at least 8 hours.   BUN 21 (H) 6 - 20 mg/dL   Creatinine, Ser 2.20 0.61 - 1.24 mg/dL   Calcium 9.6 8.9 - 25.4 mg/dL   Total Protein 8.4 (H) 6.5 - 8.1 g/dL   Albumin 4.6 3.5 - 5.0 g/dL   AST 270 (H) 15 - 41 U/L   ALT 42 0 - 44 U/L   Alkaline Phosphatase 61 38 - 126 U/L   Total Bilirubin 1.8 (H) 0.3 - 1.2 mg/dL   GFR, Estimated >62 >37 mL/min    Comment: (NOTE) Calculated using the CKD-EPI Creatinine Equation (2021)    Anion gap 14 5 - 15    Comment: Performed at [facility], [address] [place], [place] [address]  Ethanol     Status: None   Collection Time: 05/31/20 12:54 AM  Result  Value Ref Range   Alcohol, Ethyl (B) <10 <10 mg/dL    Comment: (NOTE) Lowest detectable limit for serum alcohol is 10 mg/dL.  For medical purposes only. Performed at [facility], [address] [place], [place] [address]   CBC with Diff     Status: Abnormal   Collection Time: 05/31/20 12:54 AM  Result Value Ref Range   WBC 17.5 (H) 4.0 - 10.5 K/uL   RBC 4.86 4.22 - 5.81 MIL/uL   Hemoglobin 15.4 13.0 - 17.0 g/dL   HCT 60.7 37.1 - 06.2 %   MCV 95.7 80.0 - 100.0 fL   MCH 31.7 26.0 - 34.0 pg   MCHC 33.1 30.0 - 36.0 g/dL   RDW 69.4 85.4 - 62.7 %   Platelets 279 150 - 400 K/uL   nRBC 0.0 0.0 - 0.2 %   Neutrophils Relative % 87 %   Neutro Abs 15.1 (H) 1.7 - 7.7 K/uL   Lymphocytes Relative 6 %   Lymphs Abs 1.1 0.7 - 4.0 K/uL   Monocytes Relative 7 %   Monocytes Absolute 1.2 (H) 0.1 - 1.0 K/uL   Eosinophils Relative 0 %   Eosinophils Absolute 0.0 0.0 - 0.5 K/uL   Basophils Relative 0 %   Basophils Absolute 0.0 0.0 - 0.1 K/uL   Immature Granulocytes 0 %   Abs Immature Granulocytes 0.07 0.00 - 0.07 K/uL    Comment: Performed at [facility], [address] [place], [place] [address]  Acetaminophen level     Status: Abnormal   Collection Time: 05/31/20 12:54 AM  Result Value Ref Range   Acetaminophen (Tylenol), Serum <10 (L) 10 - 30 ug/mL    Comment: (NOTE) Therapeutic concentrations vary significantly. A range of 10-30 ug/mL  may be an effective concentration for many patients. However, some  are best treated at concentrations outside of this range. Acetaminophen concentrations >150 ug/mL at 4 hours after ingestion  and >50 ug/mL at 12 hours after ingestion are often associated with  toxic reactions.  Performed at [facility], [address] [place], [place] [address]   Salicylate level     Status: Abnormal   Collection Time: 05/31/20 12:54 AM  Result Value Ref Range   Salicylate Lvl <7.0 (L) 7.0 - 30.0 mg/dL    Comment: Performed at  [facility], [address] [place], [place] [address]    Current Facility-Administered Medications  Medication Dose Route Frequency Provider Last Rate Last Admin  . LORazepam (ATIVAN) injection 0-4 mg  0-4 mg Intravenous  Q6H Hinda Kehr, MD       Or  . LORazepam (ATIVAN) tablet 0-4 mg  0-4 mg Oral Q6H Hinda Kehr, MD      . Derrill Memo ON 06/02/2020] LORazepam (ATIVAN) injection 0-4 mg  0-4 mg Intravenous Q12H Hinda Kehr, MD       Or  . Derrill Memo ON 06/02/2020] LORazepam (ATIVAN) tablet 0-4 mg  0-4 mg Oral Q12H Hinda Kehr, MD      . thiamine tablet 100 mg  100 mg Oral Daily Hinda Kehr, MD       Or  . thiamine (B-1) injection 100 mg  100 mg Intravenous Daily Hinda Kehr, MD       Current Outpatient Medications  Medication Sig Dispense Refill  . benztropine (COGENTIN) 0.5 MG tablet Take 1 tablet (0.5 mg total) by mouth at bedtime. (Patient not taking: Reported on 01/29/2020) 30 tablet 0  . risperiDONE (RISPERDAL) 2 MG tablet Take 1 tablet (2 mg total) by mouth at bedtime. (Patient not taking: Reported on 01/29/2020) 30 tablet 0    Musculoskeletal: Strength & Muscle Tone: within normal limits Gait & Station: normal Patient leans: N/A  Psychiatric Specialty Exam: Physical Exam Nursing note reviewed.  HENT:     Nose: Nose normal.     Mouth/Throat:     Mouth: Mucous membranes are moist.  Cardiovascular:     Rate and Rhythm: Tachycardia present.  Pulmonary:     Effort: Pulmonary effort is normal.  Musculoskeletal:        General: Normal range of motion.     Cervical back: Normal range of motion and neck supple.  Neurological:     Mental Status: He is alert. He is disoriented.  Psychiatric:        Attention and Perception: He perceives auditory and visual hallucinations.        Speech: Speech is rapid and pressured.        Thought Content: Thought content is delusional.        Cognition and Memory: Cognition is impaired. Memory is impaired. He exhibits impaired  recent memory and impaired remote memory.        Judgment: Judgment is inappropriate.     Review of Systems  Psychiatric/Behavioral: Positive for confusion and hallucinations.  All other systems reviewed and are negative.   Blood pressure 135/89, pulse (!) 135, resp. rate 20, height 5\' 8"  (1.727 m), weight 65.8 kg, SpO2 100 %.Body mass index is 22.05 kg/m.  General Appearance: Bizarre  Eye Contact:  Poor  Speech:  Garbled  Volume:  Normal  Mood:  Euphoric  Affect:  Congruent and Inappropriate  Thought Process:  Disorganized  Orientation:  Negative  Thought Content:  Illogical, Delusions and Hallucinations: Auditory Visual  Suicidal Thoughts:  No  Homicidal Thoughts:  No  Memory:  Immediate;   Poor  Judgement:  Impaired  Insight:  Lacking  Psychomotor Activity:  Normal  Concentration:  Concentration: Poor  Recall:  Poor  Fund of Knowledge:  Poor  Language:  Poor  Akathisia:  Negative  Handed:  Right  AIMS (if indicated):     Assets:  Communication Skills Desire for Improvement Resilience Social Support  ADL's:  Intact  Cognition:  Impaired,  Moderate  Sleep:        Treatment Plan Summary: Medication management and Plan The patient is a safety risk to himself and requires psychiatric inpatient admission for stabilization and treatment.  Disposition: Recommend psychiatric Inpatient admission when medically cleared. Supportive therapy provided about ongoing  stressors.  Gillermo Murdoch, NP 05/31/2020 2:27 AM

## 2020-05-31 NOTE — ED Notes (Signed)
Left with [facility] and 4 bags of labeled belongings.

## 2020-05-31 NOTE — ED Notes (Signed)
Genevieve Norlander, RN called back and received report for patient.

## 2020-05-31 NOTE — ED Notes (Signed)
Pt takes IM injections willingly at this time, mother is in room and is able to help keep pt calm. Pt left tan jacket in triage, at desk and to be placed in belonging bag to go home with mother. Pt not allowing staff to collect other belongings at this time. Will continue to monitor.

## 2020-05-31 NOTE — ED Provider Notes (Signed)
[facility] Emergency Department Provider Note  ____________________________________________   Event Date/Time   First MD Initiated Contact with Patient 05/31/20 0005     (approximate)  I have reviewed the triage vital signs and the nursing notes.   HISTORY  Chief Complaint Medical Clearance and Psychiatric Evaluation  Level 5 caveat:  history/ROS limited by active psychosis / mental illness / altered mental status   HPI Ronald [name] is a 25 y.o. male whose medical history includes episodes of psychosis, major depressive disorder, adjustment disorder, and alcohol abuse.  He has been hospitalized at [facility] and possibly other places in the past for psychiatric concerns.  He presents tonight voluntarily but with substantial encouragement and redirection from his mother for psychiatric evaluation.  History is somewhat vague but his mother reports that he is not doing well.  She states he "does not have the resources" he needs to be successful.  She believes he has been using drugs and alcohol.  He is not making sense, seems to be responding to things that are not there, and is acting "twitchy".  She believes he needs psychiatric care and said that he does not do what he needs to do as an outpatient because he will not listen to her.  Symptoms have been gradually worsening over a period of time but have become severe tonight.  He has no medical complaints or concerns.         Past Medical History:  Diagnosis Date  . Asthma     Patient Active Problem List   Diagnosis Date Noted  . 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 Acute psychosis (HCC)   . MDD (major depressive disorder), severe (HCC) 08/12/2018  . Adjustment disorder with mixed anxiety and depressed mood 08/12/2018  . Cannabis abuse 08/12/2018  . Alcohol abuse 08/12/2018    No past surgical history on file.  Prior to Admission medications   Medication Sig Start Date End Date Taking?  Authorizing Provider  benztropine (COGENTIN) 0.5 MG tablet Take 1 tablet (0.5 mg total) by mouth at bedtime. Patient not taking: Reported on 01/29/2020 05/08/19   Clapacs, Jackquline Denmark, MD  risperiDONE (RISPERDAL) 2 MG tablet Take 1 tablet (2 mg total) by mouth at bedtime. Patient not taking: Reported on 01/29/2020 05/08/19   Clapacs, Jackquline Denmark, MD    Allergies Patient has no known allergies.  No family history on file.  Social History Social History   Tobacco Use  . Smoking status: Heavy Tobacco Smoker    Packs/day: 0.50    Types: Cigarettes  . Smokeless tobacco: Never Used  Substance Use Topics  . Alcohol use: Yes    Comment: occasionally  . Drug use: Not Currently    Types: Marijuana    Review of Systems Level 5 caveat:  history/ROS limited by active psychosis / mental illness / altered mental status  ____________________________________________   PHYSICAL EXAM:  VITAL SIGNS: ED Triage Vitals  Enc Vitals Group     BP 05/31/20 0012 135/89     Pulse Rate 05/31/20 0012 (!) 135     Resp 05/31/20 0012 20     Temp --      Temp src --      SpO2 05/31/20 0012 100 %     Weight 05/31/20 0006 65.8 kg (145 lb)     Height 05/31/20 0006 1.727 m (5\' 8" )     Head Circumference --      Peak Flow --  Pain Score 05/31/20 0006 0     Pain Loc --      Pain Edu? --      Excl. in GC? --     Constitutional: Awake and alert, agitated, difficult to redirect. Eyes: Conjunctivae are normal.  Head: Atraumatic. Nose: No congestion/rhinnorhea. Neck: No stridor.  No meningeal signs.   Cardiovascular: Tachycardia, regular rhythm. Good peripheral circulation. Respiratory: Normal respiratory effort.  No retractions. Gastrointestinal: Soft and nondistended. Musculoskeletal: No lower extremity tenderness nor edema. No gross deformities of extremities. Neurologic:  No gross focal neurologic deficits are appreciated but patient is not willing or able to participate and a thorough neurological exam.   No tremor is obvious at this time. Skin:  Skin is warm, dry and intact. Psychiatric: Mood and affect are bizarre, agitated, pressured and nonsensical speech, appears to be responding to internal stimuli.  ____________________________________________   LABS (all labs ordered are listed, but only abnormal results are displayed)  Labs Reviewed  COMPREHENSIVE METABOLIC PANEL - Abnormal; Notable for the following components:      Result Value   Potassium 3.4 (*)    BUN 21 (*)    Total Protein 8.4 (*)    AST 184 (*)    Total Bilirubin 1.8 (*)    All other components within normal limits  CBC WITH DIFFERENTIAL/PLATELET - Abnormal; Notable for the following components:   WBC 17.5 (*)    Neutro Abs 15.1 (*)    Monocytes Absolute 1.2 (*)    All other components within normal limits  ACETAMINOPHEN LEVEL - Abnormal; Notable for the following components:   Acetaminophen (Tylenol), Serum <10 (*)    All other components within normal limits  SALICYLATE LEVEL - Abnormal; Notable for the following components:   Salicylate Lvl <7.0 (*)    All other components within normal limits  RESP PANEL BY RT-PCR (FLU A&B, COVID) ARPGX2  ETHANOL  URINE DRUG SCREEN, QUALITATIVE ([facility] ONLY)   ____________________________________________  EKG  ED ECG REPORT I, Loleta Rose, the attending physician, personally viewed and interpreted this ECG.  Date: 05/31/2020 EKG Time: 00: 42 Rate: 88 Rhythm: normal sinus rhythm with sinus arrhythmia QRS Axis: normal Intervals: normal, QTC 418 ms ST/T Wave abnormalities: No significant T wave or ST segment changes. Narrative Interpretation: no definitive evidence of acute ischemia; does not meet STEMI criteria.   ____________________________________________  RADIOLOGY I, Loleta Rose, personally viewed and evaluated these images (plain radiographs) as part of my medical decision making, as well as reviewing the written report by the radiologist.  ED MD  interpretation: No indication for emergent imaging  Official radiology report(s): No results found.  ____________________________________________   PROCEDURES   Procedure(s) performed (including Critical Care):  Procedures   ____________________________________________   INITIAL IMPRESSION / MDM / ASSESSMENT AND PLAN / ED COURSE  As part of my medical decision making, I reviewed the following data within the electronic MEDICAL RECORD NUMBER History obtained from family, Nursing notes reviewed and incorporated, Labs reviewed , EKG interpreted , Old EKG reviewed, A consult was requested and obtained from this/these consultant(s) Psychiatry and Notes from prior ED visits   Differential diagnosis includes, but is not limited to, adjustment disorder, substance-induced mood disorder, intoxication, schizophrenia or schizoaffective disorder, metabolic or electrolyte abnormality, acute infection.  Patient is agitated and appears manic, possibly psychotic based on his bizarre behavior and response to what appears to be internal stimuli.  He does not have the capacity make his own decisions.  I placed him under  involuntary commitment.  I ordered Versed 2 mg intramuscular, Benadryl 25 mg intramuscular, and haloperidol 5 mg intramuscular after verifying that he has no history of QTC prolongation in the past.  He excepted the medications voluntarily and has started to calm down.  His mother agrees with the plan for psychiatry evaluation and disposition.  Labs are generally unremarkable.  He has a slight elevation of his AST likely due to his ongoing alcohol abuse but his alcohol level is negative at this time.  I will put him on CIWA protocol.  Anion gap is normal.  Leukocytosis of 17.5 which is nonspecific particularly in a young male and is likely a stress reaction.  Hemoglobin is normal.  Acetaminophen and salicylate levels are pending.  COVID-19 test is negative.  The patient has been placed in  psychiatric observation due to the need to provide a safe environment for the patient while obtaining psychiatric consultation and evaluation, as well as ongoing medical and medication management to treat the patient's condition.  The patient has been placed under full IVC at this time.          ____________________________________________  FINAL CLINICAL IMPRESSION(S) / ED DIAGNOSES  Final diagnoses:  Psychosis, unspecified psychosis type ([place])     MEDICATIONS GIVEN DURING THIS VISIT:  Medications  LORazepam (ATIVAN) injection 0-4 mg ( Intravenous See Alternative 05/31/20 0201)    Or  LORazepam (ATIVAN) tablet 0-4 mg (0 mg Oral Not Given 05/31/20 0201)  LORazepam (ATIVAN) injection 0-4 mg (has no administration in time range)    Or  LORazepam (ATIVAN) tablet 0-4 mg (has no administration in time range)  thiamine tablet 100 mg (has no administration in time range)    Or  thiamine (B-1) injection 100 mg (has no administration in time range)  midazolam (VERSED) injection 2 mg (2 mg Intramuscular Given 05/31/20 0022)  haloperidol lactate (HALDOL) injection 5 mg (5 mg Intramuscular Given 05/31/20 0015)  diphenhydrAMINE (BENADRYL) injection 25 mg (25 mg Intramuscular Given 05/31/20 0015)     ED Discharge Orders    None      *Please note:  Stein Klosinski was evaluated in Emergency Department on 05/31/2020 for the symptoms described in the history of present illness. He was evaluated in the context of the global COVID-19 pandemic, which necessitated consideration that the patient might be at risk for infection with the SARS-CoV-2 virus that causes COVID-19. Institutional protocols and algorithms that pertain to the evaluation of patients at risk for COVID-19 are in a state of rapid change based on information released by regulatory bodies including the CDC and federal and state organizations. These policies and algorithms were followed during the patient's care in the ED.  Some ED evaluations and  interventions may be delayed as a result of limited staffing during and after the pandemic.*  Note:  This document was prepared using Dragon voice recognition software and may include unintentional dictation errors.   Hinda Kehr, MD 05/31/20 [phone]

## 2020-05-31 NOTE — ED Notes (Signed)
Pt dressed out into hospital provided wine color scrubs. Pt belongings collected and secured, labeled in 3 bags. Pt belongings include:  Black shoes White socks Black/red pants Gray shorts [employer]  assisted by [employer]

## 2020-05-31 NOTE — ED Notes (Signed)
Pt given breakfast tray

## 2020-05-31 NOTE — ED Notes (Signed)
Pt does ask about COVID and then allows this nurse to collect covid test. Unsafe at this time to attempt to collect blood work. Will continue to monitor.

## 2020-05-31 NOTE — ED Notes (Signed)
Pt is asleep still from sedation, no signs of withdrawal at this time. Will continue to monitor.

## 2020-05-31 NOTE — ED Notes (Signed)
[place]  [employer]  DEPT  CALLED  FOR TRANSPORT TO  [facility] 

## 2020-10-22 ENCOUNTER — Other Ambulatory Visit: Payer: Self-pay

## 2020-10-22 ENCOUNTER — Emergency Department
Admission: EM | Admit: 2020-10-22 | Discharge: 2020-10-25 | Disposition: A | Discharge status: Home / Self Care | Payer: No Typology Code available for payment source | Attending: Emergency Medicine | Admitting: Emergency Medicine

## 2020-10-22 DIAGNOSIS — F17210 Nicotine dependence, cigarettes, uncomplicated: Secondary | ICD-10-CM | POA: Insufficient documentation

## 2020-10-22 DIAGNOSIS — J45909 Unspecified asthma, uncomplicated: Secondary | ICD-10-CM | POA: Diagnosis not present

## 2020-10-22 DIAGNOSIS — Z20822 Contact with and (suspected) exposure to covid-19: Secondary | ICD-10-CM | POA: Diagnosis not present

## 2020-10-22 DIAGNOSIS — R000 Tachycardia, unspecified: Secondary | ICD-10-CM | POA: Diagnosis not present

## 2020-10-22 DIAGNOSIS — F15959 Other stimulant use, unspecified with stimulant-induced psychotic disorder, unspecified: Secondary | ICD-10-CM | POA: Diagnosis not present

## 2020-10-22 DIAGNOSIS — Z046 Encounter for general psychiatric examination, requested by authority: Secondary | ICD-10-CM | POA: Diagnosis present

## 2020-10-22 DIAGNOSIS — N179 Acute kidney failure, unspecified: Secondary | ICD-10-CM | POA: Insufficient documentation

## 2020-10-22 DIAGNOSIS — F29 Unspecified psychosis not due to a substance or known physiological condition: Secondary | ICD-10-CM

## 2020-10-22 LAB — COMPREHENSIVE METABOLIC PANEL
ALT: 39 U/L (ref 0–44)
AST: 80 U/L — ABNORMAL HIGH (ref 15–41)
Albumin: 4.9 g/dL (ref 3.5–5.0)
Alkaline Phosphatase: 58 U/L (ref 38–126)
Anion gap: 16 — ABNORMAL HIGH (ref 5–15)
BUN: 24 mg/dL — ABNORMAL HIGH (ref 6–20)
CO2: 22 mmol/L (ref 22–32)
Calcium: 9.7 mg/dL (ref 8.9–10.3)
Chloride: 98 mmol/L (ref 98–111)
Creatinine, Ser: 1.65 mg/dL — ABNORMAL HIGH (ref 0.61–1.24)
GFR, Estimated: 60 mL/min — ABNORMAL LOW (ref >60)
Glucose, Bld: 82 mg/dL (ref 70–99)
Potassium: 3.5 mmol/L (ref 3.5–5.1)
Sodium: 136 mmol/L (ref 135–145)
Total Bilirubin: 1.8 mg/dL — ABNORMAL HIGH (ref 0.3–1.2)
Total Protein: 8.7 g/dL — ABNORMAL HIGH (ref 6.5–8.1)

## 2020-10-22 LAB — CBC WITH DIFFERENTIAL/PLATELET
Abs Immature Granulocytes: 0.06 10*3/uL (ref 0.00–0.07)
Basophils Absolute: 0.1 10*3/uL (ref 0.0–0.1)
Basophils Relative: 0 %
Eosinophils Absolute: 0 10*3/uL (ref 0.0–0.5)
Eosinophils Relative: 0 %
HCT: 46.4 % (ref 39.0–52.0)
Hemoglobin: 16.2 g/dL (ref 13.0–17.0)
Immature Granulocytes: 0 %
Lymphocytes Relative: 8 %
Lymphs Abs: 1.2 10*3/uL (ref 0.7–4.0)
MCH: 31.6 pg (ref 26.0–34.0)
MCHC: 34.9 g/dL (ref 30.0–36.0)
MCV: 90.4 fL (ref 80.0–100.0)
Monocytes Absolute: 1.1 10*3/uL — ABNORMAL HIGH (ref 0.1–1.0)
Monocytes Relative: 7 %
Neutro Abs: 12.4 10*3/uL — ABNORMAL HIGH (ref 1.7–7.7)
Neutrophils Relative %: 85 %
Platelets: 302 10*3/uL (ref 150–400)
RBC: 5.13 MIL/uL (ref 4.22–5.81)
RDW: 12.6 % (ref 11.5–15.5)
WBC: 14.8 10*3/uL — ABNORMAL HIGH (ref 4.0–10.5)
nRBC: 0 % (ref 0.0–0.2)

## 2020-10-22 LAB — ETHANOL: Alcohol, Ethyl (B): <10 mg/dL (ref <10)

## 2020-10-22 LAB — RESP PANEL BY RT-PCR (FLU A&B, COVID) ARPGX2
Influenza A by PCR: NEGATIVE
Influenza B by PCR: NEGATIVE
SARS Coronavirus 2 by RT PCR: NEGATIVE

## 2020-10-22 LAB — ACETAMINOPHEN LEVEL: Acetaminophen (Tylenol), Serum: <10 ug/mL — ABNORMAL LOW (ref 10–30)

## 2020-10-22 LAB — SALICYLATE LEVEL: Salicylate Lvl: <7 mg/dL — ABNORMAL LOW (ref 7.0–30.0)

## 2020-10-22 MED ORDER — DIPHENHYDRAMINE HCL 50 MG/ML IJ SOLN: 50.0000 mg | Freq: Once | INTRAMUSCULAR | Status: AC

## 2020-10-22 MED ORDER — HALOPERIDOL LACTATE 5 MG/ML IJ SOLN: 5.0000 mg | Freq: Once | INTRAMUSCULAR | Status: AC

## 2020-10-22 MED ORDER — DIPHENHYDRAMINE HCL 50 MG/ML IJ SOLN
INTRAMUSCULAR | Status: AC
  Administered 2020-10-22: 50 mg via INTRAMUSCULAR
  Filled 2020-10-22: qty 1

## 2020-10-22 MED ORDER — HALOPERIDOL LACTATE 5 MG/ML IJ SOLN
INTRAMUSCULAR | Status: AC
  Administered 2020-10-22: 5 mg via INTRAMUSCULAR
  Filled 2020-10-22: qty 1

## 2020-10-22 MED ORDER — LACTATED RINGERS IV BOLUS
1000.0000 mL | Freq: Once | INTRAVENOUS | Status: AC
  Administered 2020-10-22: 1000 mL via INTRAVENOUS

## 2020-10-22 MED ORDER — LORAZEPAM 2 MG/ML IJ SOLN: 2.0000 mg | Freq: Once | INTRAMUSCULAR | Status: AC

## 2020-10-22 MED ORDER — LORAZEPAM 2 MG/ML IJ SOLN
INTRAMUSCULAR | Status: AC
  Administered 2020-10-22: 2 mg via INTRAMUSCULAR
  Filled 2020-10-22: qty 1

## 2020-10-22 NOTE — ED Notes (Signed)
Handcuffs removed. Law enforcement left department at this time.

## 2020-10-22 NOTE — ED Notes (Signed)
Patient clothing noted to be sweaty/moist. Clothing removed by this RN and Selena Batten, ED NT. Clothing bagged by Selena Batten, NT.   Shirt, pants, and tennis shoes removed and bagged.  Patient also arrived to ED with two backpacks. Both backpacks (black and red) labelled by Selena Batten, NT and placed at nurse's station. Patient wallet noted to be among belongings in black backpack. Patient phone bagged in belongings bag with clothing.

## 2020-10-22 NOTE — ED Notes (Signed)
On arrival to ED, patient noted to have exaggerated movement to BUE/BLE, and tongue. Patient fidgeting, swinging his arms, and talking to himself. Patient seems to be responding to unseen or internal stimuli. Personal belongings (backpack) provided to this RN by police. Patient clothing remains on until patient cooperative.

## 2020-10-22 NOTE — ED Notes (Signed)
Patient is resting comfortably.  Respirations even and unlabored

## 2020-10-22 NOTE — ED Notes (Signed)
Patient medicated per [facility] on arrival to [facility] for his safety and staff safety. Patient uncooperative and combative with law enforcement and security. IVC papers provided to [employer] operational officer.

## 2020-10-22 NOTE — ED Notes (Signed)
TTS unable to complete assessment at this time. Patient unable to engage with assessor.

## 2020-10-22 NOTE — ED Notes (Signed)
Pt was dress out by this tech and [employer]. Pt's belongings are as follows  Shoes Chief Technology Officer Back Pack that has books, charger cord, wallet (no cash) toothbrush, underwear.

## 2020-10-22 NOTE — ED Triage Notes (Addendum)
Patient arrives via police Counsellor) for psychiatric evaluation.  [employer] report the patient was found barricading himself in a motel room claiming people were coming to kill him. Police report patient was combative, having visual and auditory hallucinations, and was uncooperative on scene.   Dr. Katrinka Blazing to bedside on patient arrival. Security assisting with patient at this time. Patient arrived to ED in handcuffs.

## 2020-10-22 NOTE — ED Notes (Signed)
Patient uncooperative on arrival to ED. Patient stating "that's not my name, that's not my name." Patient denies SI/HI, but reports "they're after me, I gotta get away." Patient denies physical needs, but is noted to have a flight of ideas and difficulty focusing on this RN's questions. Security remains at bedside.

## 2020-10-22 NOTE — ED Provider Notes (Signed)
[facility] Emergency Department Provider Note  ____________________________________________   Event Date/Time   First MD Initiated Contact with Patient 10/22/20 2122     (approximate)  I have reviewed the triage vital signs and the nursing notes.   HISTORY  Chief Complaint Psychiatric Evaluation   HPI Ronald [name] is a 25 y.o. male with past medical history of asthma, alcohol induced psychosis, adjustment disorder and cannabis use who presents in police custody for assessment of some erratic behavior and concern for possible psychosis.  Police were reportedly called out to a motel after patient was reportedly acting erratically and barricaded himself in the room and was yelling at hotel staff nonsensically.  Patient is unable to find any history as he is altered on arrival.  No other history is immediately available on patient arrival.  Patient denies any SI or HI although is unable provide any additional history as he is extremely tangential and somewhat nonsensical.  When asked the date he states "it is 2022 and this is not the future and he cannot get me but there is nothing there if I do not want to be".  I am unable to ascertain if he is taking any illegal drugs.  He denies any recent injuries.  He is unable to specify if he has had any other sick symptoms.          Past Medical History:  Diagnosis Date  . Asthma     Patient Active Problem List   Diagnosis Date Noted  . 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 Acute psychosis (HCC)   . MDD (major depressive disorder), severe (HCC) 08/12/2018  . Adjustment disorder with mixed anxiety and depressed mood 08/12/2018  . Cannabis abuse 08/12/2018  . Alcohol abuse 08/12/2018    History reviewed. No pertinent surgical history.  Prior to Admission medications   Medication Sig Start Date End Date Taking? Authorizing Provider  benztropine (COGENTIN) 0.5 MG tablet Take 1 tablet (0.5 mg total) by  mouth at bedtime. Patient not taking: Reported on 01/29/2020 05/08/19   Clapacs, Jackquline Denmark, MD  risperiDONE (RISPERDAL) 2 MG tablet Take 1 tablet (2 mg total) by mouth at bedtime. Patient not taking: Reported on 01/29/2020 05/08/19   Clapacs, Jackquline Denmark, MD    Allergies Patient has no known allergies.  History reviewed. No pertinent family history.  Social History Social History   Tobacco Use  . Smoking status: Heavy Tobacco Smoker    Packs/day: 0.50    Types: Cigarettes  . Smokeless tobacco: Never Used  Substance Use Topics  . Alcohol use: Yes    Comment: occasionally  . Drug use: Not Currently    Types: Marijuana    Review of Systems  Review of Systems  Unable to perform ROS: Psychiatric disorder      ____________________________________________   PHYSICAL EXAM:  VITAL SIGNS: ED Triage Vitals  Enc Vitals Group     BP      Pulse      Resp      Temp      Temp src      SpO2      Weight      Height      Head Circumference      Peak Flow      Pain Score      Pain Loc      Pain Edu?      Excl. in GC?    Vitals:   10/22/20 2204  BP: 121/72  Pulse: (!) 115  Resp: 16  Temp: 98 F (36.7 C)  SpO2: 96%   Physical Exam Vitals and nursing note reviewed.  Constitutional:      Appearance: He is well-developed.  HENT:     Head: Normocephalic and atraumatic.     Right Ear: External ear normal.     Left Ear: External ear normal.     Nose: Nose normal.     Mouth/Throat:     Mouth: Mucous membranes are dry.  Eyes:     Conjunctiva/sclera: Conjunctivae normal.  Cardiovascular:     Rate and Rhythm: Regular rhythm. Tachycardia present.     Heart sounds: No murmur heard.   Pulmonary:     Effort: Pulmonary effort is normal. No respiratory distress.     Breath sounds: Normal breath sounds.  Abdominal:     Palpations: Abdomen is soft.     Tenderness: There is no abdominal tenderness.  Musculoskeletal:     Cervical back: Neck supple.  Skin:    General: Skin is  warm and dry.     Capillary Refill: Capillary refill takes less than 2 seconds.  Neurological:     Mental Status: He is alert. He is disoriented.  Psychiatric:        Mood and Affect: Affect is inappropriate.        Speech: Speech is rapid and pressured and tangential.        Behavior: Behavior is hyperactive.        Thought Content: Thought content does not include homicidal or suicidal ideation.        Cognition and Memory: Cognition is impaired.     Is able to state the year but not the month or the date.  When asked some basic questions he states "this is in the future". ____________________________________________   LABS (all labs ordered are listed, but only abnormal results are displayed)  Labs Reviewed  COMPREHENSIVE METABOLIC PANEL - Abnormal; Notable for the following components:      Result Value   BUN 24 (*)    Creatinine, Ser 1.65 (*)    Total Protein 8.7 (*)    AST 80 (*)    Total Bilirubin 1.8 (*)    GFR, Estimated 60 (*)    Anion gap 16 (*)    All other components within normal limits  CBC WITH DIFFERENTIAL/PLATELET - Abnormal; Notable for the following components:   WBC 14.8 (*)    Neutro Abs 12.4 (*)    Monocytes Absolute 1.1 (*)    All other components within normal limits  ACETAMINOPHEN LEVEL - Abnormal; Notable for the following components:   Acetaminophen (Tylenol), Serum <10 (*)    All other components within normal limits  SALICYLATE LEVEL - Abnormal; Notable for the following components:   Salicylate Lvl <7.0 (*)    All other components within normal limits  RESP PANEL BY RT-PCR (FLU A&B, COVID) ARPGX2  ETHANOL  URINE DRUG SCREEN, QUALITATIVE ([facility] ONLY)  PROCALCITONIN   ____________________________________________  EKG  Sinus rhythm with a ventricular rate of 100, normal axis, unremarkable intervals with some benign J-point elevation in V1 and V2 without recurrence of acute ischemia or significant underlying  arrhythmia. ____________________________________________  RADIOLOGY  ED MD interpretation:    Official radiology report(s): No results found.  ____________________________________________   PROCEDURES  Procedure(s) performed (including Critical Care):  Procedures   ____________________________________________   INITIAL IMPRESSION / ASSESSMENT AND PLAN / ED COURSE      Patient presents with above-stated  history and exam for assessment of some erratic behavior after barricading self in her room at a motel.  On arrival he is quite hyperactive tangential and able to deny SI HI or injuries but unable provide any additional history.  Patient immediately IVC due to concerns for imminent patient safety.  He is afebrile and hemodynamically stable on arrival.  No obvious findings of trauma on exam or clear focal deficits.  We will send routine psych screening labs to assess for other possible GERD etiologies contributing presentation I am concerned for possible alcohol versus other drug-induced psychosis versus underlying psychotic disorder.  TTS and psychiatry consulted.  Patient given sedation medications on arrival to maintain patient's safety is unable to follow directions or engage in any conversation.  The patient has been placed in psychiatric observation due to the need to provide a safe environment for the patient while obtaining psychiatric consultation and evaluation, as well as ongoing medical and medication management to treat the patient's condition.  The patient has been placed under full IVC at this time.  CMP remarkable for evidence of an AKI with a creatinine of 1.65 and AST of 80 without any other significant electrolyte or metabolic derangements.  Bicarb is 22.  CBC shows leukocytosis with WBC count of 14.8 with no acute anemia.  Acetaminophen and salicylate and ethanol level undetectable.  Given evidence of AKI and leukocytosis we will give IV fluids and obtain a  procalcitonin as well.  Care patient signed over to oncoming provider at approximately 2300.  Plan is to follow-up procalcitonin and if this is negative suspect leukocytosis and evidence of dehydration likely secondary to acute psychotic episode.  If procalcitonin is elevated plan is to obtain lactic UA chest x-ray and infectious work-up. ____________________________________________   FINAL CLINICAL IMPRESSION(S) / ED DIAGNOSES  Final diagnoses:  Psychosis, unspecified psychosis type (HCC)  AKI (acute kidney injury) (HCC)    Medications  lactated ringers bolus 1,000 mL (has no administration in time range)  LORazepam (ATIVAN) injection 2 mg (2 mg Intramuscular Given 10/22/20 2129)  haloperidol lactate (HALDOL) injection 5 mg (5 mg Intramuscular Given 10/22/20 2130)  diphenhydrAMINE (BENADRYL) injection 50 mg (50 mg Intramuscular Given 10/22/20 2130)     ED Discharge Orders    None       Note:  This document was prepared using Dragon voice recognition software and may include unintentional dictation errors.   Gilles Chiquito, MD 10/22/20 [phone]

## 2020-10-23 DIAGNOSIS — F15959 Other stimulant use, unspecified with stimulant-induced psychotic disorder, unspecified: Secondary | ICD-10-CM | POA: Diagnosis not present

## 2020-10-23 LAB — PROCALCITONIN: Procalcitonin: 0.16 ng/mL

## 2020-10-23 MED ORDER — GABAPENTIN 300 MG PO CAPS
300.0000 mg | ORAL_CAPSULE | Freq: Three times a day (TID) | ORAL | Status: DC
  Administered 2020-10-23 – 2020-10-24 (×2): 300 mg via ORAL
  Filled 2020-10-23 (×2): qty 1

## 2020-10-23 NOTE — ED Notes (Signed)
Report to include situation, background, assessment and recommendations from Amy RN. Patient sleeping, respirations regular and unlabored. Q15 minute rounds and security camera observation to continue.    

## 2020-10-23 NOTE — ED Notes (Signed)
Hourly rounding reveals patient in room. No complaints, stable, in no acute distress. Q15 minute rounds and monitoring via Security Cameras to continue. 

## 2020-10-23 NOTE — ED Notes (Signed)
Pt sleeping soundly.  VS will be taken once awake.

## 2020-10-23 NOTE — ED Notes (Signed)
Pt agitated upon arrival on the unit. Pt wanting to rest and be left alone.

## 2020-10-23 NOTE — ED Notes (Signed)
Pt given lunch tray at this time

## 2020-10-23 NOTE — ED Notes (Signed)
Pt given dinner tray.

## 2020-10-23 NOTE — ED Notes (Signed)
Breakfast was given at this time.  Pt is in bed resting

## 2020-10-23 NOTE — ED Notes (Signed)
Pt refused VS  

## 2020-10-23 NOTE — ED Notes (Signed)
IVC, pending consult, pt unable to be assessed due medications

## 2020-10-23 NOTE — BH Assessment (Signed)
This provider and TTS is  unable to complete assessment at this time. Patient unable to engage with assessor due to being medicated for aggressive and psychotic behavior.

## 2020-10-23 NOTE — Consult Note (Signed)
[facility] Face-to-Face Psychiatry Consult   Reason for Consult:  Stimulant abuse with psychosis Referring Physician:  EDP Patient Identification: Ronald [name] MRN:  [id number] Principal Diagnosis: Stimulant-induced psychotic disorder (HCC) Diagnosis:  Principal Problem:   Stimulant-induced psychotic disorder (HCC)   Total Time spent with patient: 45 minutes  Subjective:   Ronald [name] is a 25 y.o. male patient stated, "I need to get my money back from the hotel".  HPI:  25 yo male presented with psychosis after using stimulants at his hotel.  According to the notes:  "[employer] Department report the patient was found barricading himself in a motel room claiming people were coming to kill him. Police report patient was combative, having visual and auditory hallucinations, and was uncooperative on scene."  He was given agitation medications and slept last night.  This morning he was too somnolent and returned to assess this afternoon.  He states he was using drugs last night and the police came.  He needs to return to get his money back, too sleepy at this time with jerky movements which appear to be stimulant withdrawal symptoms. Sleep is reducing his withdrawal symptoms significantly.  No hallucinations on assessment or paranoia.  Denies suicidal/homicidal ideations.  Gabapentin started to assist his withdrawal and will reassess for discharge in the am as he will most likely be back to his baseline.  Declined rehab for substance abuse on assessment.  Past Psychiatric History: polysubstance use d/o  Risk to Self:  none Risk to Others:  none Prior Inpatient Therapy:  none Prior Outpatient Therapy:  none  Past Medical History:  Past Medical History:  Diagnosis Date  . Asthma    History reviewed. No pertinent surgical history. Family History: History reviewed. No pertinent family history. Family Psychiatric  History: none Social History:  Social History   Substance and Sexual  Activity  Alcohol Use Yes   Comment: occasionally     Social History   Substance and Sexual Activity  Drug Use Not Currently  . Types: Marijuana    Social History   Socioeconomic History  . Marital status: Single    Spouse name: Not on file  . Number of children: Not on file  . Years of education: Not on file  . Highest education level: Not on file  Occupational History  . Not on file  Tobacco Use  . Smoking status: Heavy Tobacco Smoker    Packs/day: 0.50    Types: Cigarettes  . Smokeless tobacco: Never Used  Substance and Sexual Activity  . Alcohol use: Yes    Comment: occasionally  . Drug use: Not Currently    Types: Marijuana  . Sexual activity: Not Currently    Partners: Female  Other Topics Concern  . Not on file  Social History Narrative  . Not on file   Social Determinants of Health   Financial Resource Strain: Not on file  Food Insecurity: Not on file  Transportation Needs: Not on file  Physical Activity: Not on file  Stress: Not on file  Social Connections: Not on file   Additional Social History:    Allergies:  No Known Allergies  Labs:  Results for orders placed or performed during the hospital encounter of 10/22/20 (from the past 48 hour(s))  Resp Panel by RT-PCR (Flu A&B, Covid) Nasopharyngeal Swab     Status: None   Collection Time: 10/22/20 10:16 PM   Specimen: Nasopharyngeal Swab; Nasopharyngeal(NP) swabs in vial transport medium  Result Value Ref Range   SARS  Coronavirus 2 by RT PCR NEGATIVE NEGATIVE    Comment: (NOTE) SARS-CoV-2 target nucleic acids are NOT DETECTED.  The SARS-CoV-2 RNA is generally detectable in upper respiratory specimens during the acute phase of infection. The lowest concentration of SARS-CoV-2 viral copies this assay can detect is 138 copies/mL. A negative result does not preclude SARS-Cov-2 infection and should not be used as the sole basis for treatment or other patient management decisions. A negative result  may occur with  improper specimen collection/handling, submission of specimen other than nasopharyngeal swab, presence of viral mutation(s) within the areas targeted by this assay, and inadequate number of viral copies(<138 copies/mL). A negative result must be combined with clinical observations, patient history, and epidemiological information. The expected result is Negative.  Fact Sheet for Patients:  BloggerCourse.comhttps://www.fda.gov/media/152166/download  Fact Sheet for Healthcare Providers:  SeriousBroker.ithttps://www.fda.gov/media/152162/download  This test is no t yet approved or cleared by the [place] FDA and  has been authorized for detection and/or diagnosis of SARS-CoV-2 by FDA under an Emergency Use Authorization (EUA). This EUA will remain  in effect (meaning this test can be used) for the duration of the COVID-19 declaration under Section 564(b)(1) of the Act, 21 U.S.C.section 360bbb-3(b)(1), unless the authorization is terminated  or revoked sooner.       Influenza A by PCR NEGATIVE NEGATIVE   Influenza B by PCR NEGATIVE NEGATIVE    Comment: (NOTE) The Xpert Xpress SARS-CoV-2/FLU/RSV plus assay is intended as an aid in the diagnosis of influenza from Nasopharyngeal swab specimens and should not be used as a sole basis for treatment. Nasal washings and aspirates are unacceptable for Xpert Xpress SARS-CoV-2/FLU/RSV testing.  Fact Sheet for Patients: BloggerCourse.comhttps://www.fda.gov/media/152166/download  Fact Sheet for Healthcare Providers: SeriousBroker.ithttps://www.fda.gov/media/152162/download  This test is not yet approved or cleared by the [place] FDA and has been authorized for detection and/or diagnosis of SARS-CoV-2 by FDA under an Emergency Use Authorization (EUA). This EUA will remain in effect (meaning this test can be used) for the duration of the COVID-19 declaration under Section 564(b)(1) of the Act, 21 U.S.C. section 360bbb-3(b)(1), unless the authorization is terminated  or revoked.  Performed at [facility], [address] [place], [place] [address]   Comprehensive metabolic panel     Status: Abnormal   Collection Time: 10/22/20 10:16 PM  Result Value Ref Range   Sodium 136 135 - 145 mmol/L   Potassium 3.5 3.5 - 5.1 mmol/L   Chloride 98 98 - 111 mmol/L   CO2 22 22 - 32 mmol/L   Glucose, Bld 82 70 - 99 mg/dL    Comment: Glucose reference range applies only to samples taken after fasting for at least 8 hours.   BUN 24 (H) 6 - 20 mg/dL   Creatinine, Ser 6.041.65 (H) 0.61 - 1.24 mg/dL   Calcium 9.7 8.9 - 54.010.3 mg/dL   Total Protein 8.7 (H) 6.5 - 8.1 g/dL   Albumin 4.9 3.5 - 5.0 g/dL   AST 80 (H) 15 - 41 U/L   ALT 39 0 - 44 U/L   Alkaline Phosphatase 58 38 - 126 U/L   Total Bilirubin 1.8 (H) 0.3 - 1.2 mg/dL   GFR, Estimated 60 (L) >60 mL/min    Comment: (NOTE) Calculated using the CKD-EPI Creatinine Equation (2021)    Anion gap 16 (H) 5 - 15    Comment: Performed at [facility], [address] [place], [place] [address]  Ethanol     Status: None   Collection Time:  10/22/20 10:16 PM  Result Value Ref Range   Alcohol, Ethyl (B) <10 <10 mg/dL    Comment: (NOTE) Lowest detectable limit for serum alcohol is 10 mg/dL.  For medical purposes only. Performed at [facility], [address] [place], [place] [address]   CBC with Diff     Status: Abnormal   Collection Time: 10/22/20 10:16 PM  Result Value Ref Range   WBC 14.8 (H) 4.0 - 10.5 K/uL   RBC 5.13 4.22 - 5.81 MIL/uL   Hemoglobin 16.2 13.0 - 17.0 g/dL   HCT 30.8 65.7 - 84.6 %   MCV 90.4 80.0 - 100.0 fL   MCH 31.6 26.0 - 34.0 pg   MCHC 34.9 30.0 - 36.0 g/dL   RDW 96.2 95.2 - 84.1 %   Platelets 302 150 - 400 K/uL   nRBC 0.0 0.0 - 0.2 %   Neutrophils Relative % 85 %   Neutro Abs 12.4 (H) 1.7 - 7.7 K/uL   Lymphocytes Relative 8 %   Lymphs Abs 1.2 0.7 - 4.0 K/uL   Monocytes Relative 7 %   Monocytes Absolute 1.1 (H) 0.1 - 1.0 K/uL   Eosinophils Relative 0 %    Eosinophils Absolute 0.0 0.0 - 0.5 K/uL   Basophils Relative 0 %   Basophils Absolute 0.1 0.0 - 0.1 K/uL   Immature Granulocytes 0 %   Abs Immature Granulocytes 0.06 0.00 - 0.07 K/uL    Comment: Performed at [facility], [address] [place], [place] [address]  Acetaminophen level     Status: Abnormal   Collection Time: 10/22/20 10:16 PM  Result Value Ref Range   Acetaminophen (Tylenol), Serum <10 (L) 10 - 30 ug/mL    Comment: (NOTE) Therapeutic concentrations vary significantly. A range of 10-30 ug/mL  may be an effective concentration for many patients. However, some  are best treated at concentrations outside of this range. Acetaminophen concentrations >150 ug/mL at 4 hours after ingestion  and >50 ug/mL at 12 hours after ingestion are often associated with  toxic reactions.  Performed at [facility], [address] [place], [place] [address]   Salicylate level     Status: Abnormal   Collection Time: 10/22/20 10:16 PM  Result Value Ref Range   Salicylate Lvl <7.0 (L) 7.0 - 30.0 mg/dL    Comment: Performed at [facility], [address] [place], [place] [address]  Procalcitonin - Baseline     Status: None   Collection Time: 10/22/20 10:16 PM  Result Value Ref Range   Procalcitonin 0.16 ng/mL    Comment:        Interpretation: PCT (Procalcitonin) <= 0.5 ng/mL: Systemic infection (sepsis) is not likely. Local bacterial infection is possible. (NOTE)       Sepsis PCT Algorithm           Lower Respiratory Tract                                      Infection PCT Algorithm    ----------------------------     ----------------------------         PCT < 0.25 ng/mL                PCT < 0.10 ng/mL          Strongly encourage             Strongly discourage  discontinuation of antibiotics    initiation of antibiotics    ----------------------------     -----------------------------       PCT 0.25 - 0.50 ng/mL            PCT 0.10 - 0.25  ng/mL               OR       >80% decrease in PCT            Discourage initiation of                                            antibiotics      Encourage discontinuation           of antibiotics    ----------------------------     -----------------------------         PCT >= 0.50 ng/mL              PCT 0.26 - 0.50 ng/mL               AND        <80% decrease in PCT             Encourage initiation of                                             antibiotics       Encourage continuation           of antibiotics    ----------------------------     -----------------------------        PCT >= 0.50 ng/mL                  PCT > 0.50 ng/mL               AND         increase in PCT                  Strongly encourage                                      initiation of antibiotics    Strongly encourage escalation           of antibiotics                                     -----------------------------                                           PCT <= 0.25 ng/mL                                                 OR                                        >  80% decrease in PCT                                      Discontinue / Do not initiate                                             antibiotics  Performed at [facility], [address] [place], [place] [address]     No current facility-administered medications for this encounter.   Current Outpatient Medications  Medication Sig Dispense Refill  . benztropine (COGENTIN) 0.5 MG tablet Take 1 tablet (0.5 mg total) by mouth at bedtime. (Patient not taking: Reported on 01/29/2020) 30 tablet 0  . risperiDONE (RISPERDAL) 2 MG tablet Take 1 tablet (2 mg total) by mouth at bedtime. (Patient not taking: Reported on 01/29/2020) 30 tablet 0    Musculoskeletal: Strength & Muscle Tone: within normal limits Gait & Station: normal Patient leans: N/A            Psychiatric Specialty Exam:  Presentation  General Appearance: No  data recorded Eye Contact:No data recorded Speech:No data recorded Speech Volume:No data recorded Handedness:No data recorded  Mood and Affect  Mood:No data recorded Affect:No data recorded  Thought Process  Thought Processes:No data recorded Descriptions of Associations:No data recorded Orientation:No data recorded Thought Content:No data recorded History of Schizophrenia/Schizoaffective disorder:No data recorded Duration of Psychotic Symptoms:No data recorded Hallucinations:No data recorded Ideas of Reference:No data recorded Suicidal Thoughts:No data recorded Homicidal Thoughts:No data recorded  Sensorium  Memory:No data recorded Judgment:No data recorded Insight:No data recorded  Executive Functions  Concentration:No data recorded Attention Span:No data recorded Recall:No data recorded Fund of Knowledge:No data recorded Language:No data recorded  Psychomotor Activity  Psychomotor Activity:No data recorded  Assets  Assets:No data recorded  Sleep  Sleep:No data recorded  Physical Exam: Physical Exam Vitals and nursing note reviewed.  Constitutional:      Appearance: Normal appearance.  HENT:     Head: Normocephalic.     Nose: Nose normal.  Pulmonary:     Effort: Pulmonary effort is normal.  Musculoskeletal:        General: Normal range of motion.     Cervical back: Normal range of motion.  Neurological:     General: No focal deficit present.     Mental Status: He is alert and oriented to person, place, and time.  Psychiatric:        Attention and Perception: He is inattentive.        Mood and Affect: Mood is anxious.        Speech: Speech normal.        Behavior: Behavior normal. Behavior is cooperative.        Thought Content: Thought content normal.        Cognition and Memory: Cognition and memory normal.        Judgment: Judgment is inappropriate.    Review of Systems  Psychiatric/Behavioral: Positive for substance abuse. The patient is  nervous/anxious.   All other systems reviewed and are negative.  Blood pressure 123/80, pulse 70, temperature 98 F (36.7 C), temperature source Oral, resp. rate 12, height  (1.727 m), weight 70 kg, SpO2 99 %. Body mass index is 23.46 kg/m.  Treatment Plan Summary: Daily contact with patient to assess  and evaluate symptoms and progress in treatment, Medication management and Plan Stimulant use disorder:  -Started gabapentin 300 mg TID  -Should be stable for discharge in the am -Refrain from alcohol and drug use -Attend AA/NA meeting with sponsor -Declines rehab recommendation  Disposition: No evidence of imminent risk to self or others at present.   Supportive therapy provided about ongoing stressors.  [name], NP 10/23/2020 5:20 PM

## 2020-10-23 NOTE — BH Assessment (Signed)
Comprehensive Clinical Assessment (CCA) Screening, Triage and Referral Note  10/23/2020 Ronald [name] [id number]  Chief Complaint:  Chief Complaint  Patient presents with  . Psychiatric Evaluation   Visit Diagnosis: Stimulant-induced psychotic disorder   Patient is a 25 yo male who presents to [facility] ED involuntarily for treatment. Per triage note, Patient arrives via police [facility]) for psychiatric evaluation. [employer] Department report the patient was found barricading himself in a motel room claiming people were coming to kill him. Police report patient was combative, having visual and auditory hallucinations, and was uncooperative on scene. Dr. Katrinka Blazing to bedside on patient arrival. Security assisting with patient at this time. Patient arrived to ED in handcuffs. During TTS assessment patient appeared to still be impaired and under the influence of substances. However, UDS was not completed by the time of assessment. Patient was also very restless and uncooperative. The pt does not appear to be responding to internal or external stimuli. Neither is the pt presenting with any delusional thinking. Patient not willing to fully participate in assessment after several attempts.  Patient Reported Information How did you hear about [place]? -- (Police Dept)   Referral name: Police Dept   Referral phone number: [phone]  Whom do you see for routine medical problems? Hospital ER   Practice/Facility Name: No data recorded  Practice/Facility Phone Number: No data recorded  Name of Contact: No data recorded  Contact Number: No data recorded  Contact Fax Number: No data recorded  Prescriber Name: No data recorded  Prescriber Address (if known): No data recorded What Is the Reason for Your Visit/Call Today? No data recorded How Long Has This Been Causing You Problems? > than 6 months  Have You Recently Been in Any Inpatient Treatment (Hospital/Detox/Crisis Center/28-Day  Program)? No   Name/Location of Program/Hospital:No data recorded  How Long Were You There? No data recorded  When Were You Discharged? No data recorded Have You Ever Received Services From [facility] Before? Yes   Who Do You See at [facility]? ER  Have You Recently Had Any Thoughts About Hurting Yourself? No   Are You Planning to Commit Suicide/Harm Yourself At This time?  No  Have you Recently Had Thoughts About Hurting Someone Karolee Ohs? No   Explanation: No data recorded Have You Used Any Alcohol or Drugs in the Past 24 Hours? Yes   How Long Ago Did You Use Drugs or Alcohol?  No data recorded  What Did You Use and How Much? Substance Unknown - UDS not completed  What Do You Feel Would Help You the Most Today? Alcohol or Drug Use Treatment  Do You Currently Have a Therapist/Psychiatrist? No   Name of Therapist/Psychiatrist: No data recorded  Have You Been Recently Discharged From Any Office Practice or Programs? No   Explanation of Discharge From Practice/Program:  No data recorded    CCA Screening Triage Referral Assessment Type of Contact: Face-to-Face   Is this Initial or Reassessment? No data recorded  Date Telepsych consult ordered in [facility]:  No data recorded  Time Telepsych consult ordered in [facility]:  No data recorded Patient Reported Information Reviewed? Yes   Patient Left Without Being Seen? No data recorded  Reason for Not Completing Assessment: No data recorded Collateral Involvement: No data recorded Does Patient Have a Court Appointed Legal Guardian? No data recorded  Name and Contact of Legal Guardian:  Self  If Minor and Not Living with Parent(s), Who has Custody? No data recorded Is CPS involved  or ever been involved? Never  Is APS involved or ever been involved? Never  Patient Determined To Be At Risk for Harm To Self or Others Based on Review of Patient Reported Information or Presenting Complaint? No   Method: No data recorded  Availability of  Means: No data recorded  Intent: No data recorded  Notification Required: No data recorded  Additional Information for Danger to Others Potential:  No data recorded  Additional Comments for Danger to Others Potential:  No data recorded  Are There Guns or Other Weapons in Your Home?  No data recorded   Types of Guns/Weapons: No data recorded   Are These Weapons Safely Secured?                              No data recorded   Who Could Verify You Are Able To Have These Secured:    No data recorded Do You Have any Outstanding Charges, Pending Court Dates, Parole/Probation? No data recorded Contacted To Inform of Risk of Harm To Self or Others: No data recorded Location of Assessment: [facility] ED  Does Patient Present under Involuntary Commitment? Yes   IVC Papers Initial File Date: 10/22/2020   [place] of Residence: [place]  Patient Currently Receiving the Following Services: Not Receiving Services   Determination of Need: Emergent (2 hours)   Options For Referral: Outpatient Therapy   Moody Bruins, [facility]

## 2020-10-23 NOTE — ED Notes (Signed)
PT sleeping, will assess vitals when awake.

## 2020-10-23 NOTE — ED Notes (Signed)
Refused vitals at this time. Asked to be left alone

## 2020-10-24 DIAGNOSIS — F15959 Other stimulant use, unspecified with stimulant-induced psychotic disorder, unspecified: Secondary | ICD-10-CM | POA: Diagnosis not present

## 2020-10-24 LAB — BASIC METABOLIC PANEL
Anion gap: 7 (ref 5–15)
BUN: 19 mg/dL (ref 6–20)
CO2: 28 mmol/L (ref 22–32)
Calcium: 9.1 mg/dL (ref 8.9–10.3)
Chloride: 102 mmol/L (ref 98–111)
Creatinine, Ser: 1.17 mg/dL (ref 0.61–1.24)
GFR, Estimated: >60 mL/min (ref >60)
Glucose, Bld: 145 mg/dL — ABNORMAL HIGH (ref 70–99)
Potassium: 4.4 mmol/L (ref 3.5–5.1)
Sodium: 137 mmol/L (ref 135–145)

## 2020-10-24 MED ORDER — RISPERIDONE 1 MG PO TBDP
1.0000 mg | ORAL_TABLET | Freq: Two times a day (BID) | ORAL | Status: DC
  Administered 2020-10-24 – 2020-10-25 (×2): 1 mg via ORAL
  Filled 2020-10-24 (×4): qty 1

## 2020-10-24 NOTE — ED Notes (Signed)
Hourly rounding performed, patient currently awake in room. Patient has no complaints at this time. Q15 minute rounds and monitoring via Security Cameras to continue. 

## 2020-10-24 NOTE — ED Notes (Signed)
Hourly rounding performed, patient currently asleep in room. Patient has no complaints at this time. Q15 minute rounds and monitoring via Security Cameras to continue. 

## 2020-10-24 NOTE — ED Notes (Signed)
Report received from Jennifer, RN including SBAR. Patient alert and oriented, warm and dry, in no acute distress. Patient denies SI, HI, AVH and pain. Patient made aware of Q15 minute rounds and security cameras for their safety. Patient instructed to come to this nurse with needs or concerns.  

## 2020-10-24 NOTE — ED Notes (Signed)
DSS at the bedside

## 2020-10-24 NOTE — ED Notes (Signed)
Gave food tray with drink. 

## 2020-10-24 NOTE — Consult Note (Signed)
[facility] Face-to-Face Psychiatry Consult   Reason for Consult: Consult to continue following up with this 25 year old who was brought to the hospital with acute psychosis Referring Physician: Cyril Loosen Patient Identification: Ronald [name] MRN:  [id number] Principal Diagnosis: Stimulant-induced psychotic disorder (HCC) Diagnosis:  Principal Problem:   Stimulant-induced psychotic disorder (HCC)   Total Time spent with patient: 45 minutes  Subjective:   Ronald [name] is a 25 y.o. male patient admitted with "you know what it says, they were trying to kill me".  HPI: Patient seen chart reviewed.  Patient was brought to the emergency room on the 28th by law enforcement.  Apparently they were called to the motel where he was staying because he had barricaded himself in the room and was making a disturbance.  Patient was agitated and insisting that people were trying to kill him when brought to the hospital.  Labs obtained show some electrolyte abnormalities and an elevated white count.  No drug screen has yet been obtained.  On interview today the patient remains very animated.  He is hyperverbal and hyperactive.  Fidgety.  Bounces on the bed during conversation.  Constantly changing position.  Giggling during the conversation although he seems to be sincere and insisting he believes that people outside the hospital are trying to kill him.  Patient admitted that he had been using drugs said that his last use of amphetamine was a about 3 days ago.  He either cannot remember or declines to give much detailed information about it.  Denies any suicidal or homicidal thought  Past Psychiatric History: Patient has a past history of presentation with agitation and mood symptoms and possible psychosis related to alcohol abuse.  Has had other presentations to the ER with evidence of stimulant abuse.  No consistent outpatient treatment  Risk to Self:   Risk to Others:   Prior Inpatient Therapy:   Prior Outpatient  Therapy:    Past Medical History:  Past Medical History:  Diagnosis Date  . Asthma    History reviewed. No pertinent surgical history. Family History: History reviewed. No pertinent family history. Family Psychiatric  History: Does not know of any Social History:  Social History   Substance and Sexual Activity  Alcohol Use Yes   Comment: occasionally     Social History   Substance and Sexual Activity  Drug Use Not Currently  . Types: Marijuana    Social History   Socioeconomic History  . Marital status: Single    Spouse name: Not on file  . Number of children: Not on file  . Years of education: Not on file  . Highest education level: Not on file  Occupational History  . Not on file  Tobacco Use  . Smoking status: Heavy Tobacco Smoker    Packs/day: 0.50    Types: Cigarettes  . Smokeless tobacco: Never Used  Substance and Sexual Activity  . Alcohol use: Yes    Comment: occasionally  . Drug use: Not Currently    Types: Marijuana  . Sexual activity: Not Currently    Partners: Female  Other Topics Concern  . Not on file  Social History Narrative  . Not on file   Social Determinants of Health   Financial Resource Strain: Not on file  Food Insecurity: Not on file  Transportation Needs: Not on file  Physical Activity: Not on file  Stress: Not on file  Social Connections: Not on file   Additional Social History:    Allergies:  No Known Allergies  Labs:  Results for orders placed or performed during the hospital encounter of 10/22/20 (from the past 48 hour(s))  Resp Panel by RT-PCR (Flu A&B, Covid) Nasopharyngeal Swab     Status: None   Collection Time: 10/22/20 10:16 PM   Specimen: Nasopharyngeal Swab; Nasopharyngeal(NP) swabs in vial transport medium  Result Value Ref Range   SARS Coronavirus 2 by RT PCR NEGATIVE NEGATIVE    Comment: (NOTE) SARS-CoV-2 target nucleic acids are NOT DETECTED.  The SARS-CoV-2 RNA is generally detectable in upper  respiratory specimens during the acute phase of infection. The lowest concentration of SARS-CoV-2 viral copies this assay can detect is 138 copies/mL. A negative result does not preclude SARS-Cov-2 infection and should not be used as the sole basis for treatment or other patient management decisions. A negative result may occur with  improper specimen collection/handling, submission of specimen other than nasopharyngeal swab, presence of viral mutation(s) within the areas targeted by this assay, and inadequate number of viral copies(<138 copies/mL). A negative result must be combined with clinical observations, patient history, and epidemiological information. The expected result is Negative.  Fact Sheet for Patients:  BloggerCourse.com  Fact Sheet for Healthcare Providers:  SeriousBroker.it  This test is no t yet approved or cleared by the [place] FDA and  has been authorized for detection and/or diagnosis of SARS-CoV-2 by FDA under an Emergency Use Authorization (EUA). This EUA will remain  in effect (meaning this test can be used) for the duration of the COVID-19 declaration under Section 564(b)(1) of the Act, 21 U.S.C.section 360bbb-3(b)(1), unless the authorization is terminated  or revoked sooner.       Influenza A by PCR NEGATIVE NEGATIVE   Influenza B by PCR NEGATIVE NEGATIVE    Comment: (NOTE) The Xpert Xpress SARS-CoV-2/FLU/RSV plus assay is intended as an aid in the diagnosis of influenza from Nasopharyngeal swab specimens and should not be used as a sole basis for treatment. Nasal washings and aspirates are unacceptable for Xpert Xpress SARS-CoV-2/FLU/RSV testing.  Fact Sheet for Patients: BloggerCourse.com  Fact Sheet for Healthcare Providers: SeriousBroker.it  This test is not yet approved or cleared by the [place] FDA and has been authorized for  detection and/or diagnosis of SARS-CoV-2 by FDA under an Emergency Use Authorization (EUA). This EUA will remain in effect (meaning this test can be used) for the duration of the COVID-19 declaration under Section 564(b)(1) of the Act, 21 U.S.C. section 360bbb-3(b)(1), unless the authorization is terminated or revoked.  Performed at [facility], [address] [place], [place] [address]   Comprehensive metabolic panel     Status: Abnormal   Collection Time: 10/22/20 10:16 PM  Result Value Ref Range   Sodium 136 135 - 145 mmol/L   Potassium 3.5 3.5 - 5.1 mmol/L   Chloride 98 98 - 111 mmol/L   CO2 22 22 - 32 mmol/L   Glucose, Bld 82 70 - 99 mg/dL    Comment: Glucose reference range applies only to samples taken after fasting for at least 8 hours.   BUN 24 (H) 6 - 20 mg/dL   Creatinine, Ser 9.70 (H) 0.61 - 1.24 mg/dL   Calcium 9.7 8.9 - 26.3 mg/dL   Total Protein 8.7 (H) 6.5 - 8.1 g/dL   Albumin 4.9 3.5 - 5.0 g/dL   AST 80 (H) 15 - 41 U/L   ALT 39 0 - 44 U/L   Alkaline Phosphatase 58 38 - 126 U/L   Total Bilirubin 1.8 (H) 0.3 -  1.2 mg/dL   GFR, Estimated 60 (L) >60 mL/min    Comment: (NOTE) Calculated using the CKD-EPI Creatinine Equation (2021)    Anion gap 16 (H) 5 - 15    Comment: Performed at [facility], [address] [place], [place] [address]  Ethanol     Status: None   Collection Time: 10/22/20 10:16 PM  Result Value Ref Range   Alcohol, Ethyl (B) <10 <10 mg/dL    Comment: (NOTE) Lowest detectable limit for serum alcohol is 10 mg/dL.  For medical purposes only. Performed at [facility], [address] [place], [place] [address]   CBC with Diff     Status: Abnormal   Collection Time: 10/22/20 10:16 PM  Result Value Ref Range   WBC 14.8 (H) 4.0 - 10.5 K/uL   RBC 5.13 4.22 - 5.81 MIL/uL   Hemoglobin 16.2 13.0 - 17.0 g/dL   HCT 09.9 83.3 - 82.5 %   MCV 90.4 80.0 - 100.0 fL   MCH 31.6 26.0 - 34.0 pg   MCHC 34.9 30.0 - 36.0  g/dL   RDW 05.3 97.6 - 73.4 %   Platelets 302 150 - 400 K/uL   nRBC 0.0 0.0 - 0.2 %   Neutrophils Relative % 85 %   Neutro Abs 12.4 (H) 1.7 - 7.7 K/uL   Lymphocytes Relative 8 %   Lymphs Abs 1.2 0.7 - 4.0 K/uL   Monocytes Relative 7 %   Monocytes Absolute 1.1 (H) 0.1 - 1.0 K/uL   Eosinophils Relative 0 %   Eosinophils Absolute 0.0 0.0 - 0.5 K/uL   Basophils Relative 0 %   Basophils Absolute 0.1 0.0 - 0.1 K/uL   Immature Granulocytes 0 %   Abs Immature Granulocytes 0.06 0.00 - 0.07 K/uL    Comment: Performed at [facility], [address] [place], [place] [address]  Acetaminophen level     Status: Abnormal   Collection Time: 10/22/20 10:16 PM  Result Value Ref Range   Acetaminophen (Tylenol), Serum <10 (L) 10 - 30 ug/mL    Comment: (NOTE) Therapeutic concentrations vary significantly. A range of 10-30 ug/mL  may be an effective concentration for many patients. However, some  are best treated at concentrations outside of this range. Acetaminophen concentrations >150 ug/mL at 4 hours after ingestion  and >50 ug/mL at 12 hours after ingestion are often associated with  toxic reactions.  Performed at [facility], [address] [place], [place] [address]   Salicylate level     Status: Abnormal   Collection Time: 10/22/20 10:16 PM  Result Value Ref Range   Salicylate Lvl <7.0 (L) 7.0 - 30.0 mg/dL    Comment: Performed at [facility], [address] [place], [place] [address]  Procalcitonin - Baseline     Status: None   Collection Time: 10/22/20 10:16 PM  Result Value Ref Range   Procalcitonin 0.16 ng/mL    Comment:        Interpretation: PCT (Procalcitonin) <= 0.5 ng/mL: Systemic infection (sepsis) is not likely. Local bacterial infection is possible. (NOTE)       Sepsis PCT Algorithm           Lower Respiratory Tract                                      Infection PCT Algorithm    ----------------------------      ----------------------------  PCT < 0.25 ng/mL                PCT < 0.10 ng/mL          Strongly encourage             Strongly discourage   discontinuation of antibiotics    initiation of antibiotics    ----------------------------     -----------------------------       PCT 0.25 - 0.50 ng/mL            PCT 0.10 - 0.25 ng/mL               OR       >80% decrease in PCT            Discourage initiation of                                            antibiotics      Encourage discontinuation           of antibiotics    ----------------------------     -----------------------------         PCT >= 0.50 ng/mL              PCT 0.26 - 0.50 ng/mL               AND        <80% decrease in PCT             Encourage initiation of                                             antibiotics       Encourage continuation           of antibiotics    ----------------------------     -----------------------------        PCT >= 0.50 ng/mL                  PCT > 0.50 ng/mL               AND         increase in PCT                  Strongly encourage                                      initiation of antibiotics    Strongly encourage escalation           of antibiotics                                     -----------------------------                                           PCT <= 0.25 ng/mL  OR                                        > 80% decrease in PCT                                      Discontinue / Do not initiate                                             antibiotics  Performed at [facility], [address] [place], [place] [address]     Current Facility-Administered Medications  Medication Dose Route Frequency Provider Last Rate Last Admin  . risperiDONE (RISPERDAL M-TABS) disintegrating tablet 1 mg  1 mg Oral BID Katiria Calame, Jackquline Denmark, MD       Current Outpatient Medications  Medication Sig Dispense Refill  . benztropine  (COGENTIN) 0.5 MG tablet Take 1 tablet (0.5 mg total) by mouth at bedtime. (Patient not taking: Reported on 01/29/2020) 30 tablet 0  . risperiDONE (RISPERDAL) 2 MG tablet Take 1 tablet (2 mg total) by mouth at bedtime. (Patient not taking: Reported on 01/29/2020) 30 tablet 0    Musculoskeletal: Strength & Muscle Tone: within normal limits Gait & Station: normal Patient leans: N/A            Psychiatric Specialty Exam:  Presentation  General Appearance: No data recorded Eye Contact:No data recorded Speech:No data recorded Speech Volume:No data recorded Handedness:No data recorded  Mood and Affect  Mood:No data recorded Affect:No data recorded  Thought Process  Thought Processes:No data recorded Descriptions of Associations:No data recorded Orientation:No data recorded Thought Content:No data recorded History of Schizophrenia/Schizoaffective disorder:No data recorded Duration of Psychotic Symptoms:No data recorded Hallucinations:No data recorded Ideas of Reference:No data recorded Suicidal Thoughts:No data recorded Homicidal Thoughts:No data recorded  Sensorium  Memory:No data recorded Judgment:No data recorded Insight:No data recorded  Executive Functions  Concentration:No data recorded Attention Span:No data recorded Recall:No data recorded Fund of Knowledge:No data recorded Language:No data recorded  Psychomotor Activity  Psychomotor Activity:No data recorded  Assets  Assets:No data recorded  Sleep  Sleep:No data recorded  Physical Exam: Physical Exam Vitals and nursing note reviewed.  Constitutional:      Appearance: Normal appearance.  HENT:     Head: Normocephalic and atraumatic.     Mouth/Throat:     Pharynx: Oropharynx is clear.  Eyes:     Pupils: Pupils are equal, round, and reactive to light.  Cardiovascular:     Rate and Rhythm: Normal rate and regular rhythm.  Pulmonary:     Effort: Pulmonary effort is normal.     Breath sounds:  Normal breath sounds.  Abdominal:     General: Abdomen is flat.     Palpations: Abdomen is soft.  Musculoskeletal:        General: Normal range of motion.  Skin:    General: Skin is warm and dry.  Neurological:     General: No focal deficit present.     Mental Status: He is alert. Mental status is at baseline.  Psychiatric:        Attention and Perception: He is inattentive.        Mood and Affect:  Mood is elated. Affect is labile and inappropriate.        Speech: Speech is rapid and pressured and tangential.        Behavior: Behavior is agitated.        Thought Content: Thought content is paranoid. Thought content does not include homicidal or suicidal ideation.        Cognition and Memory: Cognition is impaired. Memory is impaired.        Judgment: Judgment is impulsive and inappropriate.    Review of Systems  Constitutional: Negative.   HENT: Negative.   Eyes: Negative.   Respiratory: Negative.   Cardiovascular: Negative.   Gastrointestinal: Negative.   Musculoskeletal: Negative.   Skin: Negative.   Neurological: Negative.   Psychiatric/Behavioral: Positive for memory loss and substance abuse. Negative for depression, hallucinations and suicidal ideas. The patient is nervous/anxious. The patient does not have insomnia.    Blood pressure (!) 118/59, pulse 82, temperature 98 F (36.7 C), temperature source Oral, resp. rate 17, height 5\' 8"  (1.727 m), weight 70 kg, SpO2 98 %. Body mass index is 23.46 kg/m.  Treatment Plan Summary: Medication management and Plan 25 year old man with a past history of substance abuse related psychosis continues to have manic-like psychotic symptoms.  Differential diagnosis includes substance-induced psychosis versus mania or schizophrenia.  The content of his delusions is extremely typical for amphetamine abuse but certainly could be an underlying psychiatric disorder as well.  Currently still just organized with poor judgment.  Continue  commitment.  Medicines reviewed.  Discontinue gabapentin and start risperidone 1 mg twice a day.  Recheck basic metabolic panel to see if electrolytes have stabilized.  Patient currently will remain under IVC and be referred for admission and have daily reassessment.  Case reviewed with the ER doctor.  Disposition: Recommend psychiatric Inpatient admission when medically cleared.  Mordecai RasmussenJohn Iracema Lanagan, MD 10/24/2020 11:30 AM

## 2020-10-24 NOTE — ED Notes (Signed)
IVC/Pending Placement after reassessment

## 2020-10-24 NOTE — ED Notes (Signed)
IVC, pending discharge in AM

## 2020-10-24 NOTE — ED Notes (Signed)
Dr. Clapacs at the bedside  

## 2020-10-24 NOTE — ED Notes (Signed)
Hourly rounding reveals patient in room. No complaints, stable, in no acute distress. Q15 minute rounds and monitoring via Security Cameras to continue. 

## 2020-10-24 NOTE — BH Assessment (Addendum)
Referral information for Psychiatric Hospitalization faxed to;   [facility] [phone]), Declined by provider due to labs  . [facility] [phone]), No answer at any of the 3 numbers listed  . [facility] [phone]), No adult beds currently available  . [facility] [phone] -or- [phone]), Tiffany reports no adult beds available at this time  . [facility] [phone]). No answer at this time, unable to leave a voicemail  . [facility] [phone]) Misty Stanley reports patient has not been reviewed yet

## 2020-10-24 NOTE — Progress Notes (Signed)
Patient meets inpatient criteria per Mordecai Rasmussen, MD. Patient referred to the following facilities:  Destination  Service Provider Address Phone Fax  [facility]  [address] [place] [address] [phone]  [facility] [address]  [address] [place] [address] [phone]  [facility]  [address] [place], [place] [address] [phone]  [facility]  [address] [place] [address] [phone]  [facility]  [address] [place] [address] [phone]  [facility]  [address] [place] [address] [phone]  [facility]  [address] [place] [address] [phone]  [facility]  [address] [place], [place] [address] [phone]  [facility]  [address] [place] [address] [phone]  [facility]  [address], [place] [address] [phone]  [facility]  [address] [place] [address] [phone]     CSW will continue to monitor disposition.   Signed:  Corky Crafts, MSW, [place], LCASA 10/24/2020 12:05 PM

## 2020-10-24 NOTE — ED Notes (Signed)
Drink and snack tray provided

## 2020-10-25 MED ORDER — RISPERIDONE 1 MG PO TBDP: 1.0000 mg | ORAL_TABLET | Freq: Two times a day (BID) | ORAL | 1 refills | Status: DC

## 2020-10-25 NOTE — ED Notes (Signed)
Hourly rounding performed, patient currently asleep in room. Patient has no complaints at this time. Q15 minute rounds and monitoring via Security Cameras to continue. 

## 2020-10-25 NOTE — ED Notes (Signed)
Hourly rounding performed, patient currently  Asleep in room. Patient has no complaints at this time. Q15 minute rounds and monitoring via [employer] to continue.

## 2020-10-25 NOTE — Consult Note (Signed)
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 male patient admitted with "I realize I need to make some money".  HPI: Patient seen.  Patient slept last night woke up today calmer.  In conversation denies believing that anyone is trying to kill him.  Says he is mainly concerned with getting out of the hospital and making some money.  Denies suicidal ideation.  Appears to be organized in his thinking.  Patient was counseled about the dangers of amphetamine use  Past Psychiatric History: Recurrent presentation with drug-induced psychosis  Risk to Self:   Risk to Others:   Prior Inpatient Therapy:   Prior Outpatient Therapy:    Past Medical History:  Past Medical History:  Diagnosis Date  . Asthma    History reviewed. No pertinent surgical history. Family History: History reviewed. No pertinent family history. Family Psychiatric  History: None Social History:  Social History   Substance and Sexual Activity  Alcohol Use Yes   Comment: occasionally     Social History   Substance and Sexual Activity  Drug Use Not Currently  . Types: Marijuana    Social History   Socioeconomic History  . Marital status: Single    Spouse name: Not on file  . Number of children: Not on file  . Years of education: Not on file  . Highest education level: Not on file  Occupational History  . Not on file  Tobacco Use  . Smoking status: Heavy Tobacco Smoker    Packs/day: 0.50    Types: Cigarettes  . Smokeless tobacco: Never Used  Substance and Sexual Activity  . Alcohol use: Yes     Comment: occasionally  . Drug use: Not Currently    Types: Marijuana  . Sexual activity: Not Currently    Partners: Female  Other Topics Concern  . Not on file  Social History Narrative  . Not on file   Social Determinants of Health   Financial Resource Strain: Not on file  Food Insecurity: Not on file  Transportation Needs: Not on file  Physical Activity: Not on file  Stress: Not on file  Social Connections: Not on file   Additional Social History:    Allergies:  No Known Allergies  Labs:  Results for orders placed or performed during the hospital encounter of 10/22/20 (from the past 48 hour(s))  Basic metabolic panel     Status: Abnormal   Collection Time: 10/24/20  1:43 PM  Result Value Ref Range   Sodium 137 135 - 145 mmol/L   Potassium 4.4 3.5 - 5.1 mmol/L   Chloride 102 98 - 111 mmol/L   CO2 28 22 - 32 mmol/L   Glucose, Bld 145 (H) 70 - 99 mg/dL    Comment: Glucose reference range applies only to samples taken after fasting for at least 8 hours.   BUN 19 6 - 20 mg/dL   Creatinine, Ser 5.00 0.61 - 1.24 mg/dL   Calcium 9.1 8.9 - 37.0 mg/dL   GFR, Estimated >48 >88 mL/min    Comment: (NOTE) Calculated using the CKD-EPI Creatinine Equation (2021)    Anion gap 7 5 - 15    Comment: Performed at  [facility], [address], [place], [place] [address]    Current Facility-Administered Medications  Medication Dose Route Frequency Provider Last Rate Last Admin  . risperiDONE (RISPERDAL M-TABS) disintegrating tablet 1 mg  1 mg Oral BID Zelphia Glover, Jackquline Denmark, MD   1 mg at 10/25/20 1011   Current Outpatient Medications  Medication Sig Dispense Refill  . benztropine (COGENTIN) 0.5 MG tablet Take 1 tablet (0.5 mg total) by mouth at bedtime. (Patient not taking: Reported on 01/29/2020) 30 tablet 0  . risperiDONE (RISPERDAL M-TABS) 1 MG disintegrating tablet Take 1 tablet (1 mg total) by mouth 2 (two) times daily. 60 tablet 1    Musculoskeletal: Strength & Muscle Tone:  within normal limits Gait & Station: normal Patient leans: N/A            Psychiatric Specialty Exam:  Presentation  General Appearance: No data recorded Eye Contact:No data recorded Speech:No data recorded Speech Volume:No data recorded Handedness:No data recorded  Mood and Affect  Mood:No data recorded Affect:No data recorded  Thought Process  Thought Processes:No data recorded Descriptions of Associations:No data recorded Orientation:No data recorded Thought Content:No data recorded History of Schizophrenia/Schizoaffective disorder:No data recorded Duration of Psychotic Symptoms:No data recorded Hallucinations:No data recorded Ideas of Reference:No data recorded Suicidal Thoughts:No data recorded Homicidal Thoughts:No data recorded  Sensorium  Memory:No data recorded Judgment:No data recorded Insight:No data recorded  Executive Functions  Concentration:No data recorded Attention Span:No data recorded Recall:No data recorded Fund of Knowledge:No data recorded Language:No data recorded  Psychomotor Activity  Psychomotor Activity:No data recorded  Assets  Assets:No data recorded  Sleep  Sleep:No data recorded  Physical Exam: Physical Exam Vitals and nursing note reviewed.  Constitutional:      Appearance: Normal appearance.  HENT:     Head: Normocephalic and atraumatic.     Mouth/Throat:     Pharynx: Oropharynx is clear.  Eyes:     Pupils: Pupils are equal, round, and reactive to light.  Cardiovascular:     Rate and Rhythm: Normal rate and regular rhythm.  Pulmonary:     Effort: Pulmonary effort is normal.     Breath sounds: Normal breath sounds.  Abdominal:     General: Abdomen is flat.     Palpations: Abdomen is soft.  Musculoskeletal:        General: Normal range of motion.  Skin:    General: Skin is warm and dry.  Neurological:     General: No focal deficit present.     Mental Status: He is alert. Mental status is at baseline.   Psychiatric:        Mood and Affect: Mood normal.        Thought Content: Thought content normal.    Review of Systems  Constitutional: Negative.   HENT: Negative.   Eyes: Negative.   Respiratory: Negative.   Cardiovascular: Negative.   Gastrointestinal: Negative.   Musculoskeletal: Negative.   Skin: Negative.   Neurological: Negative.   Psychiatric/Behavioral: Negative.    Blood pressure 109/65, pulse 74, temperature 98.5 F (36.9 C), temperature source Oral, resp. rate 16, height 5\' 8"  (1.727 m), weight 70 kg, SpO2 97 %. Body mass index is 23.46 kg/m.  Treatment Plan Summary: Medication management and Plan Patient at this point seems to have resolved his drug-induced psychosis.  No aggression no reports of dangerousness in his behavior.  Patient no longer meets criteria for commitment or admission and can be discharged.  Counseling done about his sensitivity to stimulants causing psychosis.  He will be referred to [facility] and given the referral phone number and information.  Additionally I will continue him on his current Risperdal which seems to have helped and a prescription is prepared for that.  Case reviewed with the ER doctor.  Discontinued IVC.  Disposition: No evidence of imminent risk to self or others at present.   Patient does not meet criteria for psychiatric inpatient admission. Supportive therapy provided about ongoing stressors. Discussed crisis plan, support from social network, calling 911, coming to the Emergency Department, and calling Suicide Hotline.  Mordecai Rasmussen, MD 10/25/2020 2:19 PM

## 2020-10-25 NOTE — ED Notes (Signed)
IVC, pending placement 

## 2020-10-25 NOTE — ED Notes (Signed)
PT given breakfast tray and gave this NT back shower supplies and oral care.

## 2020-10-25 NOTE — ED Notes (Signed)
Pt given tray

## 2020-10-25 NOTE — ED Notes (Signed)
IVC PAPERS  RESCINDED PER  DR  CLAPACS MD  INFORMED  BILL  RN

## 2020-10-25 NOTE — ED Notes (Addendum)
This NT gave Pt one washcloth,2 towels, new clothing, oral care, shampoo and bodywash for shower. Checked shower before Pt went in.

## 2020-10-25 NOTE — ED Provider Notes (Signed)
Emergency Medicine Observation Re-evaluation Note  Ronald [name] is a 25 y.o. male, seen on rounds today.  Pt initially presented to the ED for complaints of Psychiatric Evaluation  Currently, the patient is calm, no acute complaints.  Physical Exam  Blood pressure 109/65, pulse 74, temperature 98.5 F (36.9 C), temperature source Oral, resp. rate 16, height 5\' 8"  (1.727 m), weight 70 kg, SpO2 97 %. Physical Exam General: NAD Lungs: CTAB Psych: not agitated  ED Course / MDM  EKG:    I have reviewed the labs performed to date as well as medications administered while in observation.  Recent changes in the last 24 hours include no acute events overnight.  Seen by psychiatry today who finds the patient stabilized, believes that he had drug-induced psychosis which has resolved.  Plan  Current plan is for discharge home. Patient is not under full IVC at this time.   , MD 10/25/20 [phone]

## 2020-10-25 NOTE — ED Notes (Signed)
Pt asleep at this time, unable to collect vitals. Will collect pt vitals once awake. 

## 2021-04-05 ENCOUNTER — Other Ambulatory Visit: Payer: Self-pay

## 2021-04-05 ENCOUNTER — Emergency Department
Admission: EM | Admit: 2021-04-05 | Discharge: 2021-04-07 | Disposition: A | Discharge status: Home / Self Care | Payer: No Typology Code available for payment source | Attending: Emergency Medicine | Admitting: Emergency Medicine

## 2021-04-05 DIAGNOSIS — F23 Brief psychotic disorder: Secondary | ICD-10-CM | POA: Diagnosis not present

## 2021-04-05 DIAGNOSIS — J45909 Unspecified asthma, uncomplicated: Secondary | ICD-10-CM | POA: Diagnosis not present

## 2021-04-05 DIAGNOSIS — F15959 Other stimulant use, unspecified with stimulant-induced psychotic disorder, unspecified: Secondary | ICD-10-CM | POA: Diagnosis not present

## 2021-04-05 DIAGNOSIS — Z79899 Other long term (current) drug therapy: Secondary | ICD-10-CM | POA: Insufficient documentation

## 2021-04-05 DIAGNOSIS — Z20822 Contact with and (suspected) exposure to covid-19: Secondary | ICD-10-CM | POA: Insufficient documentation

## 2021-04-05 DIAGNOSIS — Y900 Blood alcohol level of less than 20 mg/100 ml: Secondary | ICD-10-CM | POA: Insufficient documentation

## 2021-04-05 DIAGNOSIS — F17210 Nicotine dependence, cigarettes, uncomplicated: Secondary | ICD-10-CM | POA: Insufficient documentation

## 2021-04-05 DIAGNOSIS — F29 Unspecified psychosis not due to a substance or known physiological condition: Secondary | ICD-10-CM

## 2021-04-05 DIAGNOSIS — J101 Influenza due to other identified influenza virus with other respiratory manifestations: Secondary | ICD-10-CM | POA: Diagnosis not present

## 2021-04-05 DIAGNOSIS — F309 Manic episode, unspecified: Secondary | ICD-10-CM | POA: Diagnosis present

## 2021-04-05 LAB — CBC
HCT: 42.2 % (ref 39.0–52.0)
Hemoglobin: 14.1 g/dL (ref 13.0–17.0)
MCH: 30.2 pg (ref 26.0–34.0)
MCHC: 33.4 g/dL (ref 30.0–36.0)
MCV: 90.4 fL (ref 80.0–100.0)
Platelets: 307 10*3/uL (ref 150–400)
RBC: 4.67 MIL/uL (ref 4.22–5.81)
RDW: 12.7 % (ref 11.5–15.5)
WBC: 10.2 10*3/uL (ref 4.0–10.5)
nRBC: 0 % (ref 0.0–0.2)

## 2021-04-05 LAB — COMPREHENSIVE METABOLIC PANEL
ALT: 16 U/L (ref 0–44)
AST: 28 U/L (ref 15–41)
Albumin: 3.8 g/dL (ref 3.5–5.0)
Alkaline Phosphatase: 50 U/L (ref 38–126)
Anion gap: 8 (ref 5–15)
BUN: 15 mg/dL (ref 6–20)
CO2: 24 mmol/L (ref 22–32)
Calcium: 9.2 mg/dL (ref 8.9–10.3)
Chloride: 102 mmol/L (ref 98–111)
Creatinine, Ser: 0.91 mg/dL (ref 0.61–1.24)
GFR, Estimated: >60 mL/min (ref >60)
Glucose, Bld: 90 mg/dL (ref 70–99)
Potassium: 3.5 mmol/L (ref 3.5–5.1)
Sodium: 134 mmol/L — ABNORMAL LOW (ref 135–145)
Total Bilirubin: 1.2 mg/dL (ref 0.3–1.2)
Total Protein: 7.5 g/dL (ref 6.5–8.1)

## 2021-04-05 LAB — SALICYLATE LEVEL: Salicylate Lvl: <7 mg/dL — ABNORMAL LOW (ref 7.0–30.0)

## 2021-04-05 LAB — ETHANOL: Alcohol, Ethyl (B): <10 mg/dL (ref <10)

## 2021-04-05 LAB — ACETAMINOPHEN LEVEL: Acetaminophen (Tylenol), Serum: <10 ug/mL — ABNORMAL LOW (ref 10–30)

## 2021-04-05 MED ORDER — ZIPRASIDONE MESYLATE 20 MG IM SOLR
20.0000 mg | Freq: Once | INTRAMUSCULAR | Status: AC
  Administered 2021-04-05: 20 mg via INTRAMUSCULAR
  Filled 2021-04-05: qty 20

## 2021-04-05 NOTE — ED Notes (Signed)
INVOLUNTARY PENDING TTS/PSYCH CONSULT AS THEY TRIED TO DO CONSULT BUT PT NOT AROUSABLE

## 2021-04-05 NOTE — ED Notes (Signed)
Report received from Melina Copa. Patient care assumed. Will continue to monitor. Pt resting with eyes closed, no distress noted at this time.

## 2021-04-05 NOTE — BH Assessment (Signed)
Writer attempted to assess patient, patient is awake with blanket over his head, when writer attempted to call patient's name patient would not remove the blanket and appears to be responding to internal stimuli with slurred speech. Writer will attempt at a later time.

## 2021-04-05 NOTE — ED Notes (Signed)
Pt. Is currently sleeping, will attempted to collect vitals, when pt. wakes up.

## 2021-04-05 NOTE — ED Provider Notes (Signed)
[facility] Emergency Department Provider Note  Time seen: 3:02 PM  I have reviewed the triage vital signs and the nursing notes.   HISTORY  Chief Complaint Manic Behavior   HPI Ronald [name] is a 25 y.o. male with a past medical history of asthma, ADHD, substance abuse, presents to the emergency department for acute psychosis.  According to mom they visited the patient today and found him to be acutely psychotic with rapid speech, nonsensical speech, seems extremely hyperactive.  During my evaluation patient is more the same, will answer questions with nonsensical speech, extremely active and hyperactive, will jump up and dance at times sit back down.  Patient is talking about different celebrities and historical figures.  Mom states patient has done this once previously was substance use but the patient denies any substance use today.   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  Prior to Admission medications   Medication Sig Start Date End Date Taking? Authorizing Provider  benztropine (COGENTIN) 0.5 MG tablet Take 1 tablet (0.5 mg total) by mouth at bedtime. Patient not taking: Reported on 01/29/2020 05/08/19   Clapacs, Jackquline Denmark, MD  risperiDONE (RISPERDAL M-TABS) 1 MG disintegrating tablet Take 1 tablet (1 mg total) by mouth 2 (two) times daily. 10/25/20   Clapacs, Jackquline Denmark, MD    No Known Allergies  No family history on file.  Social History Social History   Tobacco Use   Smoking status: Heavy Smoker    Packs/day: 0.50    Types: Cigarettes   Smokeless tobacco: Never  Substance Use Topics   Alcohol use: Yes    Comment: occasionally   Drug use: Not Currently    Types:  Marijuana    Review of Systems Unable to obtain adequate/accurate review of systems secondary to acute psychosis/manic behaviors ____________________________________________   PHYSICAL EXAM:  VITAL SIGNS: ED Triage Vitals  Enc Vitals Group     BP --      Pulse --      Resp --      Temp --      Temp src --      SpO2 --      Weight 04/05/21 1416 165 lb (74.8 kg)     Height 04/05/21 1416 5\' 8"  (1.727 m)     Head Circumference --      Peak Flow --      Pain Score 04/05/21 1415 0     Pain Loc --      Pain Edu? --      Excl. in GC? --     Constitutional: Awake alert, rapid speech, hyperactive, will occasionally answer questions but for the most part answers with nonsensical responses, often times dancing while answering. Eyes: Normal exam ENT      Head: Normocephalic and atraumatic.      Mouth/Throat: Mucous membranes are moist. Cardiovascular: Normal rate, regular rhythm around 100 bpm.  Respiratory: Normal respiratory effort without tachypnea nor retractions. Breath sounds are clear  Gastrointestinal: Soft and nontender. No distention.  Musculoskeletal: Nontender with normal range of motion in all extremities. Neurologic: Moves all extremities.  No gross deficits. Skin:  Skin is warm, dry and intact.  Psychiatric: Extremely hyperactive, rapid speech, nonsensical speech.   INITIAL IMPRESSION / ASSESSMENT AND PLAN / ED COURSE  Pertinent labs & imaging results that were available during my care of the patient were reviewed by me and considered in my medical decision making (see chart for details).   Patient presents to the emergency department with symptoms of mania as well as psychosis.  Per record review patient in 2020 had substance-induced psychosis.  Patient is not safe to care for himself in his current state I have placed the patient under an IVC.  Patient is extremely hyperactive jumping out of bed, not allowing proper evaluation we will dose 20 mg of IM Geodon.  Will  place patient under IVC and have psychiatry evaluate.  Highly suspect schizophrenia/psychotic break versus substance-induced psychosis.  Ronald [name] was evaluated in Emergency Department on 04/05/2021 for the symptoms described in the history of present illness. He was evaluated in the context of the global COVID-19 pandemic, which necessitated consideration that the patient might be at risk for infection with the SARS-CoV-2 virus that causes COVID-19. Institutional protocols and algorithms that pertain to the evaluation of patients at risk for COVID-19 are in a state of rapid change based on information released by regulatory bodies including the CDC and federal and state organizations. These policies and algorithms were followed during the patient's care in the ED.  ____________________________________________   FINAL CLINICAL IMPRESSION(S) / ED DIAGNOSES  Psychosis   Minna Antis, MD 04/05/21 1505

## 2021-04-05 NOTE — ED Notes (Signed)
No change in condition, pt resting with eyes closed.  

## 2021-04-05 NOTE — ED Notes (Signed)
Medication able to be given by staff with officers on standby. No manual hold needed. Pt laid down and allowed medication to be given.

## 2021-04-05 NOTE — ED Notes (Signed)
Pt belongings include one pair red shorts, one tshirt, two shoes, one magazine. 1/1 belongings bag.

## 2021-04-05 NOTE — ED Notes (Signed)
Unable to dress out, obtain VS, or blood work d/t behavior at this time

## 2021-04-05 NOTE — BH Assessment (Signed)
Psych Team attempted to interview patient. Patient could not be aroused. Will try at a later time.

## 2021-04-05 NOTE — ED Notes (Signed)
Pt very manic, attempting to be calm and cooperative but pt movements preventing him from doing so. Pt Talking in circles not making sense. Unable to get vitals or blood or get pt dressed out at this time. Officers at bedside. Medication ordered.

## 2021-04-05 NOTE — ED Triage Notes (Signed)
Pt to ED with mother and aunt for manic behavior. Pt witnessed walking along road today, family believes possibly drug related.  Pt with pressured speech. Speaking in incoherent sentences. Family states pt has not been home in 3 days, states this is not normal for him but has started doing this more.   Unable to follow directions.   Difficulty obtaining VS, pt unable to sit still, not following commands, unable to have a conversation.

## 2021-04-05 NOTE — ED Notes (Signed)
IVC PENDING  CONSULT ?

## 2021-04-05 NOTE — ED Notes (Signed)
Mom visiting at bedside

## 2021-04-06 DIAGNOSIS — F23 Brief psychotic disorder: Secondary | ICD-10-CM | POA: Diagnosis not present

## 2021-04-06 DIAGNOSIS — F15959 Other stimulant use, unspecified with stimulant-induced psychotic disorder, unspecified: Secondary | ICD-10-CM | POA: Diagnosis not present

## 2021-04-06 LAB — URINE DRUG SCREEN, QUALITATIVE (ARMC ONLY)
Amphetamines, Ur Screen: POSITIVE — AB
Barbiturates, Ur Screen: NOT DETECTED
Benzodiazepine, Ur Scrn: NOT DETECTED
Cannabinoid 50 Ng, Ur ~~LOC~~: POSITIVE — AB
Cocaine Metabolite,Ur ~~LOC~~: POSITIVE — AB
MDMA (Ecstasy)Ur Screen: NOT DETECTED
Methadone Scn, Ur: NOT DETECTED
Opiate, Ur Screen: NOT DETECTED
Phencyclidine (PCP) Ur S: NOT DETECTED
Tricyclic, Ur Screen: NOT DETECTED

## 2021-04-06 LAB — RESP PANEL BY RT-PCR (FLU A&B, COVID) ARPGX2
Influenza A by PCR: POSITIVE — AB
Influenza B by PCR: NEGATIVE
SARS Coronavirus 2 by RT PCR: NEGATIVE

## 2021-04-06 MED ORDER — RISPERIDONE 0.5 MG PO TBDP
1.0000 mg | ORAL_TABLET | Freq: Two times a day (BID) | ORAL | Status: DC
  Administered 2021-04-06 – 2021-04-07 (×2): 1 mg via ORAL
  Filled 2021-04-06 (×2): qty 2

## 2021-04-06 MED ORDER — BENZTROPINE MESYLATE 1 MG PO TABS
0.5000 mg | ORAL_TABLET | Freq: Every day | ORAL | Status: DC
  Administered 2021-04-06: 0.5 mg via ORAL
  Filled 2021-04-06: qty 1

## 2021-04-06 NOTE — ED Provider Notes (Signed)
Emergency Medicine Observation Re-evaluation Note  Ronald [name] is a 24 y.o. male, seen on rounds today.  Pt initially presented to the ED for complaints of Manic Behavior Currently, the patient is resting, voices no medical complaints.  Physical Exam  BP 139/72 (BP Location: Left Arm)   Pulse (!) 116   Temp 97.9 F (36.6 C) (Oral)   Resp 18   Ht 5\' 8"  (1.727 m)   Wt 74.8 kg   SpO2 97%   BMI 25.09 kg/m  Physical Exam General: Resting in no acute distress Cardiac: No cyanosis Lungs: Equal rise and fall Psych: Not agitated  ED Course / MDM  EKG:   I have reviewed the labs performed to date as well as medications administered while in observation.  Recent changes in the last 24 hours include no events overnight.  Plan  Current plan is for psychiatric admission. Qusay Mclinden is under involuntary commitment.      Madilyn Fireman, MD 04/06/21 [phone]

## 2021-04-06 NOTE — ED Notes (Signed)
Pt awake, asking when he can leave. Pt very restless and asking repetitive questions. Pt oriented to place and situation. Pt given a food tray. Urine and covid swab to the lab.

## 2021-04-06 NOTE — ED Notes (Signed)
IVC  PENDING  PLACEMENT  CONSULT  DONE 

## 2021-04-06 NOTE — ED Notes (Signed)
Offered snack, declined. Denies further needs at this time.

## 2021-04-06 NOTE — ED Notes (Signed)
No change in condition, will continue to monitor.  

## 2021-04-06 NOTE — Consult Note (Signed)
[facility] Face-to-Face Psychiatry Consult   Reason for Consult: Acute psychosis  Referring Physician: EDP Patient Identification: Ronald [name] MRN:  [id number] Principal Diagnosis: Acute psychosis (HCC) Diagnosis:  Principal Problem:   Acute psychosis (HCC)   Total Time spent with patient: 1 hour  Subjective: "I have uncontrollable full of energy." Ronald [name] is a 25 y.o. male patient admitted with acute psychosis.  HPI: Patient seen and chart reviewed.  Patient was asleep when approached, was able to wake up.  He is fidgety, and almost constant movement moving from side to side.  When asked questions, he will repeat the questions back.  His thought blocking.  He is able to tell me that he lives with his mom and 3 brothers.  He is unemployed.  When asked what he does with this time he does say that I contemplated on what to do with my life."  He says his pleasure in life is drugs.  Patient will sometimes laugh inappropriately.  When asked if he has suicidal thoughts he stares and refuses to answer.  He denies auditory or visual hallucinations, however it appears that he may be responding to internal stimuli.  Recommend inpatient psychiatric hospitalization for diagnosis and treatment.  Past Psychiatric History: Prior emergency room visits for unspecified psychosis under the influence of substances.  Admission in December 2020 at [facility] for psychosis.   Risk to Self:   Risk to Others:   Prior Inpatient Therapy:   Prior Outpatient Therapy:    Past Medical History:  Past Medical History:  Diagnosis Date   Asthma    No past surgical history on file. Family History: No family history on file. Family Psychiatric  History: Per chart review, history of alcoholism in the family Social History:  Social History   Substance and Sexual Activity  Alcohol Use Yes   Comment: occasionally     Social History   Substance and Sexual Activity  Drug Use Not Currently   Types: Marijuana    Social  History   Socioeconomic History   Marital status: Single    Spouse name: Not on file   Number of children: Not on file   Years of education: Not on file   Highest education level: Not on file  Occupational History   Not on file  Tobacco Use   Smoking status: Heavy Smoker    Packs/day: 0.50    Types: Cigarettes   Smokeless tobacco: Never  Substance and Sexual Activity   Alcohol use: Yes    Comment: occasionally   Drug use: Not Currently    Types: Marijuana   Sexual activity: Not Currently    Partners: Female  Other Topics Concern   Not on file  Social History Narrative   Not on file   Social Determinants of Health   Financial Resource Strain: Not on file  Food Insecurity: Not on file  Transportation Needs: Not on file  Physical Activity: Not on file  Stress: Not on file  Social Connections: Not on file   Additional Social History:    Allergies:  No Known Allergies  Labs:  Results for orders placed or performed during the hospital encounter of 04/05/21 (from the past 48 hour(s))  Resp Panel by RT-PCR (Flu A&B, Covid) Nasopharyngeal Swab     Status: Abnormal   Collection Time: 04/05/21  1:05 AM   Specimen: Nasopharyngeal Swab; Nasopharyngeal(NP) swabs in vial transport medium  Result Value Ref Range   SARS Coronavirus 2 by RT PCR NEGATIVE NEGATIVE  Comment: (NOTE) SARS-CoV-2 target nucleic acids are NOT DETECTED.  The SARS-CoV-2 RNA is generally detectable in upper respiratory specimens during the acute phase of infection. The lowest concentration of SARS-CoV-2 viral copies this assay can detect is 138 copies/mL. A negative result does not preclude SARS-Cov-2 infection and should not be used as the sole basis for treatment or other patient management decisions. A negative result may occur with  improper specimen collection/handling, submission of specimen other than nasopharyngeal swab, presence of viral mutation(s) within the areas targeted by this assay,  and inadequate number of viral copies(<138 copies/mL). A negative result must be combined with clinical observations, patient history, and epidemiological information. The expected result is Negative.  Fact Sheet for Patients:  BloggerCourse.com  Fact Sheet for Healthcare Providers:  SeriousBroker.it  This test is no t yet approved or cleared by the [place] FDA and  has been authorized for detection and/or diagnosis of SARS-CoV-2 by FDA under an Emergency Use Authorization (EUA). This EUA will remain  in effect (meaning this test can be used) for the duration of the COVID-19 declaration under Section 564(b)(1) of the Act, 21 U.S.C.section 360bbb-3(b)(1), unless the authorization is terminated  or revoked sooner.       Influenza A by PCR POSITIVE (A) NEGATIVE   Influenza B by PCR NEGATIVE NEGATIVE    Comment: (NOTE) The Xpert Xpress SARS-CoV-2/FLU/RSV plus assay is intended as an aid in the diagnosis of influenza from Nasopharyngeal swab specimens and should not be used as a sole basis for treatment. Nasal washings and aspirates are unacceptable for Xpert Xpress SARS-CoV-2/FLU/RSV testing.  Fact Sheet for Patients: BloggerCourse.com  Fact Sheet for Healthcare Providers: SeriousBroker.it  This test is not yet approved or cleared by the [place] FDA and has been authorized for detection and/or diagnosis of SARS-CoV-2 by FDA under an Emergency Use Authorization (EUA). This EUA will remain in effect (meaning this test can be used) for the duration of the COVID-19 declaration under Section 564(b)(1) of the Act, 21 U.S.C. section 360bbb-3(b)(1), unless the authorization is terminated or revoked.  Performed at [facility], [address] [place], [place] [address]   Comprehensive metabolic panel     Status: Abnormal   Collection Time: 04/05/21  4:31 PM   Result Value Ref Range   Sodium 134 (L) 135 - 145 mmol/L   Potassium 3.5 3.5 - 5.1 mmol/L   Chloride 102 98 - 111 mmol/L   CO2 24 22 - 32 mmol/L   Glucose, Bld 90 70 - 99 mg/dL    Comment: Glucose reference range applies only to samples taken after fasting for at least 8 hours.   BUN 15 6 - 20 mg/dL   Creatinine, Ser 4.00 0.61 - 1.24 mg/dL   Calcium 9.2 8.9 - 86.7 mg/dL   Total Protein 7.5 6.5 - 8.1 g/dL   Albumin 3.8 3.5 - 5.0 g/dL   AST 28 15 - 41 U/L   ALT 16 0 - 44 U/L   Alkaline Phosphatase 50 38 - 126 U/L   Total Bilirubin 1.2 0.3 - 1.2 mg/dL   GFR, Estimated >61 >95 mL/min    Comment: (NOTE) Calculated using the CKD-EPI Creatinine Equation (2021)    Anion gap 8 5 - 15    Comment: Performed at [facility], [address], [place], [place] [address]  Ethanol     Status: None   Collection Time: 04/05/21  4:31 PM  Result Value Ref Range   Alcohol, Ethyl (B) <  10 <10 mg/dL    Comment: (NOTE) Lowest detectable limit for serum alcohol is 10 mg/dL.  For medical purposes only. Performed at [facility], [address] [place], [place] [address]   Salicylate level     Status: Abnormal   Collection Time: 04/05/21  4:31 PM  Result Value Ref Range   Salicylate Lvl <7.0 (L) 7.0 - 30.0 mg/dL    Comment: Performed at [facility], [address] [place], [place] [address]  Acetaminophen level     Status: Abnormal   Collection Time: 04/05/21  4:31 PM  Result Value Ref Range   Acetaminophen (Tylenol), Serum <10 (L) 10 - 30 ug/mL    Comment: (NOTE) Therapeutic concentrations vary significantly. A range of 10-30 ug/mL  may be an effective concentration for many patients. However, some  are best treated at concentrations outside of this range. Acetaminophen concentrations >150 ug/mL at 4 hours after ingestion  and >50 ug/mL at 12 hours after ingestion are often associated with  toxic reactions.  Performed at [facility], [address] [place], [place] [address]   cbc     Status: None   Collection Time: 04/05/21  4:31 PM  Result Value Ref Range   WBC 10.2 4.0 - 10.5 K/uL   RBC 4.67 4.22 - 5.81 MIL/uL   Hemoglobin 14.1 13.0 - 17.0 g/dL   HCT 21.1 94.1 - 74.0 %   MCV 90.4 80.0 - 100.0 fL   MCH 30.2 26.0 - 34.0 pg   MCHC 33.4 30.0 - 36.0 g/dL   RDW 81.4 48.1 - 85.6 %   Platelets 307 150 - 400 K/uL   nRBC 0.0 0.0 - 0.2 %    Comment: Performed at [facility], [address] [place], [place] [address]  Urine Drug Screen, Qualitative     Status: Abnormal   Collection Time: 04/06/21  1:30 AM  Result Value Ref Range   Tricyclic, Ur Screen NONE DETECTED NONE DETECTED   Amphetamines, Ur Screen POSITIVE (A) NONE DETECTED   MDMA (Ecstasy)Ur Screen NONE DETECTED NONE DETECTED   Cocaine Metabolite,Ur [place] POSITIVE (A) NONE DETECTED   Opiate, Ur Screen NONE DETECTED NONE DETECTED   Phencyclidine (PCP) Ur S NONE DETECTED NONE DETECTED   Cannabinoid 50 Ng, Ur [place] POSITIVE (A) NONE DETECTED   Barbiturates, Ur Screen NONE DETECTED NONE DETECTED   Benzodiazepine, Ur Scrn NONE DETECTED NONE DETECTED   Methadone Scn, Ur NONE DETECTED NONE DETECTED    Comment: (NOTE) Tricyclics + metabolites, urine    Cutoff 1000 ng/mL Amphetamines + metabolites, urine  Cutoff 1000 ng/mL MDMA (Ecstasy), urine              Cutoff 500 ng/mL Cocaine Metabolite, urine          Cutoff 300 ng/mL Opiate + metabolites, urine        Cutoff 300 ng/mL Phencyclidine (PCP), urine         Cutoff 25 ng/mL Cannabinoid, urine                 Cutoff 50 ng/mL Barbiturates + metabolites, urine  Cutoff 200 ng/mL Benzodiazepine, urine              Cutoff 200 ng/mL Methadone, urine                   Cutoff 300 ng/mL  The urine drug screen provides only a preliminary, unconfirmed analytical test result and should not be used for non-medical  purposes. Clinical consideration and professional judgment should be applied to any positive drug screen result due  to possible interfering substances. A more specific alternate chemical method must be used in order to obtain a confirmed analytical result. Gas chromatography / mass spectrometry (GC/MS) is the preferred confirm atory method. Performed at [facility], [address] [place], [place] [address]     No current facility-administered medications for this encounter.   Current Outpatient Medications  Medication Sig Dispense Refill   benztropine (COGENTIN) 0.5 MG tablet Take 1 tablet (0.5 mg total) by mouth at bedtime. (Patient not taking: Reported on 01/29/2020) 30 tablet 0   risperiDONE (RISPERDAL M-TABS) 1 MG disintegrating tablet Take 1 tablet (1 mg total) by mouth 2 (two) times daily. (Patient not taking: Reported on 04/05/2021) 60 tablet 1    Musculoskeletal: Strength & Muscle Tone: within normal limits Gait & Station: normal Patient leans: N/A    Psychiatric Specialty Exam:  Presentation  General Appearance: Appropriate for Environment Eye Contact:Fleeting Speech:Blocked Speech Volume:No data recorded Handedness:No data recorded  Mood and Affect  Mood:Depressed; Labile Affect:Blunt  Thought Process  Thought Processes:Disorganized Descriptions of Associations:Circumstantial Orientation:Partial Thought Content:Scattered History of Schizophrenia/Schizoaffective disorder:No  Duration of Psychotic Symptoms:Greater than six months  Hallucinations:Hallucinations: Other (comment) (denies, may be rtis) Ideas of Reference:No data recorded Suicidal Thoughts:No data recorded Homicidal Thoughts:No data recorded  Sensorium  Memory:No data recorded Judgment:Poor Insight:Poor  Executive Functions  Concentration:Poor Attention Span:Poor Recall:Poor Fund of Knowledge:Fair Language:Poor  Psychomotor Activity  Psychomotor Activity:Psychomotor Activity: Increased  Assets  Assets:Financial Resources/Insurance; Housing; Resilience  Sleep  Sleep:Sleep:  Fair  Physical Exam: Physical Exam Vitals and nursing note reviewed.  Constitutional:      Comments: Distracted   HENT:     Head: Normocephalic.     Nose: No congestion or rhinorrhea.  Eyes:     General:        Right eye: No discharge.        Left eye: No discharge.  Pulmonary:     Effort: Pulmonary effort is normal.  Musculoskeletal:        General: Normal range of motion.     Cervical back: Normal range of motion.  Skin:    General: Skin is dry.  Neurological:     Mental Status: He is disoriented.  Psychiatric:        Attention and Perception: He is inattentive.        Mood and Affect: Affect is flat.        Speech: Speech is delayed.        Behavior: Behavior is slowed.        Judgment: Judgment is impulsive.   Review of Systems  Psychiatric/Behavioral:  Positive for depression and substance abuse.   Blood pressure 139/72, pulse (!) 116, temperature 97.9 F (36.6 C), temperature source Oral, resp. rate 18, height 5\' 8"  (1.727 m), weight 74.8 kg, SpO2 97 %. Body mass index is 25.09 kg/m.  Treatment Plan Summary: Daily contact with patient to assess and evaluate symptoms and progress in treatment, Medication management, and Plan : 25 year old male  presenting by IVC after not being home for 3 days and found wandering in the streets, incoherent.  Patient UDS was positive for amphetamines, cocaine, and THC.  Patient is slightly more alert today than he was yesterday, but has thoughts blocking and disorganized speech.  Recommend psychiatric inpatient admission.  Discussed with EDP.  Disposition: Recommend psychiatric Inpatient admission when medically cleared.  30, NP  04/06/2021 9:07 AM

## 2021-04-06 NOTE — ED Notes (Signed)
[facility] informed RN there will have to be a 5 day quarantine period since pt tested + for influenza A

## 2021-04-06 NOTE — ED Notes (Signed)
Pt still restless and pacing around the room.

## 2021-04-06 NOTE — ED Notes (Signed)
Pt breakfast tray sat at bedside  

## 2021-04-06 NOTE — ED Notes (Signed)
IVC/Consult Pending  

## 2021-04-06 NOTE — BH Assessment (Addendum)
Comprehensive Clinical Assessment (CCA) Note  04/06/2021 Ronald [name] [id number]  Chief Complaint: Patient is a 25 year old male presenting to [facility] ED under IVC. Per triage note Pt to ED with mother and aunt for manic behavior. Pt witnessed walking along road today, family believes possibly drug related. Pt with pressured speech. Speaking in incoherent sentences. Family states pt has not been home in 3 days, states this is not normal for him but has started doing this more. Unable to follow directions. During assessment patient appears alert and oriented x2, patient is unaware of the situation and why he is here. Patient can be observed sitting on his bed talking to himself and laughing, patient's movement are restless. Patient denies using any substances and he reports that he lives with his "warden", patient then laughs and reports "I'm just kidding it's just my mom." Patient UDS is positive for Amphetamines, Cocaine, and Cannabinoids. Patient denies current SI/HI/AH/VH.  Collateral information was obtained from patient's mother Ronald [name] [phone] who reports "it's been the same behavior as before but now he's been staying gone from home 2 or 3 days." "I can tell there has been some indulging, he would never go to [facility]." "Not working, he will not follow-through with working." "If he's at home he's pretty stable, kinda like mad at world and a little hype but he's a decent young man."   Per Psyc NP Ronald [name] patient is recommended for Inpatient treatment Chief Complaint  Patient presents with   Manic Behavior   Visit Diagnosis: Substance-induced psychosis    CCA Screening, Triage and Referral (STR)  Patient Reported Information How did you hear about [place]? Legal System  Referral name: Police Dept  Referral phone number: [phone]   Whom do you see for routine medical problems? Hospital ER  Practice/Facility Name: No data recorded Practice/Facility Phone Number: No data  recorded Name of Contact: No data recorded Contact Number: No data recorded Contact Fax Number: No data recorded Prescriber Name: No data recorded Prescriber Address (if known): No data recorded  What Is the Reason for Your Visit/Call Today? Patient presents under IVC due to bizarre behavior  How Long Has This Been Causing You Problems? > than 6 months  What Do You Feel Would Help You the Most Today? Alcohol or Drug Use Treatment   Have You Recently Been in Any Inpatient Treatment (Hospital/Detox/Crisis Center/28-Day Program)? No  Name/Location of Program/Hospital:No data recorded How Long Were You There? No data recorded When Were You Discharged? No data recorded  Have You Ever Received Services From [facility] Before? Yes  Who Do You See at [facility]? ER   Have You Recently Had Any Thoughts About Hurting Yourself? No  Are You Planning to Commit Suicide/Harm Yourself At This time? No   Have you Recently Had Thoughts About Hurting Someone Ronald [name]? No  Explanation: No data recorded  Have You Used Any Alcohol or Drugs in the Past 24 Hours? No  How Long Ago Did You Use Drugs or Alcohol? No data recorded What Did You Use and How Much? Substance Unknown - UDS not completed   Do You Currently Have a Therapist/Psychiatrist? No  Name of Therapist/Psychiatrist: No data recorded  Have You Been Recently Discharged From Any Office Practice or Programs? No  Explanation of Discharge From Practice/Program: No data recorded    CCA Screening Triage Referral Assessment Type of Contact: Face-to-Face  Is this Initial or Reassessment? No data recorded Date Telepsych consult ordered in [facility]:  No data  recorded Time Telepsych consult ordered in [facility]:  No data recorded  Patient Reported Information Reviewed? Yes  Patient Left Without Being Seen? No data recorded Reason for Not Completing Assessment: No data recorded  Collateral Involvement: No data recorded  Does Patient Have a  Court Appointed Legal Guardian? No data recorded Name and Contact of Legal Guardian: No data recorded If Minor and Not Living with Parent(s), Who has Custody? No data recorded Is CPS involved or ever been involved? Never  Is APS involved or ever been involved? Never   Patient Determined To Be At Risk for Harm To Self or Others Based on Review of Patient Reported Information or Presenting Complaint? No  Method: No data recorded Availability of Means: No data recorded Intent: No data recorded Notification Required: No data recorded Additional Information for Danger to Others Potential: No data recorded Additional Comments for Danger to Others Potential: No data recorded Are There Guns or Other Weapons in Your Home? No data recorded Types of Guns/Weapons: No data recorded Are These Weapons Safely Secured?                            No data recorded Who Could Verify You Are Able To Have These Secured: No data recorded Do You Have any Outstanding Charges, Pending Court Dates, Parole/Probation? No data recorded Contacted To Inform of Risk of Harm To Self or Others: No data recorded  Location of Assessment: [facility] ED   Does Patient Present under Involuntary Commitment? Yes  IVC Papers Initial File Date: 04/06/21   [place] of Residence: [place]   Patient Currently Receiving the Following Services: Not Receiving Services   Determination of Need: Emergent (2 hours)   Options For Referral: Outpatient Therapy     CCA Biopsychosocial Intake/Chief Complaint:  No data recorded Current Symptoms/Problems: No data recorded  Patient Reported Schizophrenia/Schizoaffective Diagnosis in Past: No   Strengths: Patient is able to communicate  Preferences: No data recorded Abilities: No data recorded  Type of Services Patient Feels are Needed: No data recorded  Initial Clinical Notes/Concerns: No data recorded  Mental Health Symptoms Depression:   None   Duration of Depressive  symptoms: No data recorded  Mania:   Change in energy/activity; Euphoria; Increased Energy; Racing thoughts; Recklessness   Anxiety:    Difficulty concentrating; Restlessness   Psychosis:   Grossly disorganized speech; Hallucinations   Duration of Psychotic symptoms:  Greater than six months   Trauma:   None   Obsessions:   None   Compulsions:   None   Inattention:   Disorganized   Hyperactivity/Impulsivity:   Fidgets with hands/feet   Oppositional/Defiant Behaviors:   None   Emotional Irregularity:   None   Other Mood/Personality Symptoms:  No data recorded   Mental Status Exam Appearance and self-care  Stature:   Small   Weight:   Thin   Clothing:   Disheveled   Grooming:   Neglected   Cosmetic use:   None   Posture/gait:   Slumped   Motor activity:   Repetitive; Restless   Sensorium  Attention:   Distractible; Inattentive   Concentration:   Focuses on irrelevancies   Orientation:   Person; Place   Recall/memory:   Defective in Recent   Affect and Mood  Affect:   Anxious; Labile   Mood:   Anxious; Euphoric   Relating  Eye contact:   Fleeting   Facial expression:   Anxious   Attitude  toward examiner:   Cooperative; Sarcastic   Thought and Language  Speech flow:  Slurred   Thought content:   Delusions   Preoccupation:   Ruminations   Hallucinations:   Auditory   Organization:  No data recorded  [employer] of Knowledge:   Fair   Intelligence:   Average   Abstraction:   Functional   Judgement:   Impaired   Reality Testing:   Distorted   Insight:   Lacking; None/zero insight; Poor   Decision Making:   Impulsive   Social Functioning  Social Maturity:   Isolates   Social Judgement:   "Chief of Staff"   Stress  Stressors:   Family conflict; Housing; Office manager Ability:   Contractor Deficits:   None   Supports:   Family      Religion: Religion/Spirituality Are You A Religious Person?: No  Leisure/Recreation: Leisure / Recreation Do You Have Hobbies?: No  Exercise/Diet: Exercise/Diet Do You Exercise?: No Have You Gained or Lost A Significant Amount of Weight in the Past Six Months?: No Do You Follow a Special Diet?: No Do You Have Any Trouble Sleeping?: No   CCA Employment/Education Employment/Work Situation: Employment / Work Situation Employment Situation: Unemployed Has Patient ever Been in Equities trader?: No  Education: Education Is Patient Currently Attending School?: No Did You Have An Individualized Education Program (IIEP): No Did You Have Any Difficulty At [employer]?: No Patient's Education Has Been Impacted by Current Illness: No   CCA Family/Childhood History Family and Relationship History: Family history Marital status: Single Does patient have children?: No  Childhood History:  Childhood History By whom was/is the patient raised?: Mother Did patient suffer any verbal/emotional/physical/sexual abuse as a child?: No Did patient suffer from severe childhood neglect?: No Has patient ever been sexually abused/assaulted/raped as an adolescent or adult?: No Was the patient ever a victim of a crime or a disaster?: No Witnessed domestic violence?: No Has patient been affected by domestic violence as an adult?: No  Child/Adolescent Assessment:     CCA Substance Use Alcohol/Drug Use: Alcohol / Drug Use Pain Medications: See MAR Prescriptions: See MAR Over the Counter: See MAR History of alcohol / drug use?: Yes Substance #1 Name of Substance 1: Patient denies substance use but has a history of abuse                       ASAM's:  Six Dimensions of Multidimensional Assessment  Dimension 1:  Acute Intoxication and/or Withdrawal Potential:      Dimension 2:  Biomedical Conditions and Complications:      Dimension 3:  Emotional, Behavioral, or Cognitive  Conditions and Complications:     Dimension 4:  Readiness to Change:     Dimension 5:  Relapse, Continued use, or Continued Problem Potential:     Dimension 6:  Recovery/Living Environment:     ASAM Severity Score:    ASAM Recommended Level of Treatment:     Substance use Disorder (SUD)    Recommendations for Services/Supports/Treatments:    DSM5 Diagnoses: Patient Active Problem List   Diagnosis Date Noted   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

## 2021-04-06 NOTE — BH Assessment (Signed)
Referral information for Psychiatric Hospitalization faxed to;   [facility] [phone]- [phone]),   [facility] [phone]),  [place] [phone], [phone], [phone] or [phone]),   [facility] [phone] or [phone])  [place] [phone]),   [facility] [phone] -[phone]), declined due to having the flu.  [facility] [phone]).  [facility] [phone])

## 2021-04-06 NOTE — ED Notes (Signed)
Pt resting quietly with eyes closed

## 2021-04-07 DIAGNOSIS — F15959 Other stimulant use, unspecified with stimulant-induced psychotic disorder, unspecified: Secondary | ICD-10-CM | POA: Diagnosis not present

## 2021-04-07 NOTE — ED Notes (Signed)
Pt given back his belongings and the phone to call his ride.

## 2021-04-07 NOTE — ED Notes (Signed)
Patient received breakfast tray 

## 2021-04-07 NOTE — ED Provider Notes (Signed)
Emergency Medicine Observation Re-evaluation Note  Ronald [name] is a 25 y.o. male, seen on rounds today.  Pt initially presented to the ED for complaints of Manic Behavior Currently, the patient is resting, voices no medical complaints.  Physical Exam  BP 109/60 (BP Location: Right Arm)   Pulse 90   Temp 98.4 F (36.9 C) (Oral)   Resp 16   Ht 5\' 8"  (1.727 m)   Wt 74.8 kg   SpO2 98%   BMI 25.09 kg/m  Physical Exam General: Resting in no acute distress Cardiac: No cyanosis Lungs: Equal rise and fall Psych: Not agitated  ED Course / MDM  EKG:   I have reviewed the labs performed to date as well as medications administered while in observation.  Recent changes in the last 24 hours include no events overnight.  Plan  Current plan is for psychiatric disposition; to [facility] after quarantine for influenza A. [facility] Latulippe is under involuntary commitment.      Madilyn Fireman, MD 04/07/21 [phone]

## 2021-04-07 NOTE — Consult Note (Signed)
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  Today, he is clear and coherent with no threat to self or other.  No psychosis or paranoia.  Denies withdrawal symptoms.  He is interested in rehab and TTS provided resources for follow-up.  Denies depression, mild anxiety, no panic attacks.  Sleep without meth abuse is "fair", appetite is "fair", no recent weight loss.  He lives with his mother who is supportive.  He would like to discharge and follow up with resources, stable psychiatrically for discharge.  Total Time spent with patient: 45 minutes  Past Psychiatric History: substance abuse  Past Medical History:  Past Medical History:  Diagnosis Date   Asthma    No past surgical history on file. Family History: No family history on file. Family Psychiatric  History: none Social History:  Social History   Substance and Sexual Activity  Alcohol Use Yes   Comment: occasionally     Social History   Substance and Sexual Activity  Drug Use Not Currently   Types: Marijuana    Social History   Socioeconomic History   Marital status: Single    Spouse name: Not on file   Number of children: Not on file   Years of education: Not on file   Highest education level: Not on file  Occupational History   Not on file  Tobacco Use   Smoking status: Heavy Smoker    Packs/day: 0.50    Types: Cigarettes   Smokeless tobacco: Never  Substance and Sexual Activity   Alcohol use: Yes    Comment: occasionally   Drug use: Not Currently    Types: Marijuana   Sexual activity: Not Currently    Partners:  Female  Other Topics Concern   Not on file  Social History Narrative   Not on file   Social Determinants of Health   Financial Resource Strain: Not on file  Food Insecurity: Not on file  Transportation Needs: Not on file  Physical Activity: Not on file  Stress: Not on file  Social Connections: Not on file    Tobacco Cessation:  A prescription for an FDA-approved tobacco cessation medication was offered at discharge and the patient refused  Current Medications: Current Facility-Administered Medications  Medication Dose Route Frequency Provider Last Rate Last Admin   benztropine (COGENTIN) tablet 0.5 mg  0.5 mg Oral QHS Gabriel Cirri F, NP   0.5 mg at 04/06/21 2015   risperiDONE (RISPERDAL M-TABS) disintegrating tablet 1 mg  1 mg Oral BID Gabriel Cirri F, NP   1 mg at 04/07/21 1018   Current Outpatient Medications  Medication Sig Dispense Refill   benztropine (COGENTIN) 0.5 MG tablet Take 1 tablet (0.5 mg total) by mouth at bedtime. (Patient not taking: Reported on 01/29/2020) 30 tablet 0   risperiDONE (RISPERDAL M-TABS) 1 MG disintegrating tablet Take 1 tablet (1 mg total) by mouth 2 (two) times daily. (Patient not taking: Reported on 04/05/2021) 60 tablet 1   PTA Medications: (Not in a hospital admission)   Musculoskeletal: Strength & Muscle Tone: within normal limits Gait & Station: normal Patient leans: Right  Psychiatric Specialty Exam:  Physical Exam Vitals and nursing note reviewed.  Constitutional:      Appearance: Normal appearance.  HENT:     Head: Normocephalic.     Nose: Nose normal.  Pulmonary:     Effort: Pulmonary effort is normal.  Musculoskeletal:        General: Normal range of motion.     Cervical back: Normal range of motion.  Neurological:     General: No focal deficit present.     Mental Status: He is alert and oriented to person, place, and time.  Psychiatric:        Attention and Perception: Attention and perception normal.        Mood  and Affect: Mood is anxious.        Speech: Speech normal.        Behavior: Behavior normal. Behavior is cooperative.        Thought Content: Thought content normal.        Cognition and Memory: Cognition and memory normal.        Judgment: Judgment normal.    Review of Systems  Psychiatric/Behavioral:  Positive for substance abuse. The patient is nervous/anxious.   All other systems reviewed and are negative.  Blood pressure (!) 107/59, pulse 82, temperature 99 F (37.2 C), temperature source Oral, resp. rate 17, height 5\' 8"  (1.727 m), weight 74.8 kg, SpO2 98 %.Body mass index is 25.09 kg/m.  General Appearance: Disheveled  Eye Contact:  Fair  Speech:  Normal Rate  Volume:  Normal  Mood:  Anxious  Affect:  Congruent  Thought Process:  Coherent and Descriptions of Associations: Intact  Orientation:  Full (Time, Place, and Person)  Thought Content:  WDL and Logical  Suicidal Thoughts:  No  Homicidal Thoughts:  No  Memory:  Immediate;   Fair Recent;   Fair Remote;   Fair  Judgement:  Fair  Insight:  Fair  Psychomotor Activity:  Normal  Concentration:  Concentration: Fair and Attention Span: Fair  Recall:  of Knowledge:  Fair  Language:  Good  Akathisia:  No  Handed:  Right  AIMS (if indicated):     Assets:  Housing Leisure Time Physical Health Resilience Social Support  ADL's:  Intact  Cognition:  WNL  Sleep:      cla    Physical Exam: Physical Exam Vitals and nursing note reviewed.  Constitutional:      Appearance: Normal appearance.  HENT:     Head: Normocephalic.     Nose: Nose normal.  Pulmonary:     Effort: Pulmonary effort is normal.  Musculoskeletal:        General: Normal range of motion.     Cervical back: Normal range of motion.  Neurological:     General: No focal deficit present.     Mental Status: He is alert and oriented to person, place, and time.  Psychiatric:        Attention and Perception: Attention and perception normal.         Mood and Affect: Mood is anxious.        Speech: Speech normal.        Behavior: Behavior normal. Behavior is cooperative.        Thought Content: Thought content normal.        Cognition and Memory: Cognition and memory normal.        Judgment: Judgment normal.   Review of Systems  Psychiatric/Behavioral:  Positive for substance abuse. The patient is  nervous/anxious.   All other systems reviewed and are negative. Blood pressure (!) 107/59, pulse 82, temperature 99 F (37.2 C), temperature source Oral, resp. rate 17, height 5\' 8"  (1.727 m), weight 74.8 kg, SpO2 98 %. Body mass index is 25.09 kg/m.   Demographic Factors:  Male and Adolescent or young adult  Loss Factors: NA  Historical Factors: NA  Risk Reduction Factors:   Sense of responsibility to family, Living with another person, especially a relative, and Positive social support  Continued Clinical Symptoms:  Anxiety, mild  Cognitive Features That Contribute To Risk:  None    Suicide Risk:  Minimal: No identifiable suicidal ideation.  Patients presenting with no risk factors but with morbid ruminations; may be classified as minimal risk based on the severity of the depressive symptoms    Plan Of Care/Follow-up recommendations:  Stimulant use disorder: -Refrain from alcohol and drug use -Follow up with [facility] and 12-step programs Activity:  as tolerated Diet:  heart healthy diet   Disposition: discharge home , NP 04/07/2021, 12:48 PM

## 2021-08-09 ENCOUNTER — Other Ambulatory Visit: Payer: Self-pay

## 2021-08-09 ENCOUNTER — Encounter: Payer: Self-pay | Admitting: Nurse Practitioner

## 2021-08-09 ENCOUNTER — Ambulatory Visit: Payer: Medicaid Other | Admitting: Nurse Practitioner

## 2021-08-09 DIAGNOSIS — Z113 Encounter for screening for infections with a predominantly sexual mode of transmission: Secondary | ICD-10-CM

## 2021-08-09 DIAGNOSIS — N341 Nonspecific urethritis: Secondary | ICD-10-CM

## 2021-08-09 LAB — HEPATITIS B SURFACE ANTIGEN: Hepatitis B Surface Ag: NONREACTIVE

## 2021-08-09 LAB — HM HEPATITIS C SCREENING LAB: HM Hepatitis Screen: NEGATIVE

## 2021-08-09 LAB — HM HIV SCREENING LAB: HM HIV Screening: NEGATIVE

## 2021-08-09 LAB — GRAM STAIN

## 2021-08-09 MED ORDER — DOXYCYCLINE HYCLATE 100 MG PO TABS: 100.0000 mg | ORAL_TABLET | Freq: Two times a day (BID) | ORAL | 0 refills | Status: AC

## 2021-08-09 NOTE — Progress Notes (Signed)
Pt here for STD screening and as a contact to Chlamydia.  Gram stain results reviewed and medication dispensed per Provider orders.  Condoms given.  Windle Guard, RN ? ?

## 2021-08-10 NOTE — Progress Notes (Signed)
[facility] Department ?STI clinic/screening visit ? ?Subjective:  ?Ronald [name] is a 26 y.o. male being seen today for an STI screening visit. The patient reports they do not have symptoms.   ? ?Patient has the following medical conditions:   ?Patient Active Problem List  ? Diagnosis Date Noted  ? Stimulant-induced psychotic disorder (HCC) 10/23/2020  ? 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 Acute psychosis (HCC)   ? Cannabis abuse 08/12/2018  ? Alcohol abuse 08/12/2018  ? ? ? ?Chief Complaint  ?Patient presents with  ? SEXUALLY TRANSMITTED DISEASE  ?  Screening  ? ? ?HPI ? ?Patient reports to clinic today for an STD screening. Patient reports being a close contact to Chlamydia.  Patient currently asymptomatic.   ? ?Does the patient or their partner desires a pregnancy in the next year? No ? ?Screening for MPX risk: ?Does the patient have an unexplained rash? No ?Is the patient MSM? No ?Does the patient endorse multiple sex partners or anonymous sex partners? No ?Did the patient have close or sexual contact with a person diagnosed with MPX? No ?Has the patient traveled outside the [place] where MPX is endemic? No ?Is there a high clinical suspicion for MPX-- evidenced by one of the following No ? -Unlikely to be chickenpox ? -Lymphadenopathy ? -Rash that present in same phase of evolution on any given body part ? ? ?See flowsheet for further details and programmatic requirements.  ? ? ?The following portions of the patient's history were reviewed and updated as appropriate: allergies, current medications, past medical history, past social history, past surgical history and problem list. ? ?Objective:  ?There were no vitals filed for this visit. ? ?Physical Exam ?Constitutional:   ?   Appearance: Normal appearance.  ?HENT:  ?   Head: Normocephalic.  ?   Right Ear: External ear normal.  ?   Left Ear: External ear normal.  ?   Nose: Nose normal.  ?   Mouth/Throat:  ?   Mouth: Mucous membranes are moist.   ?   Comments: Signs of dental caries.  ?Pulmonary:  ?   Effort: Pulmonary effort is normal.  ?Abdominal:  ?   General: Abdomen is flat.  ?   Palpations: Abdomen is soft.  ?Genitourinary: ?   Penis: Circumcised.   ?   Comments: Pubic area without nits, lice, hair loss, edema, erythema, lesions and inguinal adenopathy. ?Penis without rash, lesions and discharge at meatus. ?Testicles descended bilaterally,nt, no masses or edema.  ?Musculoskeletal:  ?   Cervical back: Full passive range of motion without pain, normal range of motion and neck supple.  ?Skin: ?   General: Skin is warm and dry.  ?Neurological:  ?   Mental Status: He is alert and oriented to person, place, and time.  ?Psychiatric:     ?   Attention and Perception: Attention normal.     ?   Mood and Affect: Mood normal.     ?   Speech: Speech normal.     ?   Behavior: Behavior is cooperative.  ? ? ? ? ?Assessment and Plan:  ?Ronald [name] is a 26 y.o. male presenting to the [facility] Department for STI screening ? ?1. Screening examination for venereal disease ?-26 year old male in clinic today for STD screening. ?-Patient interested in discussing PrEP today.  Patient provided information and resources.  Referral sent to [facility].   ?-Patient does not have STI symptoms ?Patient accepted all  screenings including  oral and urine CT/GC and bloodwork for HIV/RPR.  ?Patient meets criteria for HepB screening? Yes. Ordered? Yes ?Patient meets criteria for HepC screening? Yes. Ordered? Yes ?Recommended condom use with all sex ?Discussed importance of condom use for STI prevent ? ?Treat gram stain per standing order ?Discussed time line for State Lab results and that patient will be called with positive results and encouraged patient to call if he had not heard in 2 weeks ?Recommended returning for continued or worsening symptoms.   ?- HIV/HCV [place] [facility] ?- Syphilis Serology, [place] [facility] ?- Chlamydia/Gonorrhea [place]  Lab ?- Gram stain ?- HBV Antigen/Antibody State Lab ? ?2. NGU (nongonococcal urethritis) ?-Gram stain reviewed. Patient positive for NGU.   ?- doxycycline (VIBRA-TABS) 100 MG tablet; Take 1 tablet (100 mg total) by mouth 2 (two) times daily for 7 days.  Dispense: 14 tablet; Refill: 0 ? ? ? ? ?Return if symptoms worsen or fail to improve. ? ?No future appointments. ? ?Glenna Fellows, FNP ? ?

## 2021-08-17 ENCOUNTER — Telehealth: Payer: Self-pay

## 2021-08-17 NOTE — Telephone Encounter (Signed)
Calling pt regarding positive chlamydia result from 08/09/21 urine specimen.  Pt treated for NGU with doxycyline at clinic visit on 08/09/21 (also informed [facility] that he was contact to CT).  Inform pt of result. ? ? ?Phone call to pt at [phone]. Left message that RN with [facility] is calling about TR. Please call Muhammadali Ries at [phone]. ? ?

## 2021-08-18 NOTE — Telephone Encounter (Signed)
Phone call to pt at [phone]. Left message that RN with [facility] is calling about TR. Please call Markee Remlinger at [phone]. ?  ?

## 2021-08-21 NOTE — Telephone Encounter (Signed)
Phone call to pt at [phone]. Left message that RN with [facility] is calling to go over test results, may call alternative number if listed in chart, if cannot get in touch with him by phone, will need to send a letter to his home address.  Please call Orlene Salmons at [phone]. ? ?Phone call to alternative number listed in pt chart, [phone]. Person that answered phone gave alternative number for pt, [phone]. ? ?Phone call to [phone]. Pt answered phone and confirmed password from last visit. Pt counseled about TR and had already received tx. Pt to RTC in  3 months for TOC. ?

## 2021-10-01 ENCOUNTER — Emergency Department
Admission: EM | Admit: 2021-10-01 | Discharge: 2021-10-02 | Disposition: A | Discharge status: Home / Self Care | Payer: No Typology Code available for payment source | Attending: Emergency Medicine | Admitting: Emergency Medicine

## 2021-10-01 DIAGNOSIS — F23 Brief psychotic disorder: Secondary | ICD-10-CM

## 2021-10-01 DIAGNOSIS — F15959 Other stimulant use, unspecified with stimulant-induced psychotic disorder, unspecified: Secondary | ICD-10-CM | POA: Diagnosis present

## 2021-10-01 DIAGNOSIS — F29 Unspecified psychosis not due to a substance or known physiological condition: Secondary | ICD-10-CM

## 2021-10-01 DIAGNOSIS — Z20822 Contact with and (suspected) exposure to covid-19: Secondary | ICD-10-CM | POA: Diagnosis not present

## 2021-10-01 DIAGNOSIS — Z046 Encounter for general psychiatric examination, requested by authority: Secondary | ICD-10-CM | POA: Diagnosis present

## 2021-10-01 LAB — URINE DRUG SCREEN, QUALITATIVE (ARMC ONLY)
Amphetamines, Ur Screen: POSITIVE — AB
Barbiturates, Ur Screen: NOT DETECTED
Benzodiazepine, Ur Scrn: NOT DETECTED
Cannabinoid 50 Ng, Ur ~~LOC~~: POSITIVE — AB
Cocaine Metabolite,Ur ~~LOC~~: NOT DETECTED
MDMA (Ecstasy)Ur Screen: NOT DETECTED
Methadone Scn, Ur: NOT DETECTED
Opiate, Ur Screen: NOT DETECTED
Phencyclidine (PCP) Ur S: NOT DETECTED
Tricyclic, Ur Screen: NOT DETECTED

## 2021-10-01 LAB — ACETAMINOPHEN LEVEL: Acetaminophen (Tylenol), Serum: <10 ug/mL — ABNORMAL LOW (ref 10–30)

## 2021-10-01 LAB — CBC
HCT: 46.5 % (ref 39.0–52.0)
Hemoglobin: 15 g/dL (ref 13.0–17.0)
MCH: 29.8 pg (ref 26.0–34.0)
MCHC: 32.3 g/dL (ref 30.0–36.0)
MCV: 92.4 fL (ref 80.0–100.0)
Platelets: 370 10*3/uL (ref 150–400)
RBC: 5.03 MIL/uL (ref 4.22–5.81)
RDW: 12.6 % (ref 11.5–15.5)
WBC: 8.3 10*3/uL (ref 4.0–10.5)
nRBC: 0 % (ref 0.0–0.2)

## 2021-10-01 LAB — SALICYLATE LEVEL: Salicylate Lvl: <7 mg/dL — ABNORMAL LOW (ref 7.0–30.0)

## 2021-10-01 LAB — ETHANOL: Alcohol, Ethyl (B): <10 mg/dL (ref <10)

## 2021-10-01 MED ORDER — LORAZEPAM 2 MG PO TABS: 2.0000 mg | ORAL_TABLET | Freq: Once | ORAL | Status: DC

## 2021-10-01 MED ORDER — ZIPRASIDONE MESYLATE 20 MG IM SOLR
20.0000 mg | Freq: Once | INTRAMUSCULAR | Status: AC
  Administered 2021-10-01: 20 mg via INTRAMUSCULAR
  Filled 2021-10-01: qty 20

## 2021-10-01 MED ORDER — OLANZAPINE 10 MG IM SOLR
INTRAMUSCULAR | Status: AC
  Filled 2021-10-01: qty 20

## 2021-10-01 NOTE — ED Notes (Signed)
Dr. Josslin Sanjuan at bedside to speak with pt.

## 2021-10-01 NOTE — ED Notes (Signed)
Unable to obtain vitals due to patient sleeping. Will continue to monitor.   

## 2021-10-01 NOTE — BH Assessment (Signed)
TTS accompanied Psych for the assessment and consult, however patient was pacing and active psychosis  ? ?TTS ?Letti Towell ?

## 2021-10-01 NOTE — ED Provider Notes (Signed)
? ?[facility] ?Provider Note ? ? ? Event Date/Time  ? First MD Initiated Contact with Patient 10/01/21 1028   ?  (approximate) ? ? ?History  ? ?Psychiatric Evaluation ? ? ?HPI ? ?Ronald [name] is a 26 y.o. male with a self-reported history of ADHD and no other psychiatric or medical history who presents accompanied by police after IVC paperwork filled out and patient was found in someone's yard petting her dog.  When approached patient reportedly was crying attempting to hide his face.  He reportedly told nursing triage that he was "sniffing for food".  On my assessment he states he was trying to wake up somebody by knocking on her door because he needed to sleep.  He cannot further elaborate and is very tangential.  He denies any SI or HI.  He does endorse recent alcohol use although is not exactly sure when he last drank or how much.  He denies any other illicit drug use.  Denies any hallucinations.  States he does have some allergies and congestion and a little itchiness in his eyes but no other acute physical complaints including chest pain, cough, fevers, vomiting diarrhea rash or any recent injuries or falls.  He denies any other acute symptoms at this time ?  ? ? ?Physical Exam  ?Triage Vital Signs: ?ED Triage Vitals  ?Enc Vitals Group  ?   BP 10/01/21 1024 (!) 140/91  ?   Pulse Rate 10/01/21 1024 (!) 57  ?   Resp 10/01/21 1024 18  ?   Temp 10/01/21 1024 98.5 ?F (36.9 ?C)  ?   Temp Source 10/01/21 1024 Oral  ?   SpO2 10/01/21 1024 98 %  ?   Weight --   ?   Height --   ?   Head Circumference --   ?   Peak Flow --   ?   Pain Score 10/01/21 1023 0  ?   Pain Loc --   ?   Pain Edu? --   ?   Excl. in GC? --   ? ? ?Most recent vital signs: ?Vitals:  ? 10/01/21 1024  ?BP: (!) 140/91  ?Pulse: (!) 57  ?Resp: 18  ?Temp: 98.5 ?F (36.9 ?C)  ?SpO2: 98%  ? ? ?General: Awake ?CV:  Good peripheral perfusion.  2+ radial pulse ?Resp:  Normal effort.  ?Abd:  No distention.  ?Other:  Patient is fairly  tangential.  He is denying any SI or HI. ? ? ?ED Results / Procedures / Treatments  ?Labs ?(all labs ordered are listed, but only abnormal results are displayed) ?Labs Reviewed  ?COMPREHENSIVE METABOLIC PANEL - Abnormal; Notable for the following components:  ?    Result Value  ? Total Protein 8.3 (*)   ? GFR, Estimated 57 (*)   ? All other components within normal limits  ?SALICYLATE LEVEL - Abnormal; Notable for the following components:  ? Salicylate Lvl <7.0 (*)   ? All other components within normal limits  ?ACETAMINOPHEN LEVEL - Abnormal; Notable for the following components:  ? Acetaminophen (Tylenol), Serum <10 (*)   ? All other components within normal limits  ?RESP PANEL BY RT-PCR (FLU A&B, COVID) ARPGX2  ?ETHANOL  ?CBC  ?URINE DRUG SCREEN, QUALITATIVE ([facility] ONLY)  ? ? ? ?EKG ? ? ?RADIOLOGY ? ? ?PROCEDURES: ? ?Critical Care performed: No ? ?Procedures ? ? ? ?MEDICATIONS ORDERED IN ED: ?Medications - No data to display ? ? ?IMPRESSION / MDM / ASSESSMENT AND  PLAN / ED COURSE  ?I reviewed the triage vital signs and the nursing notes. ?             ?               ? ?Differential diagnosis includes, but is not limited to substance-induced psychosis and/or intoxication versus underlying psychiatric illness possibly decompensated.  He is denying any acute physical symptoms and I have a lower suspicion for acute traumatic injury or significant metabolic derangement or acute infectious process. ? ?Serum acetaminophen, salicylate and ethanol level undetectable.  CMP is unremarkable for any significant joint or metabolic derangements.  CBC without leukocytosis or acute anemia. ? ?Psychiatry and TTS consulted. ? ?The patient has been placed in psychiatric observation due to the need to provide a safe environment for the patient while obtaining psychiatric consultation and evaluation, as well as ongoing medical and medication management to treat the patient's condition.  The patient has been placed under full IVC at  this time. ? ? ?  ? ? ?FINAL CLINICAL IMPRESSION(S) / ED DIAGNOSES  ? ?Final diagnoses:  ?Psychosis, unspecified psychosis type (HCC)  ? ? ? ?Rx / DC Orders  ? ?ED Discharge Orders   ? ? None  ? ?  ? ? ? ?Note:  This document was prepared using Dragon voice recognition software and may include unintentional dictation errors. ?  ?Gilles Chiquito, MD ?10/01/21 1137 ? ?

## 2021-10-01 NOTE — ED Notes (Addendum)
Pt asked what his name is and he states,"It's on this thing here." He then shows this tech his current "Doe" name bracelet.  He is told that his name is NOT on the bracelet and pt states, "Yeah, that's funny."  Pt is talking to staff while lying prone with his face in folded arms.  ?

## 2021-10-01 NOTE — ED Notes (Signed)
Hospital meal provided, pt sleeping. Left at bedside ? ?

## 2021-10-01 NOTE — ED Notes (Signed)
Pt is sedated and unable / unwilling to provide any identifying information to staff.  Will try to obtain information again at a later time ?

## 2021-10-01 NOTE — ED Triage Notes (Signed)
Pt comes with [employer] under IVC after being found petting a stranger's dog in her yard. When approached, pt laid down on the ground and hid his face. Was hysterical. Pt denies any drug use. No charted psych or drug hx from this facility. When asked what pt was doing in this lady's yard, pt states "sniffing for food".  ? ?Unable to confirm name/identity. [employer] working on it.  ?

## 2021-10-01 NOTE — ED Notes (Addendum)
Black basketball shorts ?Grey sweatshirt ?Camo crocs ? ?Belongings to quad RN desk  ?

## 2021-10-01 NOTE — Progress Notes (Signed)
Client presented to the ED as a Ronald [name], appears familiar to staff, can not remember their name at this time.  He is agitated and receiving PRN medications, reported using meth. ? ?[name], PMHNP ?

## 2021-10-01 NOTE — ED Notes (Signed)
Pt hyperverbal with aggressive posturing to staff and peers.  ED MD notified PRN order given / administered. Security onsite to assist with medication administration.  Q15 checks continued after medication administration.  ?

## 2021-10-01 NOTE — ED Notes (Signed)
Pt given lunch tray at this time

## 2021-10-02 ENCOUNTER — Encounter: Payer: Self-pay | Admitting: Psychiatric/Mental Health

## 2021-10-02 DIAGNOSIS — F23 Brief psychotic disorder: Secondary | ICD-10-CM

## 2021-10-02 DIAGNOSIS — F15959 Other stimulant use, unspecified with stimulant-induced psychotic disorder, unspecified: Secondary | ICD-10-CM | POA: Diagnosis present

## 2021-10-02 LAB — COMPREHENSIVE METABOLIC PANEL
ALT: 22 U/L (ref 0–44)
AST: 29 U/L (ref 15–41)
Albumin: 4.4 g/dL (ref 3.5–5.0)
Alkaline Phosphatase: 65 U/L (ref 38–126)
Anion gap: 9 (ref 5–15)
BUN: 17 mg/dL (ref 6–20)
CO2: 26 mmol/L (ref 22–32)
Calcium: 9.5 mg/dL (ref 8.9–10.3)
Chloride: 103 mmol/L (ref 98–111)
Creatinine, Ser: 0.89 mg/dL (ref 0.61–1.24)
GFR, Estimated: 57 mL/min — ABNORMAL LOW (ref >60)
Glucose, Bld: 89 mg/dL (ref 70–99)
Potassium: 4 mmol/L (ref 3.5–5.1)
Sodium: 138 mmol/L (ref 135–145)
Total Bilirubin: 1 mg/dL (ref 0.3–1.2)
Total Protein: 8.3 g/dL — ABNORMAL HIGH (ref 6.5–8.1)

## 2021-10-02 LAB — RESP PANEL BY RT-PCR (FLU A&B, COVID) ARPGX2
Influenza A by PCR: NEGATIVE
Influenza B by PCR: NEGATIVE
SARS Coronavirus 2 by RT PCR: NEGATIVE

## 2021-10-02 NOTE — ED Notes (Signed)
Pt asking about possible discharge.  Pt is calm and cooperative.  Pt stated, "I didn't expect to end up here last night, but I bet you hear that from a lot of people."   ?

## 2021-10-02 NOTE — ED Notes (Signed)
Pt discharged home. VS stable. All belongings returned to patient. Pt denies SI.  

## 2021-10-02 NOTE — ED Notes (Signed)
Breakfast tray given. °

## 2021-10-02 NOTE — ED Provider Notes (Signed)
Emergency Medicine Observation Re-evaluation Note ? ?Ronald [name] is a 26 y.o. male, seen on rounds today.  ? ?Physical Exam  ?BP 114/63   Pulse 92   Temp 98.5 ?F (36.9 ?C) (Oral)   Resp 18   SpO2 98%  ?Physical Exam ?General: Patient resting comfortably in bed ?Lungs: Patient in no respiratory distress ?Psych: Patient not combative ? ?ED Course / MDM  ?EKG:  ? ? ?Plan  ?Current plan is for psychiatry evaluation.  Patient is also in need of identification. ?Ronald [name] is under involuntary commitment. ?  ? ?  ?Arnaldo Natal, MD ?10/02/21 [phone] ? ?

## 2021-10-02 NOTE — ED Notes (Signed)
Psych NP at bedside with Clinical research associate. Patient continues to refuse to give name with staff.  ?

## 2021-10-02 NOTE — ED Notes (Signed)
Pt given lunch

## 2021-10-02 NOTE — Discharge Instructions (Signed)
Please follow-up with [facility].  Return for any problems. ?

## 2021-10-02 NOTE — ED Notes (Signed)
IVC/ Consult pending 

## 2021-10-02 NOTE — BH Assessment (Signed)
Comprehensive Clinical Assessment (CCA) Note ? ?10/02/2021 ?Ronald [name] ?[id number] ?Disposition: Consulted with [name], NP, who recommended pt. be observed overnight and reassessed in the AM. Notified Dr. Dolores FrameSung and Alvis Lemmingsawn, RN of disposition recommendation.  ? ?Ronald [name] is an [place] speaking, Black male with an anonymous identity and no know PMH.  Per triage note-- Pt comes with [employer] under IVC after being found petting a stranger's dog in her yard. When approached, pt. laid down on the ground and hid his face. Was hysterical. Pt was asleep, in upon this writer's arrival. Pt presented with disorganized speech and with flight of ideas. Pt was confused and difficult to follow. Throughout the assessment pt was anxious about being able to leave. Pt continued to be unable/unwilling to give his name or any other demographic information. Pt was not oriented and seemed confused. Pt avoided eye contact. Pt had agitated, restless psychomotor activity. Of note, pt. was observed having an extensive conversation with himself. Pt's mood was hypomanic and her affect was labile. Pt denied any drug use prior to arrival. Pt had no insight and impaired judgment. Pt denied current SI/HI. BAL is <10; UDS is + for amphetamines and cannabis.  ? ? ? ?Chief Complaint:  ?Chief Complaint  ?Patient presents with  ? Psychiatric Evaluation  ? ?Visit Diagnosis: substance induced psychosis  ? ? ?CCA Screening, Triage and Referral (STR) ? ?Patient Reported Information ?How did you hear about us? Other (Comment) Mudlogger(Law enforcement) ? ?Referral name: No data recorded ?Referral phone number: No data recorded ? ?Whom do you see for routine medical problems? No data recorded ?Practice/Facility Name: No data recorded ?Practice/Facility Phone Number: No data recorded ?Name of Contact: No data recorded ?Contact Number: No data recorded ?Contact Fax Number: No data recorded ?Prescriber Name: No data recorded ?Prescriber Address (if known): No data  recorded ? ?What Is the Reason for Your Visit/Call Today? Pt comes with [employer] under IVC after being found petting a stranger's dog in her yard. When approached, pt laid down on the ground and hid his face. Was hysterical. ? ?How Long Has This Been Causing You Problems? <Week ? ?What Do You Feel Would Help You the Most Today? Treatment for Depression or other mood problem ? ? ?Have You Recently Been in Any Inpatient Treatment (Hospital/Detox/Crisis Center/28-Day Program)? No data recorded ?Name/Location of Program/Hospital:No data recorded ?How Long Were You There? No data recorded ?When Were You Discharged? No data recorded ? ?Have You Ever Received Services From [facility] Before? No data recorded ?Who Do You [facility] at [facility]? No data recorded ? ?Have You Recently Had Any Thoughts About Hurting Yourself? No ? ?Are You Planning to Commit Suicide/Harm Yourself At This time? No ? ? ?Have you Recently Had Thoughts About Hurting Someone Karolee Ohslse? No ? ?Explanation: No data recorded ? ?Have You Used Any Alcohol or Drugs in the Past 24 Hours? No ? ?How Long Ago Did You Use Drugs or Alcohol? No data recorded ?What Did You Use and How Much? No data recorded ? ?Do You Currently Have a Therapist/Psychiatrist? No ? ?Name of Therapist/Psychiatrist: No data recorded ? ?Have You Been Recently Discharged From Any Office Practice or Programs? No ? ?Explanation of Discharge From Practice/Program: No data recorded ? ?  ?CCA Screening Triage Referral Assessment ?Type of Contact: Face-to-Face ? ?Is this Initial or Reassessment? No data recorded ?Date Telepsych consult ordered in [facility]:  No data recorded ?Time Telepsych consult ordered in [facility]:  No data recorded ? ?  Patient Reported Information Reviewed? No data recorded ?Patient Left Without Being Seen? No data recorded ?Reason for Not Completing Assessment: No data recorded ? ?Collateral Involvement: None provided ? ? ?Does Patient Have a Automotive engineer Guardian? No data  recorded ?Name and Contact of Legal Guardian: No data recorded ?If Minor and Not Living with Parent(s), Who has Custody? n/a ? ?Is CPS involved or ever been involved? Never ? ?Is APS involved or ever been involved? Never ? ? ?Patient Determined To Be At Risk for Harm To Self or Others Based on Review of Patient Reported Information or Presenting Complaint? No ? ?Method: No data recorded ?Availability of Means: No data recorded ?Intent: No data recorded ?Notification Required: No data recorded ?Additional Information for Danger to Others Potential: No data recorded ?Additional Comments for Danger to Others Potential: No data recorded ?Are There Guns or Other Weapons in Your Home? No data recorded ?Types of Guns/Weapons: No data recorded ?Are These Weapons Safely Secured?                            No data recorded ?Who Could Verify You Are Able To Have These Secured: No data recorded ?Do You Have any Outstanding Charges, Pending Court Dates, Parole/Probation? No data recorded ?Contacted To Inform of Risk of Harm To Self or Others: Other: Comment ? ? ?Location of Assessment: [facility] ED ? ? ?Does Patient Present under Involuntary Commitment? Yes ? ?IVC Papers Initial File Date: 10/01/21 ? ? 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 ? ?Patient Currently Receiving the Following Services: Not Receiving Services ? ? ?Determination of Need: Emergent (2 hours) ? ? ?Options For Referral: Other: Comment (Pt recommended for overnight observation and reassessment in the AM.) ? ? ? ? ?CCA Biopsychosocial ?Intake/Chief Complaint:  No data recorded ?Current Symptoms/Problems: No data recorded ? ?Patient Reported Schizophrenia/Schizoaffective Diagnosis in Past: No ? ? ?Strengths: N/A ? ?Preferences: No data recorded ?Abilities: No data recorded ? ?Type of Services Patient Feels are Needed: No data recorded ? ?Initial Clinical Notes/Concerns: No data recorded ? ?Mental Health Symptoms ?Depression:   ?None ?  ?Duration of Depressive symptoms: No  data recorded  ?Mania:   ?Racing thoughts ?  ?Anxiety:    ?Tension; Worrying; Irritability; Restlessness ?  ?Psychosis:   ?Grossly disorganized speech; Hallucinations ?  ?Duration of Psychotic symptoms:  ?Less than six months ?  ?Trauma:   ?N/A ?  ?Obsessions:   ?None ?  ?Compulsions:   ?None ?  ?Inattention:   ?None ?  ?Hyperactivity/Impulsivity:   ?Feeling of restlessness ?  ?Oppositional/Defiant Behaviors:   ?Easily annoyed ?  ?Emotional Irregularity:   ?Mood lability ?  ?Other Mood/Personality Symptoms:   ?n/a ?  ? ?Mental Status Exam ?Appearance and self-care  ?Stature:   ?Average ?  ?Weight:   ?Average weight ?  ?Clothing:   ?-- (In scrubs) ?  ?Grooming:   ?Neglected ?  ?Cosmetic use:   ?None ?  ?Posture/gait:   ?Normal ?  ?Motor activity:   ?Agitated; Restless ?  ?Sensorium  ?Attention:   ?Confused ?  ?Concentration:   ?Anxiety interferes; Focuses on irrelevancies ?  ?Orientation:   ?-- (not oriented) ?  ?Recall/memory:   ?Defective in Recent ?  ?Affect and Mood  ?Affect:   ?Labile ?  ?Mood:   ?Hypomania ?  ?Relating  ?Eye contact:   ?Avoided ?  ?Facial expression:   ?Responsive ?  ?Attitude toward examiner:   ?Resistant; Sarcastic ?  ?  Thought and Language  ?Speech flow:  ?Flight of Ideas ?  ?Thought content:   ?Appropriate to Mood and Circumstances ?  ?Preoccupation:   ?None ?  ?Hallucinations:   ?Auditory ?  ?Organization:  No data recorded  ?Executive Functions  ?Fund of Knowledge:   ?Fair ?  ?Intelligence:   ?Average ?  ?Abstraction:   ?Overly abstract ?  ?Judgement:   ?Poor ?  ?Reality Testing:   ?Unaware ?  ?Insight:   ?Poor ?  ?Decision Making:   ?Confused ?  ?Social Functioning  ?Social Maturity:   ?Irresponsible ?  ?Social Judgement:   ?Impropriety ?  ?Stress  ?Stressors:   ?-- (UTA) ?  ?Coping Ability:   ?Normal ?  ?Skill Deficits:   ?None ?  ?Supports:   ?-- (UTA) ?  ? ? ?Religion: ?Religion/Spirituality ?Are You A Religious Person?:  (UTA) ?How Might This Affect Treatment?:  UTA ? ?Leisure/Recreation: ?Leisure / Recreation ?Do You Have Hobbies?: No ? ?Exercise/Diet: ?Exercise/Diet ?Do You Exercise?: No ?Have You Gained or Lost A Significant Amount of Weight in the Past Six Months?: No ?Do You Follow a Special D

## 2021-10-02 NOTE — Consult Note (Signed)
[facility] Face-to-Face Psychiatry Consult   Reason for Consult:  Psychiatric Evaluation Referring Physician:  EDP Patient Identification: Ronald [name] MRN:  [id number] Principal Diagnosis: Methamphetamine-induced psychotic disorder (HCC) Diagnosis:  Principal Problem:   Methamphetamine-induced psychotic disorder (HCC) Active Problems:   Acute psychosis (HCC)   Total Time spent with patient: 45 minutes  Subjective:  "I've been here before,my name is"  and spelled it.  Client is watching television in his room without issues.  Denies suicidal/homicidal ideations, psychosis, and withdrawal symptoms. He declines detox/rehab, reports needing a job.  Denies other concerns, focused on television during  the assessment.  Psychiatrically stable for discharge.   HPI:  Ronald [name], 26 y.o., male patient seen  by this provider; chart reviewed and consulted with Dr. Lucianne Muss on 10/01/2021.  During evaluation Ronald [name] is laying in bed. He pulls the blankets over his head when this provider approaches for assessment.   He is alert/oriented x 2; bizarre and uncooperative; and mood congruent with affect.  Patient is not speaking when pt is asked questions about what brings him here to the ER. In addition, patient refuses to state his name. He speaks in a low volume, and slowed l pace; with little to no eye contact.  His thought process is incoherent and irrelevant.   Patient has remained calm throughout assessment but hasn't answered any question appropriately.    Recommendation:  Reassess in the am  Past Psychiatric History: Unknown  Risk to Self:   Risk to Others:   Prior Inpatient Therapy:   Prior Outpatient Therapy:    Past Medical History: History reviewed. No pertinent past medical history.  Family History: History reviewed. No pertinent family history. Family Psychiatric  History: Unknown Social History:  Social History   Substance and Sexual Activity  Alcohol Use None     Social  History   Substance and Sexual Activity  Drug Use Not on file    Social History   Socioeconomic History   Marital status: Single    Spouse name: Not on file   Number of children: Not on file   Years of education: Not on file   Highest education level: Not on file  Occupational History   Not on file  Tobacco Use   Smoking status: Not on file   Smokeless tobacco: Not on file  Substance and Sexual Activity   Alcohol use: Not on file   Drug use: Not on file   Sexual activity: Not on file  Other Topics Concern   Not on file  Social History Narrative   Not on file   Social Determinants of Health   Financial Resource Strain: Not on file  Food Insecurity: Not on file  Transportation Needs: Not on file  Physical Activity: Not on file  Stress: Not on file  Social Connections: Not on file   Additional Social History:    Allergies:  Not on File  Labs:  Results for orders placed or performed during the hospital encounter of 10/01/21 (from the past 48 hour(s))  Resp Panel by RT-PCR (Flu A&B, Covid) Nasopharyngeal Swab     Status: None   Collection Time: 10/01/21  7:27 AM   Specimen: Nasopharyngeal Swab; Nasopharyngeal(NP) swabs in vial transport medium  Result Value Ref Range   SARS Coronavirus 2 by RT PCR NEGATIVE NEGATIVE    Comment: (NOTE) SARS-CoV-2 target nucleic acids are NOT DETECTED.  The SARS-CoV-2 RNA is generally detectable in upper respiratory specimens during the acute phase of infection. The lowest  concentration of SARS-CoV-2 viral copies this assay can detect is 138 copies/mL. A negative result does not preclude SARS-Cov-2 infection and should not be used as the sole basis for treatment or other patient management decisions. A negative result may occur with  improper specimen collection/handling, submission of specimen other than nasopharyngeal swab, presence of viral mutation(s) within the areas targeted by this assay, and inadequate number of  viral copies(<138 copies/mL). A negative result must be combined with clinical observations, patient history, and epidemiological information. The expected result is Negative.  Fact Sheet for Patients:  BloggerCourse.com  Fact Sheet for Healthcare Providers:  SeriousBroker.it  This test is no t yet approved or cleared by the [place] FDA and  has been authorized for detection and/or diagnosis of SARS-CoV-2 by FDA under an Emergency Use Authorization (EUA). This EUA will remain  in effect (meaning this test can be used) for the duration of the COVID-19 declaration under Section 564(b)(1) of the Act, 21 U.S.C.section 360bbb-3(b)(1), unless the authorization is terminated  or revoked sooner.       Influenza A by PCR NEGATIVE NEGATIVE   Influenza B by PCR NEGATIVE NEGATIVE    Comment: (NOTE) The Xpert Xpress SARS-CoV-2/FLU/RSV plus assay is intended as an aid in the diagnosis of influenza from Nasopharyngeal swab specimens and should not be used as a sole basis for treatment. Nasal washings and aspirates are unacceptable for Xpert Xpress SARS-CoV-2/FLU/RSV testing.  Fact Sheet for Patients: BloggerCourse.com  Fact Sheet for Healthcare Providers: SeriousBroker.it  This test is not yet approved or cleared by the [place] FDA and has been authorized for detection and/or diagnosis of SARS-CoV-2 by FDA under an Emergency Use Authorization (EUA). This EUA will remain in effect (meaning this test can be used) for the duration of the COVID-19 declaration under Section 564(b)(1) of the Act, 21 U.S.C. section 360bbb-3(b)(1), unless the authorization is terminated or revoked.  Performed at [facility], [address] [place], [place] [address]   Comprehensive metabolic panel     Status: Abnormal   Collection Time: 10/01/21 10:25 AM  Result Value Ref Range   Sodium  138 135 - 145 mmol/L   Potassium 4.0 3.5 - 5.1 mmol/L   Chloride 103 98 - 111 mmol/L   CO2 26 22 - 32 mmol/L   Glucose, Bld 89 70 - 99 mg/dL    Comment: Glucose reference range applies only to samples taken after fasting for at least 8 hours.   BUN 17 6 - 20 mg/dL    Comment: QA FLAGS AND/OR RANGES MODIFIED BY DEMOGRAPHIC UPDATE ON 05/08 AT 0950   Creatinine, Ser 0.89 0.61 - 1.24 mg/dL   Calcium 9.5 8.9 - 17.5 mg/dL   Total Protein 8.3 (H) 6.5 - 8.1 g/dL   Albumin 4.4 3.5 - 5.0 g/dL   AST 29 15 - 41 U/L   ALT 22 0 - 44 U/L   Alkaline Phosphatase 65 38 - 126 U/L   Total Bilirubin 1.0 0.3 - 1.2 mg/dL   GFR, Estimated 57 (L) >60 mL/min    Comment: (NOTE) Calculated using the CKD-EPI Creatinine Equation (2021)    Anion gap 9 5 - 15    Comment: Performed at [facility], [address] [place], [place] [address]  Ethanol     Status: None   Collection Time: 10/01/21 10:25 AM  Result Value Ref Range   Alcohol, Ethyl (B) <10 <10 mg/dL    Comment: (NOTE) Lowest detectable limit for serum alcohol  is 10 mg/dL.  For medical purposes only. Performed at [facility], [address] [place], [place] [address]   cbc     Status: None   Collection Time: 10/01/21 10:25 AM  Result Value Ref Range   WBC 8.3 4.0 - 10.5 K/uL   RBC 5.03 4.22 - 5.81 MIL/uL   Hemoglobin 15.0 13.0 - 17.0 g/dL   HCT 13.246.5 44.039.0 - 10.252.0 %   MCV 92.4 80.0 - 100.0 fL   MCH 29.8 26.0 - 34.0 pg   MCHC 32.3 30.0 - 36.0 g/dL   RDW 72.512.6 36.611.5 - 44.015.5 %   Platelets 370 150 - 400 K/uL   nRBC 0.0 0.0 - 0.2 %    Comment: Performed at [facility], [address] [place], [place] [address]  Salicylate level     Status: Abnormal   Collection Time: 10/01/21 10:25 AM  Result Value Ref Range   Salicylate Lvl <7.0 (L) 7.0 - 30.0 mg/dL    Comment: Performed at [facility], [address] [place], [place] [address]  Acetaminophen level     Status: Abnormal   Collection Time: 10/01/21  10:25 AM  Result Value Ref Range   Acetaminophen (Tylenol), Serum <10 (L) 10 - 30 ug/mL    Comment: (NOTE) Therapeutic concentrations vary significantly. A range of 10-30 ug/mL  may be an effective concentration for many patients. However, some  are best treated at concentrations outside of this range. Acetaminophen concentrations >150 ug/mL at 4 hours after ingestion  and >50 ug/mL at 12 hours after ingestion are often associated with  toxic reactions.  Performed at [facility], [address] [place], [place] [address]   Urine Drug Screen, Qualitative     Status: Abnormal   Collection Time: 10/01/21 12:25 PM  Result Value Ref Range   Tricyclic, Ur Screen NONE DETECTED NONE DETECTED   Amphetamines, Ur Screen POSITIVE (A) NONE DETECTED   MDMA (Ecstasy)Ur Screen NONE DETECTED NONE DETECTED   Cocaine Metabolite,Ur [place] NONE DETECTED NONE DETECTED   Opiate, Ur Screen NONE DETECTED NONE DETECTED   Phencyclidine (PCP) Ur S NONE DETECTED NONE DETECTED   Cannabinoid 50 Ng, Ur [place] POSITIVE (A) NONE DETECTED   Barbiturates, Ur Screen NONE DETECTED NONE DETECTED   Benzodiazepine, Ur Scrn NONE DETECTED NONE DETECTED   Methadone Scn, Ur NONE DETECTED NONE DETECTED    Comment: (NOTE) Tricyclics + metabolites, urine    Cutoff 1000 ng/mL Amphetamines + metabolites, urine  Cutoff 1000 ng/mL MDMA (Ecstasy), urine              Cutoff 500 ng/mL Cocaine Metabolite, urine          Cutoff 300 ng/mL Opiate + metabolites, urine        Cutoff 300 ng/mL Phencyclidine (PCP), urine         Cutoff 25 ng/mL Cannabinoid, urine                 Cutoff 50 ng/mL Barbiturates + metabolites, urine  Cutoff 200 ng/mL Benzodiazepine, urine              Cutoff 200 ng/mL Methadone, urine                   Cutoff 300 ng/mL  The urine drug screen provides only a preliminary, unconfirmed analytical test result and should not be used for non-medical purposes. Clinical consideration and professional judgment  should be applied to any positive drug screen result due to  possible interfering substances. A more specific alternate chemical method must be used in order to obtain a confirmed analytical result. Gas chromatography / mass spectrometry (GC/MS) is the preferred confirm atory method. Performed at [facility], [address] [place], [place] [address]     No current facility-administered medications for this encounter.   No current outpatient medications on file.    Musculoskeletal: Strength & Muscle Tone: within normal limits Gait & Station: normal Patient leans: N/A  Psychiatric Specialty Exam: Physical Exam Vitals and nursing note reviewed.  HENT:     Head: Normocephalic and atraumatic.     Nose: Nose normal.  Pulmonary:     Effort: Pulmonary effort is normal.  Musculoskeletal:        General: Normal range of motion.     Cervical back: Normal range of motion.  Skin:    General: Skin is dry.  Neurological:     General: No focal deficit present.  Psychiatric:        Attention and Perception: Attention and perception normal.        Mood and Affect: Mood is anxious.        Speech: Speech normal.        Behavior: Behavior normal. Behavior is cooperative.        Thought Content: Thought content normal.        Cognition and Memory: Cognition normal.        Judgment: Judgment normal.    Review of Systems  Psychiatric/Behavioral:  Positive for substance abuse. The patient is nervous/anxious.   All other systems reviewed and are negative.  Blood pressure 108/68, pulse 86, temperature 98.5 F (36.9 C), temperature source Oral, resp. rate 20, SpO2 100 %.There is no height or weight on file to calculate BMI.  General Appearance: Casual  Eye Contact:  Fair  Speech:  Normal Rate  Volume:  Normal  Mood:  Anxious  Affect:  Congruent  Thought Process:  Coherent and Descriptions of Associations: Intact  Orientation:  Full (Time, Place, and Person)  Thought Content:   WDL and Logical  Suicidal Thoughts:  No  Homicidal Thoughts:  No  Memory:  Immediate;   Fair Recent;   Fair Remote;   Fair  Judgement:  Fair  Insight:  Fair  Psychomotor Activity:  Normal  Concentration:  Concentration: Fair and Attention Span: Fair  Recall:  Good  Fund of Knowledge:  Fair  Language:  Good  Akathisia:  No  Handed:  Right  AIMS (if indicated):     Assets:  Leisure Time Physical Health Resilience  ADL's:  Intact  Cognition:  WNL  Sleep:        Physical Exam: Physical Exam Vitals and nursing note reviewed.  HENT:     Head: Normocephalic and atraumatic.     Nose: Nose normal.  Pulmonary:     Effort: Pulmonary effort is normal.  Musculoskeletal:        General: Normal range of motion.     Cervical back: Normal range of motion.  Skin:    General: Skin is dry.  Neurological:     General: No focal deficit present.  Psychiatric:        Attention and Perception: Attention and perception normal.        Mood and Affect: Mood is anxious.        Speech: Speech normal.        Behavior: Behavior normal. Behavior is cooperative.        Thought  Content: Thought content normal.        Cognition and Memory: Cognition normal.        Judgment: Judgment normal.   Review of Systems  Psychiatric/Behavioral:  Positive for substance abuse. The patient is nervous/anxious.   All other systems reviewed and are negative. Blood pressure 114/63, pulse 92, temperature 98.5 F (36.9 C), temperature source Oral, resp. rate 18, SpO2 98 %. There is no height or weight on file to calculate BMI.  Plan: Meth induced mood psychosis Recommended rehab, he declined  Disposition: Discharge home  [name], NP 10/02/2021 9:51 AM

## 2021-10-02 NOTE — Consult Note (Signed)
[facility] Face-to-Face Psychiatry Consult   Reason for Consult:  Psychiatric Evaluation Referring Physician:  EDP [name] Identification: Ronald [name] MRN:  [id number] Principal Diagnosis: Acute psychosis (HCC) Diagnosis:  Principal Problem:   Acute psychosis (HCC)   Total Time spent with [name]: 45 minutes  Subjective:     HPI:  Ronald [name], 26 y.o., Ronald [name] seen  by this provider; chart reviewed and consulted with Dr. Lucianne Muss on 10/01/2021.  During evaluation Ronald [name] is laying in bed. He pulls the blankets over his head when this provider approaches for assessment.   He is alert/oriented x 2; bizarre and uncooperative; and mood congruent with affect.  [name] is not speaking when pt is asked questions about what brings him here to the ER. In addition, [name] refuses to state his name. He speaks in a low volume, and slowed l pace; with little to no eye contact.  His thought process is incoherent and irrelevant.   [name] has remained calm throughout assessment but hasn't answered any question appropriately.    Recommendation:  Reassess in the am  Past Psychiatric History: Unknown  Risk to Self:   Risk to Others:   Prior Inpatient Therapy:   Prior Outpatient Therapy:    Past Medical History: History reviewed. No pertinent past medical history.  Family History: History reviewed. No pertinent family history. Family Psychiatric  History: Unknown Social History:  Social History   Substance and Sexual Activity  Alcohol Use None     Social History   Substance and Sexual Activity  Drug Use Not on file    Social History   Socioeconomic History   Marital status: Not on file    Spouse name: Not on file   Number of children: Not on file   Years of education: Not on file   Highest education level: Not on file  Occupational History   Not on file  Tobacco Use   Smoking status: Not on file   Smokeless tobacco: Not on file  Substance and Sexual Activity   Alcohol  use: Not on file   Drug use: Not on file   Sexual activity: Not on file  Other Topics Concern   Not on file  Social History Narrative   Not on file   Social Determinants of Health   Financial Resource Strain: Not on file  Food Insecurity: Not on file  Transportation Needs: Not on file  Physical Activity: Not on file  Stress: Not on file  Social Connections: Not on file   Additional Social History:    Allergies:  Not on File  Labs:  Results for orders placed or performed during the hospital encounter of 10/01/21 (from the past 48 hour(s))  Comprehensive metabolic panel     Status: Abnormal   Collection Time: 10/01/21 10:25 AM  Result Value Ref Range   Sodium 138 135 - 145 mmol/L   Potassium 4.0 3.5 - 5.1 mmol/L   Chloride 103 98 - 111 mmol/L   CO2 26 22 - 32 mmol/L   Glucose, Bld 89 70 - 99 mg/dL    Comment: Glucose reference range applies only to samples taken after fasting for at least 8 hours.   BUN 17 8 - 23 mg/dL   Creatinine, Ser 6.01 0.61 - 1.24 mg/dL   Calcium 9.5 8.9 - 09.3 mg/dL   Total Protein 8.3 (H) 6.5 - 8.1 g/dL   Albumin 4.4 3.5 - 5.0 g/dL   AST 29 15 - 41 U/L  ALT 22 0 - 44 U/L   Alkaline Phosphatase 65 38 - 126 U/L   Total Bilirubin 1.0 0.3 - 1.2 mg/dL   GFR, Estimated 57 (L) >60 mL/min    Comment: (NOTE) Calculated using the CKD-EPI Creatinine Equation (2021)    Anion gap 9 5 - 15    Comment: Performed at [facility], [address] [place], [place] [address]  Ethanol     Status: None   Collection Time: 10/01/21 10:25 AM  Result Value Ref Range   Alcohol, Ethyl (B) <10 <10 mg/dL    Comment: (NOTE) Lowest detectable limit for serum alcohol is 10 mg/dL.  For medical purposes only. Performed at [facility], [address] [place], [place] [address]   cbc     Status: None   Collection Time: 10/01/21 10:25 AM  Result Value Ref Range   WBC 8.3 4.0 - 10.5 K/uL   RBC 5.03 4.22 - 5.81 MIL/uL   Hemoglobin 15.0 13.0 -  17.0 g/dL   HCT 56.246.5 13.039.0 - 86.552.0 %   MCV 92.4 80.0 - 100.0 fL   MCH 29.8 26.0 - 34.0 pg   MCHC 32.3 30.0 - 36.0 g/dL   RDW 78.412.6 69.611.5 - 29.515.5 %   Platelets 370 150 - 400 K/uL   nRBC 0.0 0.0 - 0.2 %    Comment: Performed at [facility], [address] [place], [place] [address]  Salicylate level     Status: Abnormal   Collection Time: 10/01/21 10:25 AM  Result Value Ref Range   Salicylate Lvl <7.0 (L) 7.0 - 30.0 mg/dL    Comment: Performed at [facility], [address] [place], [place] [address]  Acetaminophen level     Status: Abnormal   Collection Time: 10/01/21 10:25 AM  Result Value Ref Range   Acetaminophen (Tylenol), Serum <10 (L) 10 - 30 ug/mL    Comment: (NOTE) Therapeutic concentrations vary significantly. A range of 10-30 ug/mL  may be an effective concentration for many patients. However, some  are best treated at concentrations outside of this range. Acetaminophen concentrations >150 ug/mL at 4 hours after ingestion  and >50 ug/mL at 12 hours after ingestion are often associated with  toxic reactions.  Performed at [facility], [address] [place], [place] [address]   Urine Drug Screen, Qualitative     Status: Abnormal   Collection Time: 10/01/21 12:25 PM  Result Value Ref Range   Tricyclic, Ur Screen NONE DETECTED NONE DETECTED   Amphetamines, Ur Screen POSITIVE (A) NONE DETECTED   MDMA (Ecstasy)Ur Screen NONE DETECTED NONE DETECTED   Cocaine Metabolite,Ur [place] NONE DETECTED NONE DETECTED   Opiate, Ur Screen NONE DETECTED NONE DETECTED   Phencyclidine (PCP) Ur S NONE DETECTED NONE DETECTED   Cannabinoid 50 Ng, Ur [place] POSITIVE (A) NONE DETECTED   Barbiturates, Ur Screen NONE DETECTED NONE DETECTED   Benzodiazepine, Ur Scrn NONE DETECTED NONE DETECTED   Methadone Scn, Ur NONE DETECTED NONE DETECTED    Comment: (NOTE) Tricyclics + metabolites, urine    Cutoff 1000 ng/mL Amphetamines + metabolites, urine  Cutoff 1000 ng/mL MDMA  (Ecstasy), urine              Cutoff 500 ng/mL Cocaine Metabolite, urine          Cutoff 300 ng/mL Opiate + metabolites, urine        Cutoff 300 ng/mL Phencyclidine (PCP), urine         Cutoff 25  ng/mL Cannabinoid, urine                 Cutoff 50 ng/mL Barbiturates + metabolites, urine  Cutoff 200 ng/mL Benzodiazepine, urine              Cutoff 200 ng/mL Methadone, urine                   Cutoff 300 ng/mL  The urine drug screen provides only a preliminary, unconfirmed analytical test result and should not be used for non-medical purposes. Clinical consideration and professional judgment should be applied to any positive drug screen result due to possible interfering substances. A more specific alternate chemical method must be used in order to obtain a confirmed analytical result. Gas chromatography / mass spectrometry (GC/MS) is the preferred confirm atory method. Performed at [facility], [address] [place], [place] [address]     No current facility-administered medications for this encounter.   No current outpatient medications on file.    Musculoskeletal: Strength & Muscle Tone: within normal limits Gait & Station: normal [name] leans: N/A   Psychiatric Specialty Exam:  Presentation  General Appearance: Bizarre  Eye Contact:Fleeting  Speech:Blocked  Speech Volume:Decreased  Handedness:Right   Mood and Affect  Mood:Labile  Affect:Inappropriate   Thought Process  Thought Processes:Disorganized  Descriptions of Associations:Loose  Orientation:-- (unable to assess)  Thought Content:Other (comment) (unable to assess)  History of Schizophrenia/Schizoaffective disorder:-- (unable to assess)  Duration of Psychotic Symptoms:Less than six months  Hallucinations:Hallucinations: Other (comment) (unable to assess)  Ideas of Reference:-- (unable to assess)  Suicidal Thoughts:Suicidal Thoughts: -- (unable to assess)  Homicidal  Thoughts:Homicidal Thoughts: -- (unable to assess)   Sensorium  Memory:-- (unable to assess)  Judgment:Impaired  Insight:Lacking   Executive Functions  Concentration:Poor  Attention Span:Poor  Recall:Poor  Fund of Knowledge:Poor  Language:Poor   Psychomotor Activity  Psychomotor Activity:Psychomotor Activity: Restlessness   Assets  Assets:No data recorded  Sleep  Sleep:Sleep: Fair   Physical Exam: Physical Exam Vitals and nursing note reviewed.  Constitutional:      General: He is in acute distress.  HENT:     Head: Normocephalic and atraumatic.     Nose: Nose normal.  Eyes:     Pupils: Pupils are equal, round, and reactive to light.  Pulmonary:     Effort: Pulmonary effort is normal.  Musculoskeletal:        General: Normal range of motion.     Cervical back: Normal range of motion.  Skin:    General: Skin is dry.  Neurological:     General: No focal deficit present.  Psychiatric:        Attention and Perception: He is inattentive.        Mood and Affect: Affect is inappropriate.        Speech: Speech is delayed and tangential.        Behavior: Behavior is uncooperative.        Cognition and Memory: Cognition is impaired. Memory is impaired.        Judgment: Judgment is inappropriate.   Review of Systems  Psychiatric/Behavioral:  Positive for substance abuse.   All other systems reviewed and are negative. Blood pressure 114/63, pulse 92, temperature 98.5 F (36.9 C), temperature source Oral, resp. rate 18, SpO2 98 %. There is no height or weight on file to calculate BMI.  Disposition: Discussed crisis plan, support from social network, calling 911, coming to the Emergency Department, and calling Suicide Hotline.  Reassess in the AM  Jearld Lesch, NP 10/02/2021 4:25 AM

## 2021-10-03 ENCOUNTER — Encounter: Payer: Self-pay | Admitting: Nurse Practitioner

## 2021-10-17 ENCOUNTER — Emergency Department
Admission: EM | Admit: 2021-10-17 | Discharge: 2021-10-18 | Disposition: A | Discharge status: Other Acute Inpatient Hospital | Payer: No Typology Code available for payment source | Attending: Emergency Medicine | Admitting: Emergency Medicine

## 2021-10-17 ENCOUNTER — Other Ambulatory Visit: Payer: Self-pay

## 2021-10-17 DIAGNOSIS — F29 Unspecified psychosis not due to a substance or known physiological condition: Secondary | ICD-10-CM | POA: Insufficient documentation

## 2021-10-17 DIAGNOSIS — F15959 Other stimulant use, unspecified with stimulant-induced psychotic disorder, unspecified: Secondary | ICD-10-CM | POA: Diagnosis present

## 2021-10-17 DIAGNOSIS — R4689 Other symptoms and signs involving appearance and behavior: Secondary | ICD-10-CM | POA: Diagnosis not present

## 2021-10-17 DIAGNOSIS — F15288 Other stimulant dependence with other stimulant-induced disorder: Secondary | ICD-10-CM | POA: Insufficient documentation

## 2021-10-17 DIAGNOSIS — Z046 Encounter for general psychiatric examination, requested by authority: Secondary | ICD-10-CM | POA: Diagnosis present

## 2021-10-17 DIAGNOSIS — Y900 Blood alcohol level of less than 20 mg/100 ml: Secondary | ICD-10-CM | POA: Insufficient documentation

## 2021-10-17 DIAGNOSIS — F1210 Cannabis abuse, uncomplicated: Secondary | ICD-10-CM | POA: Diagnosis not present

## 2021-10-17 DIAGNOSIS — Z20822 Contact with and (suspected) exposure to covid-19: Secondary | ICD-10-CM | POA: Insufficient documentation

## 2021-10-17 DIAGNOSIS — F23 Brief psychotic disorder: Secondary | ICD-10-CM | POA: Diagnosis present

## 2021-10-17 LAB — CBC
HCT: 45.7 % (ref 39.0–52.0)
Hemoglobin: 14.5 g/dL (ref 13.0–17.0)
MCH: 29.7 pg (ref 26.0–34.0)
MCHC: 31.7 g/dL (ref 30.0–36.0)
MCV: 93.6 fL (ref 80.0–100.0)
Platelets: 356 10*3/uL (ref 150–400)
RBC: 4.88 MIL/uL (ref 4.22–5.81)
RDW: 12.6 % (ref 11.5–15.5)
WBC: 9.5 10*3/uL (ref 4.0–10.5)
nRBC: 0 % (ref 0.0–0.2)

## 2021-10-17 LAB — COMPREHENSIVE METABOLIC PANEL
ALT: 21 U/L (ref 0–44)
AST: 35 U/L (ref 15–41)
Albumin: 4.3 g/dL (ref 3.5–5.0)
Alkaline Phosphatase: 60 U/L (ref 38–126)
Anion gap: 7 (ref 5–15)
BUN: 13 mg/dL (ref 6–20)
CO2: 27 mmol/L (ref 22–32)
Calcium: 9.7 mg/dL (ref 8.9–10.3)
Chloride: 104 mmol/L (ref 98–111)
Creatinine, Ser: 0.95 mg/dL (ref 0.61–1.24)
GFR, Estimated: >60 mL/min (ref >60)
Glucose, Bld: 89 mg/dL (ref 70–99)
Potassium: 3.6 mmol/L (ref 3.5–5.1)
Sodium: 138 mmol/L (ref 135–145)
Total Bilirubin: 0.7 mg/dL (ref 0.3–1.2)
Total Protein: 8.4 g/dL — ABNORMAL HIGH (ref 6.5–8.1)

## 2021-10-17 LAB — RESP PANEL BY RT-PCR (FLU A&B, COVID) ARPGX2
Influenza A by PCR: NEGATIVE
Influenza B by PCR: NEGATIVE
SARS Coronavirus 2 by RT PCR: NEGATIVE

## 2021-10-17 LAB — ACETAMINOPHEN LEVEL: Acetaminophen (Tylenol), Serum: <10 ug/mL — ABNORMAL LOW (ref 10–30)

## 2021-10-17 LAB — ETHANOL: Alcohol, Ethyl (B): <10 mg/dL (ref <10)

## 2021-10-17 LAB — SALICYLATE LEVEL: Salicylate Lvl: <7 mg/dL — ABNORMAL LOW (ref 7.0–30.0)

## 2021-10-17 MED ORDER — BENZTROPINE MESYLATE 1 MG PO TABS
0.5000 mg | ORAL_TABLET | Freq: Every day | ORAL | Status: DC
  Administered 2021-10-17: 0.5 mg via ORAL
  Filled 2021-10-17: qty 1

## 2021-10-17 MED ORDER — RISPERIDONE 0.5 MG PO TBDP
1.0000 mg | ORAL_TABLET | Freq: Two times a day (BID) | ORAL | Status: DC
  Administered 2021-10-17 – 2021-10-18 (×2): 1 mg via ORAL
  Filled 2021-10-17 (×2): qty 2

## 2021-10-17 NOTE — ED Triage Notes (Signed)
Pt to ED via [employer] under IVC with paperwork that states patient has hx of schizophrenia, threatening family members, danger to self and others, not taking medications.  Patient is not answering questions in triage, laughs when asked if SI.  Officer states pt admitted to meth use prior to their arrival and pt states using weed.  Pt constantly moving, unable to sit still, not leaving monitoring cords on at this time to obtain vitals and spit out thermometer.  Pt arrived in hand cuffs and removed at this time to attempt blood work and dress out in triage.  Belongings placed into 1 bag and kept at hospital, contents include: 1 pair gray rubber shoes, 1 stripped t shirt, 1 pair khaki pants, 1 black belt, 1 pair red shorts, 1 pair plaid boxers.

## 2021-10-17 NOTE — ED Notes (Signed)
Pt states that he did not use meth, only used a lot of caffeine as he has a lot of things to do.

## 2021-10-17 NOTE — ED Notes (Signed)
IVC, pend psych consult 

## 2021-10-17 NOTE — ED Notes (Signed)
Patient transferred from Triage to room 20 after dressing out and screening for contraband. Report received from [facility] including situation, background, assessment and recommendations. Pt oriented to [employer] including Q15 minute rounds as well as Psychologist, counselling for their protection. Patient is alert and oriented, warm and dry in no acute distress. Patient denies SI, HI, and AVH. Pt. Encouraged to let this nurse know if needs arise.

## 2021-10-17 NOTE — ED Provider Notes (Signed)
[facility] Provider Note    Event Date/Time   First MD Initiated Contact with Patient 10/17/21 2153     (approximate)   History   Psychiatric Evaluation   HPI  Ronald [name] is a 26 y.o. male  who, per psychiatry note dated 10/02/21 has history of methamphetamine induced psychotic disorder, who presents to the emergency department today because of concern for abnormal behavior.  The patient himself is unable to give any significant history.  Physical Exam   Triage Vital Signs: ED Triage Vitals  Enc Vitals Group     BP --      Pulse --      Resp --      Temp --      Temp src --      SpO2 --      Weight 10/17/21 2147 163 lb 2.3 oz (74 kg)     Height 10/17/21 2147 5\' 8"  (1.727 m)     Head Circumference --      Peak Flow --      Pain Score 10/17/21 2146 0     Pain Loc --      Pain Edu? --      Excl. in GC? --     Most recent vital signs: There were no vitals filed for this visit.   General: Awake, alert. CV:  Good peripheral perfusion.  Resp:  Normal effort.  Abd:  No distention.  Psych:  Agitated, appearing to respond to internal stimuli.   ED Results / Procedures / Treatments   Labs (all labs ordered are listed, but only abnormal results are displayed) Labs Reviewed  COMPREHENSIVE METABOLIC PANEL - Abnormal; Notable for the following components:      Result Value   Total Protein 8.4 (*)    All other components within normal limits  SALICYLATE LEVEL - Abnormal; Notable for the following components:   Salicylate Lvl <7.0 (*)    All other components within normal limits  ACETAMINOPHEN LEVEL - Abnormal; Notable for the following components:   Acetaminophen (Tylenol), Serum <10 (*)    All other components within normal limits  RESP PANEL BY RT-PCR (FLU A&B, COVID) ARPGX2  ETHANOL  CBC  URINE DRUG SCREEN, QUALITATIVE ([facility] ONLY)     EKG  None   RADIOLOGY None   PROCEDURES:  Critical Care performed:  No  Procedures   MEDICATIONS ORDERED IN ED: Medications - No data to display   IMPRESSION / MDM / ASSESSMENT AND PLAN / ED COURSE  I reviewed the triage vital signs and the nursing notes.                              Differential diagnosis includes, but is not limited to, psychosis, drug-induced mood disorder.  Patient presented to the emergency department today under IVC.  On exam patient is agitated and exhibiting abnormal behavior.  I do concerns for possible methamphetamine induced psychosis which she has had in the past.  We will continue IVC and have psychiatry evaluate.  The patient has been placed in psychiatric observation due to the need to provide a safe environment for the patient while obtaining psychiatric consultation and evaluation, as well as ongoing medical and medication management to treat the patient's condition.  The patient has been placed under full IVC at this time.    FINAL CLINICAL IMPRESSION(S) / ED DIAGNOSES   Final diagnoses:  Abnormal behavior  Note:  This document was prepared using Dragon voice recognition software and may include unintentional dictation errors.    Phineas Semen, MD 10/17/21 2312

## 2021-10-18 DIAGNOSIS — R4689 Other symptoms and signs involving appearance and behavior: Secondary | ICD-10-CM | POA: Insufficient documentation

## 2021-10-18 NOTE — ED Notes (Addendum)
Attempt to call report, unsuccessful. [phone].

## 2021-10-18 NOTE — BH Assessment (Signed)
Patient has been accepted to [facility].  Patient assigned to [facility]. Accepting physician is Dr. Landry Mellow.  Call report to [phone].  Representative was Brie.   ER Staff is aware of it:  Rivka Barbara, ER Secretary  Dr. Elesa Massed, ER MD  Thayer Ohm, Patient's Nurse     Patient's bed will be available anytime after 9 AM 10/18/21.

## 2021-10-18 NOTE — ED Notes (Addendum)
Attempt to call report unsuccessful.[phone]

## 2021-10-18 NOTE — ED Notes (Signed)
Hospital meal provided.  100% consumed, pt tolerated w/o complaints.  Waste discarded appropriately.   

## 2021-10-18 NOTE — Consult Note (Signed)
[facility] Psychiatry Consult   Reason for Consult:Psychiatric Evaluation Referring Physician: Dr. 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 male patient presented to [facility] ED via law enforcement under involuntary commitment status (IVC). Per the patient's chart review, the patient has a hx of alcohol, cannabis, and meth abuse.  Per the triage note-- The patient went to ED via [employer] under IVC with paperwork that states the patient has hx of schizophrenia, is threatening family members, is a danger to self and others, and is not taking medications.  The patient was sound asleep upon this writer's arrival. This provider saw The patient face-to-face; the chart was reviewed, and consulted with Dr. Leonides Schanz on 10/18/2021 due to the patient's care. It was discussed with the EDP that the patient does meet the criteria to be admitted to the psychiatric inpatient unit.  On evaluation, the patient is alert, calm, uncooperative, and mood-congruent with affect. The patient does not appear to be responding to internal or external stimuli. He is presenting with some delusional thinking. The patient is presenting with some psychotic and paranoid behaviors. During an encounter with the patient, he could not answer questions appropriately.  HPI: Per Dr. Archie Balboa, Ronald [name] is a 26 y.o. male  who, per psychiatry note dated 10/02/21 has history of methamphetamine induced psychotic disorder, who presents to the emergency department today because of concern for abnormal behavior.  The patient himself is unable to give any significant history.  Past Psychiatric History:   Risk to Self:   Risk to Others:   Prior Inpatient Therapy:    Prior Outpatient Therapy:    Past Medical History:  Past Medical History:  Diagnosis Date   Asthma    No past surgical history on file. Family History: No family history on file. Family Psychiatric  History:  Social History:  Social History   Substance and Sexual Activity  Alcohol Use Yes   Alcohol/week: 2.0 standard drinks   Types: 2 Cans of beer per week   Comment: every other week     Social History   Substance and Sexual Activity  Drug Use Yes   Types: Marijuana   Comment: weekly    Social History   Socioeconomic History   Marital status: Single    Spouse name: Not on file   Number of children: Not on file   Years of education: Not on file   Highest education level: Not on file  Occupational History   Not on file  Tobacco Use   Smoking status: Not on file   Smokeless tobacco: Never  Vaping Use   Vaping Use: Never used  Substance and Sexual Activity   Alcohol use: Yes    Alcohol/week: 2.0 standard drinks    Types: 2 Cans of beer per week    Comment: every other week   Drug use: Yes    Types: Marijuana    Comment: weekly   Sexual activity: Yes    Partners: Female    Birth control/protection: Condom  Other Topics Concern   Not on file  Social History Narrative   ** Merged History Encounter **       Social Determinants of Health   Financial Resource Strain: Not on file  Food Insecurity: Not  on file  Transportation Needs: Not on file  Physical Activity: Not on file  Stress: Not on file  Social Connections: Not on file   Additional Social History:    Allergies:  No Known Allergies  Labs:  Results for orders placed or performed during the hospital encounter of 10/17/21 (from the past 48 hour(s))  Comprehensive metabolic panel     Status: Abnormal   Collection Time: 10/17/21  9:52 PM  Result Value Ref Range   Sodium 138 135 - 145 mmol/L   Potassium 3.6 3.5 - 5.1 mmol/L   Chloride 104 98 - 111 mmol/L   CO2 27 22 - 32 mmol/L   Glucose, Bld  89 70 - 99 mg/dL    Comment: Glucose reference range applies only to samples taken after fasting for at least 8 hours.   BUN 13 6 - 20 mg/dL   Creatinine, Ser 0.95 0.61 - 1.24 mg/dL   Calcium 9.7 8.9 - 10.3 mg/dL   Total Protein 8.4 (H) 6.5 - 8.1 g/dL   Albumin 4.3 3.5 - 5.0 g/dL   AST 35 15 - 41 U/L   ALT 21 0 - 44 U/L   Alkaline Phosphatase 60 38 - 126 U/L   Total Bilirubin 0.7 0.3 - 1.2 mg/dL   GFR, Estimated >60 >60 mL/min    Comment: (NOTE) Calculated using the CKD-EPI Creatinine Equation (2021)    Anion gap 7 5 - 15    Comment: Performed at [facility], [address] [place], [place] [address]  Ethanol     Status: None   Collection Time: 10/17/21  9:52 PM  Result Value Ref Range   Alcohol, Ethyl (B) <10 <10 mg/dL    Comment: (NOTE) Lowest detectable limit for serum alcohol is 10 mg/dL.  For medical purposes only. Performed at [facility], [address] [place], [place] [id number]   Salicylate level     Status: Abnormal   Collection Time: 10/17/21  9:52 PM  Result Value Ref Range   Salicylate Lvl Q000111Q (L) 7.0 - 30.0 mg/dL    Comment: Performed at [facility], [place], [place] [address]  Acetaminophen level     Status: Abnormal   Collection Time: 10/17/21  9:52 PM  Result Value Ref Range   Acetaminophen (Tylenol), Serum <10 (L) 10 - 30 ug/mL    Comment: (NOTE) Therapeutic concentrations vary significantly. A range of 10-30 ug/mL  may be an effective concentration for many patients. However, some  are best treated at concentrations outside of this range. Acetaminophen concentrations >150 ug/mL at 4 hours after ingestion  and >50 ug/mL at 12 hours after ingestion are often associated with  toxic reactions.  Performed at [facility], [address] [place], [place] [address]   cbc     Status: None   Collection Time: 10/17/21  9:52 PM  Result Value Ref Range   WBC 9.5 4.0 - 10.5 K/uL   RBC 4.88 4.22 -  5.81 MIL/uL   Hemoglobin 14.5 13.0 - 17.0 g/dL   HCT 45.7 39.0 - 52.0 %   MCV 93.6 80.0 - 100.0 fL   MCH 29.7 26.0 - 34.0 pg   MCHC 31.7 30.0 - 36.0 g/dL   RDW 12.6 11.5 - 15.5 %   Platelets 356 150 - 400 K/uL   nRBC 0.0 0.0 - 0.2 %    Comment: Performed at [facility], [address], [place], [place] [address]  Resp Panel  by RT-PCR (Flu A&B, Covid) Nasopharyngeal Swab     Status: None   Collection Time: 10/17/21 10:14 PM   Specimen: Nasopharyngeal Swab; Nasopharyngeal(NP) swabs in vial transport medium  Result Value Ref Range   SARS Coronavirus 2 by RT PCR NEGATIVE NEGATIVE    Comment: (NOTE) SARS-CoV-2 target nucleic acids are NOT DETECTED.  The SARS-CoV-2 RNA is generally detectable in upper respiratory specimens during the acute phase of infection. The lowest concentration of SARS-CoV-2 viral copies this assay can detect is 138 copies/mL. A negative result does not preclude SARS-Cov-2 infection and should not be used as the sole basis for treatment or other patient management decisions. A negative result may occur with  improper specimen collection/handling, submission of specimen other than nasopharyngeal swab, presence of viral mutation(s) within the areas targeted by this assay, and inadequate number of viral copies(<138 copies/mL). A negative result must be combined with clinical observations, patient history, and epidemiological information. The expected result is Negative.  Fact Sheet for Patients:  EntrepreneurPulse.com.au  Fact Sheet for Healthcare Providers:  IncredibleEmployment.be  This test is no t yet approved or cleared by the [place] FDA and  has been authorized for detection and/or diagnosis of SARS-CoV-2 by FDA under an Emergency Use Authorization (EUA). This EUA will remain  in effect (meaning this test can be used) for the duration of the COVID-19 declaration under Section 564(b)(1) of the Act,  21 U.S.C.section 360bbb-3(b)(1), unless the authorization is terminated  or revoked sooner.       Influenza A by PCR NEGATIVE NEGATIVE   Influenza B by PCR NEGATIVE NEGATIVE    Comment: (NOTE) The Xpert Xpress SARS-CoV-2/FLU/RSV plus assay is intended as an aid in the diagnosis of influenza from Nasopharyngeal swab specimens and should not be used as a sole basis for treatment. Nasal washings and aspirates are unacceptable for Xpert Xpress SARS-CoV-2/FLU/RSV testing.  Fact Sheet for Patients: EntrepreneurPulse.com.au  Fact Sheet for Healthcare Providers: IncredibleEmployment.be  This test is not yet approved or cleared by the [place] FDA and has been authorized for detection and/or diagnosis of SARS-CoV-2 by FDA under an Emergency Use Authorization (EUA). This EUA will remain in effect (meaning this test can be used) for the duration of the COVID-19 declaration under Section 564(b)(1) of the Act, 21 U.S.C. section 360bbb-3(b)(1), unless the authorization is terminated or revoked.  Performed at [facility], [address] [place], [place] [address]     Current Facility-Administered Medications  Medication Dose Route Frequency Provider Last Rate Last Admin   benztropine (COGENTIN) tablet 0.5 mg  0.5 mg Oral QHS Ward, Kristen N, DO   0.5 mg at 10/17/21 2332   risperiDONE (RISPERDAL M-TABS) disintegrating tablet 1 mg  1 mg Oral BID Ward, Kristen N, DO   1 mg at 10/17/21 2333   Current Outpatient Medications  Medication Sig Dispense Refill   benztropine (COGENTIN) 0.5 MG tablet Take 1 tablet (0.5 mg total) by mouth at bedtime. (Patient not taking: Reported on 01/29/2020) 30 tablet 0   risperiDONE (RISPERDAL M-TABS) 1 MG disintegrating tablet Take 1 tablet (1 mg total) by mouth 2 (two) times daily. (Patient not taking: Reported on 04/05/2021) 60 tablet 1    Musculoskeletal: Strength & Muscle Tone: within normal limits Gait &  Station: normal Patient leans: N/A  Psychiatric Specialty Exam:  Presentation  General Appearance: Disheveled  Eye Contact:None  Speech:Blocked  Speech Volume:Decreased  Handedness:Right   Mood and Affect  Mood:Labile  Affect:Inappropriate   Thought Process  Thought Processes:Disorganized  Descriptions of Associations:Loose  Orientation:None  Thought Content:Other (comment)  History of Schizophrenia/Schizoaffective disorder:No  Duration of Psychotic Symptoms:Less than six months  Hallucinations:Hallucinations: Other (comment)  Ideas of Reference:None  Suicidal Thoughts:Suicidal Thoughts: No  Homicidal Thoughts:Homicidal Thoughts: No   Sensorium  Memory:Immediate Poor; Recent Poor; Remote Poor  Judgment:Impaired  Insight:Lacking   Executive Functions  Concentration:Poor  Attention Span:Poor  Recall:Poor  Fund of Knowledge:Poor  Language:Poor   Psychomotor Activity  Psychomotor Activity:Psychomotor Activity: Restlessness   Assets  Assets:Financial Resources/Insurance; Resilience; Leisure Time; Desire for Improvement; Vocational/Educational   Sleep  Sleep:Sleep: Fair   Physical Exam: Physical Exam Vitals and nursing note reviewed.  HENT:     Head: Normocephalic and atraumatic.     Right Ear: External ear normal.     Left Ear: External ear normal.     Nose: Nose normal.  Cardiovascular:     Rate and Rhythm: Normal rate and regular rhythm.     Pulses: Normal pulses.  Pulmonary:     Effort: Pulmonary effort is normal.  Musculoskeletal:        General: Normal range of motion.     Cervical back: Normal range of motion.  Neurological:     Mental Status: He is alert. He is disoriented.  Psychiatric:        Attention and Perception: He perceives auditory hallucinations.        Mood and Affect: Mood is depressed. Affect is blunt.        Speech: He is noncommunicative.        Behavior: Behavior is withdrawn.        Thought  Content: Thought content is delusional.        Cognition and Memory: Cognition is impaired. Memory is impaired. He exhibits impaired recent memory and impaired remote memory.        Judgment: Judgment is inappropriate.   Review of Systems  Psychiatric/Behavioral:  Positive for hallucinations and substance abuse.   Blood pressure 135/86, pulse 89, temperature 99.2 F (37.3 C), resp. rate 18, height 5\' 8"  (1.727 m), weight 74 kg, SpO2 97 %. Body mass index is 24.81 kg/m.  Treatment Plan Summary: Medication management and Plan The patient is a safety risk to himself and requires psychiatric inpatient admission for stabilization and treatment.  Disposition: Recommend psychiatric Inpatient admission when medically cleared. Supportive therapy provided about ongoing stressors.  Caroline Sauger, NP 10/18/2021 2:30 AM

## 2021-10-18 NOTE — ED Notes (Signed)
Direct number to Burna Mortimer [phone] for report, no answer.

## 2021-10-18 NOTE — BH Assessment (Signed)
Referral information for Psychiatric Hospitalization faxed to:  [facility] [phone])  [facility] [phone]),   [facility] [phone]),  [facility] [phone]),   [facility] [phone]).  [facility] [phone])

## 2021-10-18 NOTE — ED Notes (Addendum)
Attempt to call report, unsuccessful.[phone]

## 2021-10-18 NOTE — ED Notes (Addendum)
Attempt to give report, unsuccessful. [phone].

## 2021-10-18 NOTE — ED Notes (Signed)
IVC called [facility] for transport to [facility], delayed calling in due to fact they would not take report from [place]
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
## 2021-10-18 NOTE — ED Provider Notes (Signed)
Emergency Medicine Observation Re-evaluation Note  Ronald [name] is a 26 y.o. male, seen on rounds today.  Pt initially presented to the ED for complaints of Psychiatric Evaluation Currently, the patient is resting.  Physical Exam  BP 135/86   Pulse 89   Temp 99.2 F (37.3 C)   Resp 18   Ht 5\' 8"  (1.727 m)   Wt 74 kg   SpO2 97%   BMI 24.81 kg/m  Physical Exam Gen: No acute distress  Resp: Normal rise and fall of chest Neuro: Moving all four extremities Psych: Resting currently, calm and cooperative when awake  ED Course / MDM  EKG:   I have reviewed the labs performed to date as well as medications administered while in observation.  Recent changes in the last 24 hours include no acute events overnight.  Plan  Current plan  - patient accepted to [facility] after 9 AM. Ronald [name] is under involuntary commitment.      Ronald [name], Delice Bison, DO 10/18/21 [phone]
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
## 2021-10-18 NOTE — ED Notes (Signed)
Transferred to [facility] via [facility]. Left with 1 bag of belongings.

## 2021-10-18 NOTE — BH Assessment (Signed)
This Clinical research associate attempted to contact Ronald [name] (Mother)  [phone] for collateral; however there was no answer. A HIPPA compliant voicemail was left requesting a call back.

## 2021-10-18 NOTE — ED Notes (Signed)
IVC/Pending Placement 

## 2021-10-30 ENCOUNTER — Other Ambulatory Visit: Payer: Self-pay

## 2021-10-30 ENCOUNTER — Emergency Department
Admission: EM | Admit: 2021-10-30 | Discharge: 2021-11-01 | Disposition: A | Discharge status: Other Acute Inpatient Hospital | Payer: No Typology Code available for payment source | Attending: Emergency Medicine | Admitting: Emergency Medicine

## 2021-10-30 DIAGNOSIS — J45909 Unspecified asthma, uncomplicated: Secondary | ICD-10-CM | POA: Insufficient documentation

## 2021-10-30 DIAGNOSIS — Y906 Blood alcohol level of 120-199 mg/100 ml: Secondary | ICD-10-CM | POA: Insufficient documentation

## 2021-10-30 DIAGNOSIS — Z20822 Contact with and (suspected) exposure to covid-19: Secondary | ICD-10-CM | POA: Insufficient documentation

## 2021-10-30 DIAGNOSIS — F29 Unspecified psychosis not due to a substance or known physiological condition: Secondary | ICD-10-CM | POA: Insufficient documentation

## 2021-10-30 DIAGNOSIS — Z79899 Other long term (current) drug therapy: Secondary | ICD-10-CM | POA: Insufficient documentation

## 2021-10-30 DIAGNOSIS — F209 Schizophrenia, unspecified: Secondary | ICD-10-CM | POA: Diagnosis not present

## 2021-10-30 DIAGNOSIS — R451 Restlessness and agitation: Secondary | ICD-10-CM | POA: Diagnosis present

## 2021-10-30 DIAGNOSIS — F23 Brief psychotic disorder: Secondary | ICD-10-CM | POA: Diagnosis not present

## 2021-10-30 LAB — COMPREHENSIVE METABOLIC PANEL WITH GFR
ALT: 20 U/L (ref 0–44)
AST: 31 U/L (ref 15–41)
Albumin: 3.8 g/dL (ref 3.5–5.0)
Alkaline Phosphatase: 73 U/L (ref 38–126)
Anion gap: 10 (ref 5–15)
BUN: 10 mg/dL (ref 6–20)
CO2: 21 mmol/L — ABNORMAL LOW (ref 22–32)
Calcium: 8.9 mg/dL (ref 8.9–10.3)
Chloride: 112 mmol/L — ABNORMAL HIGH (ref 98–111)
Creatinine, Ser: 0.94 mg/dL (ref 0.61–1.24)
GFR, Estimated: >60 mL/min
Glucose, Bld: 94 mg/dL (ref 70–99)
Potassium: 3.8 mmol/L (ref 3.5–5.1)
Sodium: 143 mmol/L (ref 135–145)
Total Bilirubin: 0.3 mg/dL (ref 0.3–1.2)
Total Protein: 7.3 g/dL (ref 6.5–8.1)

## 2021-10-30 LAB — CBC
HCT: 43.4 % (ref 39.0–52.0)
Hemoglobin: 14.1 g/dL (ref 13.0–17.0)
MCH: 30.9 pg (ref 26.0–34.0)
MCHC: 32.5 g/dL (ref 30.0–36.0)
MCV: 95 fL (ref 80.0–100.0)
Platelets: 306 10*3/uL (ref 150–400)
RBC: 4.57 MIL/uL (ref 4.22–5.81)
RDW: 12.6 % (ref 11.5–15.5)
WBC: 7.8 10*3/uL (ref 4.0–10.5)
nRBC: 0 % (ref 0.0–0.2)

## 2021-10-30 LAB — URINE DRUG SCREEN, QUALITATIVE (ARMC ONLY)
Amphetamines, Ur Screen: POSITIVE — AB
Barbiturates, Ur Screen: NOT DETECTED
Benzodiazepine, Ur Scrn: NOT DETECTED
Cannabinoid 50 Ng, Ur ~~LOC~~: POSITIVE — AB
Cocaine Metabolite,Ur ~~LOC~~: NOT DETECTED
MDMA (Ecstasy)Ur Screen: NOT DETECTED
Methadone Scn, Ur: NOT DETECTED
Opiate, Ur Screen: NOT DETECTED
Phencyclidine (PCP) Ur S: NOT DETECTED
Tricyclic, Ur Screen: NOT DETECTED

## 2021-10-30 LAB — ETHANOL: Alcohol, Ethyl (B): 138 mg/dL — ABNORMAL HIGH (ref <10)

## 2021-10-30 LAB — ACETAMINOPHEN LEVEL: Acetaminophen (Tylenol), Serum: <10 ug/mL — ABNORMAL LOW (ref 10–30)

## 2021-10-30 LAB — SALICYLATE LEVEL: Salicylate Lvl: <7 mg/dL — ABNORMAL LOW (ref 7.0–30.0)

## 2021-10-30 MED ORDER — HYDROXYZINE HCL 25 MG PO TABS: 50.0000 mg | ORAL_TABLET | Freq: Three times a day (TID) | ORAL | Status: DC | PRN

## 2021-10-30 MED ORDER — RISPERIDONE 1 MG PO TBDP
1.0000 mg | ORAL_TABLET | Freq: Two times a day (BID) | ORAL | Status: DC
  Administered 2021-10-30 – 2021-11-01 (×4): 1 mg via ORAL
  Filled 2021-10-30: qty 2
  Filled 2021-10-30: qty 1
  Filled 2021-10-30: qty 2
  Filled 2021-10-30 (×2): qty 1

## 2021-10-30 MED ORDER — BENZTROPINE MESYLATE 1 MG PO TABS
0.5000 mg | ORAL_TABLET | Freq: Every day | ORAL | Status: DC
Start: 2021-10-30 — End: 2021-11-01
  Administered 2021-10-30 – 2021-10-31 (×2): 0.5 mg via ORAL
  Filled 2021-10-30 (×2): qty 1

## 2021-10-30 MED ORDER — ZIPRASIDONE MESYLATE 20 MG IM SOLR
20.0000 mg | Freq: Once | INTRAMUSCULAR | Status: AC
Start: 2021-10-30 — End: 2021-10-30
  Administered 2021-10-30: 20 mg via INTRAMUSCULAR

## 2021-10-30 NOTE — ED Notes (Signed)
Obtained urine sample and sent to lab.

## 2021-10-30 NOTE — ED Notes (Signed)
Pt asleep, dinner tray and drink left at bedside at this time.

## 2021-10-30 NOTE — ED Provider Notes (Addendum)
[facility] Provider Note    Event Date/Time   First MD Initiated Contact with Patient 10/30/21 [phone]     (approximate)   History   Psychiatric Evaluation   HPI  Level V caveat: Limited by combativeness  Ronald [name] is a 26 y.o. male brought to the ED via [facility] police from home.  Patient has a history of schizophrenia; was recently sent to [facility] last week.  Mother called law enforcement due to patient being agitated, drinking alcohol and being combative with mother.  Rest of history is limited due to patient screaming, cursing and being combative.     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 No pertinent surgical history.   Home Medications   Prior to Admission medications   Medication Sig Start Date End Date Taking? Authorizing Provider  benztropine (COGENTIN) 0.5 MG tablet Take 1 tablet (0.5 mg total) by mouth at bedtime. Patient not taking: Reported on 01/29/2020 05/08/19   Clapacs, Jackquline Denmark, MD  risperiDONE (RISPERDAL M-TABS) 1 MG disintegrating tablet Take 1 tablet (1 mg total) by mouth 2 (two) times daily. Patient not taking: Reported on 04/05/2021 10/25/20   Clapacs, Jackquline Denmark, MD     Allergies  Patient has no known allergies.   Family History  No family history on file.   Physical Exam  Triage Vital Signs: ED Triage Vitals  Enc Vitals Group     BP 10/30/21 0440 116/74     Pulse Rate 10/30/21 0440 79     Resp 10/30/21 0440 16     Temp 10/30/21 0440 97.6 F (36.4 C)      Temp Source 10/30/21 0440 Axillary     SpO2 10/30/21 0440 98 %     Weight 10/30/21 0453 163 lb 2.3 oz (74 kg)     Height 10/30/21 0453 5\' 8"  (1.727 m)     Head Circumference --      Peak Flow --      Pain Score 10/30/21 0440 0     Pain Loc --      Pain Edu? --      Excl. in GC? --     Updated Vital Signs: BP 116/74   Pulse 79   Temp 97.6 F (36.4 C) (Axillary)   Resp 16   Ht 5\' 8"  (1.727 m)   Wt 74 kg   SpO2 98%   BMI 24.81 kg/m    General: Awake, moderate distress.  Disheveled. CV:  RRR.  Good peripheral perfusion.  Resp:  Normal effort.  CTA B. Abd:  No distention.  Other:  Agitated, screaming, cursing, combative.   ED Results / Procedures / Treatments  Labs (all labs ordered are listed, but only abnormal results are displayed) Labs Reviewed  COMPREHENSIVE METABOLIC PANEL - Abnormal; Notable for the following components:      Result Value   Chloride 112 (*)    CO2 21 (*)    All other components within normal limits  ETHANOL - Abnormal; Notable for  the following components:   Alcohol, Ethyl (B) 138 (*)    All other components within normal limits  ACETAMINOPHEN LEVEL - Abnormal; Notable for the following components:   Acetaminophen (Tylenol), Serum <10 (*)    All other components within normal limits  SALICYLATE LEVEL - Abnormal; Notable for the following components:   Salicylate Lvl <7.0 (*)    All other components within normal limits  RESP PANEL BY RT-PCR (FLU A&B, COVID) ARPGX2  CBC     EKG  None   RADIOLOGY None   Official radiology report(s): No results found.   PROCEDURES:  Critical Care performed: Yes, see critical care procedure note(s)  CRITICAL CARE Performed by: Irean Hong   Total critical care time: 30 minutes  Critical care time was exclusive of separately billable procedures and treating other patients.  Critical care was necessary to treat or prevent imminent or life-threatening deterioration.  Critical care was  time spent personally by me on the following activities: development of treatment plan with patient and/or surrogate as well as nursing, discussions with consultants, evaluation of patient's response to treatment, examination of patient, obtaining history from patient or surrogate, ordering and performing treatments and interventions, ordering and review of laboratory studies, ordering and review of radiographic studies, pulse oximetry and re-evaluation of patient's condition.   Procedures   MEDICATIONS ORDERED IN ED: Medications  ziprasidone (GEODON) injection 20 mg (20 mg Intramuscular Given 10/30/21 0423)     IMPRESSION / MDM / ASSESSMENT AND PLAN / ED COURSE  I reviewed the triage vital signs and the nursing notes.                             26 year old schizophrenic who presents yelling, cursing, combative.  Unable to be verbally redirected.  Requires IM calming agent.  Will place patient under IVC for his and staff safety.  Psychiatric NP and TTS attempted to interview patient but he was too agitated.  Will reattempt in the morning.  I have personally reviewed patient's records and see that he was sent to [facility] on 10/17/2021.  Clinical Course as of 10/30/21 0623  Mon Oct 30, 2021  0518 Patient calm after administration of IM Geodon. [JS]  [id number] Laboratory results demonstrate elevated EtOH level.  Patient remains under IVC pending psychiatric evaluation and disposition. [JS]    Clinical Course User Index [JS] Irean Hong, MD     FINAL CLINICAL IMPRESSION(S) / ED DIAGNOSES   Final diagnoses:  Acute psychosis (HCC)  Schizophrenia, unspecified type (HCC)     Rx / DC Orders   ED Discharge Orders     None        Note:  This document was prepared using Dragon voice recognition software and may include unintentional dictation errors.   Irean Hong, MD 10/30/21 3354    Irean Hong, MD 10/30/21 [phone]

## 2021-10-30 NOTE — ED Notes (Addendum)
[employer] at bedside for assistance to get patient from wheelchair and into bed after medication administration. Pt continues to be verbally aggressive (mostly centered on his mother). States that his mother controls him and sends him to the hospital. Flight of ideas. Able to collect blood work and obtain VS. Unable to get COVID swab or change out into behavioral clothing at this time.

## 2021-10-30 NOTE — BH Assessment (Signed)
On approach this provider and TTS observed the patient was screaming, yelling and cursing at staff. He was actively being monitored by security. Ultimately he was medicated.  Patient was unable to be assessed due to aggressiveness and medicated state.

## 2021-10-30 NOTE — ED Notes (Signed)
IVC pending inpatient 

## 2021-10-30 NOTE — BH Assessment (Signed)
Adult MH  Referral information for Psychiatric Hospitalization faxed to:   [facility] ([phone]-or- [phone]),   [facility] ([phone]),   [facility] ([phone] -or- [phone]),   [place] ([phone]---[phone]---[phone]),   [place] ([phone] or [phone])   [place] ([phone] or [phone]),   [place] ([phone]) 

## 2021-10-30 NOTE — ED Notes (Signed)
Meal tray given 

## 2021-10-30 NOTE — ED Notes (Addendum)
Pt requesting ice water and given. 

## 2021-10-30 NOTE — ED Notes (Addendum)
Dressed out by this Pharmacist, community, EDT. Belongings include black pants, grey shorts, blue shirt and belt. Pt disorganized, manic, verbally aggressive. Refusing COVID swab. Refused repeat VS.

## 2021-10-30 NOTE — ED Triage Notes (Addendum)
BIB by [employer] from home. Mother called due to patient being agitated, drinking and combative with mother. Pt comes in via wheelchair and bilateral forensic cuffs with [employer] due to aggression. Screaming on arrival to ED, uncooperative with staff, threatening words and behaviors.

## 2021-10-30 NOTE — Consult Note (Signed)
[facility] Face-to-Face Psychiatry Consult   Reason for Consult:  agitation  Referring Physician:  EDP Patient Identification: Ronald [name] MRN:  [id number] Principal Diagnosis: Acute psychosis (HCC) Diagnosis:  Principal Problem:   Acute psychosis (HCC)   Total Time spent with patient: 30 minutes  Subjective:  "My mom is a tyrannical b----."  Ronald [name] is a 26 y.o. male patient admitted with psychosis.  HPI:  Patient has hx of substance-induced psychosis. He has not provided a urine sample at this point. However, he had BAL of 138 @ 0432 this morning. Interview @ 1130: Patient ids pacing. Laughing to himself inappropriately. Says "I'm a non-conformist." States he had an argument with his mother last evening. He lives with mom. He is very irritable; annoyed that he is asked "the same questions all the time."  Denies homicidal ideation, but says "Yes, I feel like I do not want to be alive."  Recommend inpatient psychiatric hospitalization.    Past Psychiatric History: substance-induced psychosis  Risk to Self:   Risk to Others:   Prior Inpatient Therapy:   Prior Outpatient Therapy:    Past Medical History:  Past Medical History:  Diagnosis Date   Asthma    History reviewed. No pertinent surgical history. Family History: No family history on file. Family Psychiatric  History: unknown Social History:  Social History   Substance and Sexual Activity  Alcohol Use Yes   Alcohol/week: 2.0 standard drinks   Types: 2 Cans of beer per week   Comment: every other week     Social History   Substance and Sexual Activity  Drug Use Yes   Types: Marijuana   Comment: weekly    Social History   Socioeconomic History   Marital status: Single    Spouse name: Not on file   Number of children: Not on file   Years of education: Not on file   Highest education level: Not on file  Occupational History   Not on file  Tobacco Use   Smoking status: Not on file   Smokeless tobacco:  Never  Vaping Use   Vaping Use: Never used  Substance and Sexual Activity   Alcohol use: Yes    Alcohol/week: 2.0 standard drinks    Types: 2 Cans of beer per week    Comment: every other week   Drug use: Yes    Types: Marijuana    Comment: weekly   Sexual activity: Yes    Partners: Female    Birth control/protection: Condom  Other Topics Concern   Not on file  Social History Narrative   ** Merged History Encounter **       Social Determinants of Health   Financial Resource Strain: Not on file  Food Insecurity: Not on file  Transportation Needs: Not on file  Physical Activity: Not on file  Stress: Not on file  Social Connections: Not on file   Additional Social History:    Allergies:  No Known Allergies  Labs:  Results for orders placed or performed during the hospital encounter of 10/30/21 (from the past 48 hour(s))  CBC     Status: None   Collection Time: 10/30/21  4:32 AM  Result Value Ref Range   WBC 7.8 4.0 - 10.5 K/uL   RBC 4.57 4.22 - 5.81 MIL/uL   Hemoglobin 14.1 13.0 - 17.0 g/dL   HCT 26.8 34.1 - 96.2 %   MCV 95.0 80.0 - 100.0 fL   MCH 30.9 26.0 - 34.0 pg  MCHC 32.5 30.0 - 36.0 g/dL   RDW 12.6 11.5 - 15.5 %   Platelets 306 150 - 400 K/uL   nRBC 0.0 0.0 - 0.2 %    Comment: Performed at [facility], [address] [place], [place] [address]  Comprehensive metabolic panel     Status: Abnormal   Collection Time: 10/30/21  4:32 AM  Result Value Ref Range   Sodium 143 135 - 145 mmol/L   Potassium 3.8 3.5 - 5.1 mmol/L   Chloride 112 (H) 98 - 111 mmol/L   CO2 21 (L) 22 - 32 mmol/L   Glucose, Bld 94 70 - 99 mg/dL    Comment: Glucose reference range applies only to samples taken after fasting for at least 8 hours.   BUN 10 6 - 20 mg/dL   Creatinine, Ser 0.94 0.61 - 1.24 mg/dL   Calcium 8.9 8.9 - 10.3 mg/dL   Total Protein 7.3 6.5 - 8.1 g/dL   Albumin 3.8 3.5 - 5.0 g/dL   AST 31 15 - 41 U/L   ALT 20 0 - 44 U/L   Alkaline Phosphatase 73 38 -  126 U/L   Total Bilirubin 0.3 0.3 - 1.2 mg/dL   GFR, Estimated >60 >60 mL/min    Comment: (NOTE) Calculated using the CKD-EPI Creatinine Equation (2021)    Anion gap 10 5 - 15    Comment: Performed at [facility], [address] [place], [place] [address]  Ethanol     Status: Abnormal   Collection Time: 10/30/21  4:32 AM  Result Value Ref Range   Alcohol, Ethyl (B) 138 (H) <10 mg/dL    Comment: (NOTE) Lowest detectable limit for serum alcohol is 10 mg/dL.  For medical purposes only. Performed at [facility], [address] [place], [place] [address]   Acetaminophen level     Status: Abnormal   Collection Time: 10/30/21  4:32 AM  Result Value Ref Range   Acetaminophen (Tylenol), Serum <10 (L) 10 - 30 ug/mL    Comment: (NOTE) Therapeutic concentrations vary significantly. A range of 10-30 ug/mL  may be an effective concentration for many patients. However, some  are best treated at concentrations outside of this range. Acetaminophen concentrations >150 ug/mL at 4 hours after ingestion  and >50 ug/mL at 12 hours after ingestion are often associated with  toxic reactions.  Performed at [facility], [address] [place], [place] [id number]   Salicylate level     Status: Abnormal   Collection Time: 10/30/21  4:32 AM  Result Value Ref Range   Salicylate Lvl [id number] (L) 7.0 - 30.0 mg/dL    Comment: Performed at [facility], [address] [place], [place] [address]    No current facility-administered medications for this encounter.   Current Outpatient Medications  Medication Sig Dispense Refill   benztropine (COGENTIN) 0.5 MG tablet Take 1 tablet (0.5 mg total) by mouth at bedtime. (Patient not taking: Reported on 01/29/2020) 30 tablet 0   risperiDONE (RISPERDAL M-TABS) 1 MG disintegrating tablet Take 1 tablet (1 mg total) by mouth 2 (two) times daily. (Patient not taking: Reported on 04/05/2021) 60 tablet 1     Musculoskeletal: Strength & Muscle Tone: within normal limits Gait & Station: normal Patient leans: N/A            Psychiatric Specialty Exam:  Presentation  General Appearance: Disheveled  Eye Contact:Fleeting  Speech:Clear and Coherent  Speech Volume:Increased  Handedness:Right   Mood and  Affect  Mood:Anxious; Dysphoric; Irritable  Affect:Congruent   Thought Process  Thought Processes:Disorganized  Descriptions of Associations:Loose  Orientation:Partial  Thought Content:Illogical  History of Schizophrenia/Schizoaffective disorder:No  Duration of Psychotic Symptoms:Less than six months  Hallucinations:No data recorded Ideas of Reference:None  Suicidal Thoughts:Suicidal Thoughts: -- (unable to fully assess)  Homicidal Thoughts:Homicidal Thoughts: No   Sensorium  Memory:Immediate Poor; Recent Poor; Remote Poor  Judgment:Impaired  Insight:Lacking   Executive Functions  Concentration:Poor  Attention Span:Poor  Recall:Poor  Fund of Knowledge:Poor  Language:Poor   Psychomotor Activity  Psychomotor Activity:Psychomotor Activity: Increased   Assets  Assets:Resilience   Sleep  Sleep:Sleep: Fair   Physical Exam: Physical Exam Vitals and nursing note reviewed.  HENT:     Head: Normocephalic.     Nose: No congestion or rhinorrhea.  Eyes:     General:        Right eye: No discharge.        Left eye: No discharge.  Cardiovascular:     Rate and Rhythm: Normal rate.  Pulmonary:     Effort: Pulmonary effort is normal.  Musculoskeletal:        General: Normal range of motion.     Cervical back: Normal range of motion.  Skin:    General: Skin is dry.  Neurological:     Mental Status: He is alert and oriented to person, place, and time.  Psychiatric:        Attention and Perception: He is inattentive.        Mood and Affect: Mood is anxious. Affect is labile.        Speech: Speech normal.        Thought Content:  Thought content is paranoid and delusional.        Cognition and Memory: Cognition is impaired.        Judgment: Judgment is impulsive.   Review of Systems  Psychiatric/Behavioral:  Positive for depression and substance abuse (history of). The patient is nervous/anxious.   All other systems reviewed and are negative. Blood pressure 116/74, pulse 79, temperature 97.6 F (36.4 C), temperature source Axillary, resp. rate 16, height 5\' 8"  (1.727 m), weight 74 kg, SpO2 98 %. Body mass index is 24.81 kg/m.  Treatment Plan Summary: Daily contact with patient to assess and evaluate symptoms and progress in treatment and Plan Admit to inpatient psychiatry  Disposition: Recommend psychiatric Inpatient admission when medically cleared.  Sherlon Handing, NP 10/30/2021 1:17 PM

## 2021-10-30 NOTE — ED Notes (Signed)
IVC/pending psych consult 

## 2021-10-30 NOTE — BH Assessment (Signed)
Comprehensive Clinical Assessment (CCA) Note  10/30/2021 Ronald [name] [id number]  Walden Field, 26 year old male who presents to [facility] ED involuntarily for treatment. Per triage note, BIB by BPD from home. Mother called due to patient being agitated, drinking and combative with mother. Pt comes in via wheelchair and bilateral forensic cuffs with BPD due to aggression. Screaming on arrival to ED, uncooperative with staff and threatening words and behaviors.   During TTS assessment pt presents alert and oriented x 4, restless but cooperative, and mood-congruent with affect. The pt does not appear to be responding to internal or external stimuli. Neither is the pt presenting with any delusional thinking. Pt verified the information provided to triage RN.   Pt identifies his main complaint to be that his mom keeps bringing him to the hospital. Patient reports he only had  of a drink and few cigarettes. Patient denies using any illicit substances. Patient presents irritable, agitated, and inappropriate laughing. During the interview, patient is pacing the floor and annoyed that he must answer questions. Pt denies current HI/AH/VH but states there are times when he feels like he does not want to be alive. Patient is angry with mom and reports they got into an argument last night. Patient was seen here at [facility] last month for similar presentation. Patient has hx of substances induced psychosis.      Per Sallye Ober, NP, pt is recommended for inpatient psychiatric admission.    Chief Complaint:  Chief Complaint  Patient presents with   Psychiatric Evaluation   Visit Diagnosis: Acute psychosis    CCA Screening, Triage and Referral (STR)  Patient Reported Information How did you hear about [place]? -- Mudlogger)  Referral name: No data recorded Referral phone number: No data recorded  Whom do you see for routine medical problems? No data recorded Practice/Facility Name: No data  recorded Practice/Facility Phone Number: No data recorded Name of Contact: No data recorded Contact Number: No data recorded Contact Fax Number: No data recorded Prescriber Name: No data recorded Prescriber Address (if known): No data recorded  What Is the Reason for Your Visit/Call Today? Per IVC paperwork, patient was brought to the ED after argument with mom.  How Long Has This Been Causing You Problems? <Week  What Do You Feel Would Help You the Most Today? Treatment for Depression or other mood problem; Medication(s)   Have You Recently Been in Any Inpatient Treatment (Hospital/Detox/Crisis Center/28-Day Program)? No data recorded Name/Location of Program/Hospital:No data recorded How Long Were You There? No data recorded When Were You Discharged? No data recorded  Have You Ever Received Services From [facility] Before? No data recorded Who Do You See at [facility] No data recorded  Have You Recently Had Any Thoughts About Hurting Yourself? Yes  Are You Planning to Commit Suicide/Harm Yourself At This time? No   Have you Recently Had Thoughts About Hurting Someone Karolee Ohs? No  Explanation: No data recorded  Have You Used Any Alcohol or Drugs in the Past 24 Hours? -- (UTA)  How Long Ago Did You Use Drugs or Alcohol? No data recorded What Did You Use and How Much? No data recorded  Do You Currently Have a Therapist/Psychiatrist? No  Name of Therapist/Psychiatrist: No data recorded  Have You Been Recently Discharged From Any Office Practice or Programs? No  Explanation of Discharge From Practice/Program: No data recorded    CCA Screening Triage Referral Assessment Type of Contact: Face-to-Face  Is this Initial or Reassessment? No data  recorded Date Telepsych consult ordered in [facility]:  No data recorded Time Telepsych consult ordered in [facility]:  No data recorded  Patient Reported Information Reviewed? No data recorded Patient Left Without Being Seen? No data  recorded Reason for Not Completing Assessment: Pt was not oriented; unable to answer assessment questions.   Collateral Involvement: Tristan SchroederMcDowell,Anesha (Mother)   [phone]   Does Patient Have a Court Appointed Legal Guardian? No data recorded Name and Contact of Legal Guardian: No data recorded If Minor and Not Living with Parent(s), Who has Custody? n/a  Is CPS involved or ever been involved? Never  Is APS involved or ever been involved? Never   Patient Determined To Be At Risk for Harm To Self or Others Based on Review of Patient Reported Information or Presenting Complaint? No  Method: No data recorded Availability of Means: No data recorded Intent: No data recorded Notification Required: No data recorded Additional Information for Danger to Others Potential: No data recorded Additional Comments for Danger to Others Potential: No data recorded Are There Guns or Other Weapons in Your Home? No data recorded Types of Guns/Weapons: No data recorded Are These Weapons Safely Secured?                            No data recorded Who Could Verify You Are Able To Have These Secured: No data recorded Do You Have any Outstanding Charges, Pending Court Dates, Parole/Probation? No data recorded Contacted To Inform of Risk of Harm To Self or Others: Family/Significant Other:   [facility]   Does Patient Present under Involuntary Commitment? Yes  IVC Papers Initial File Date: 10/29/21   [place]   Patient Currently Receiving the Following Services: Medication Management   Determination of Need: Emergent (2 hours)   Options For Referral: ED Visit; Inpatient Hospitalization; Medication Management     CCA Biopsychosocial Intake/Chief Complaint:  No data recorded Current Symptoms/Problems: No data recorded  Patient Reported Schizophrenia/Schizoaffective Diagnosis in Past: No   Strengths: Patient able to communicate and verbalize  needs.  Preferences: No data recorded Abilities: No data recorded  Type of Services Patient Feels are Needed: No data recorded  Initial Clinical Notes/Concerns: No data recorded  Mental Health Symptoms Depression:   None   Duration of Depressive symptoms: No data recorded  Mania:   Racing thoughts; Change in energy/activity   Anxiety:    Tension; Worrying; Irritability; Restlessness   Psychosis:   Grossly disorganized speech; Hallucinations   Duration of Psychotic symptoms:  Less than six months   Trauma:   N/A   Obsessions:   N/A   Compulsions:   None   Inattention:   None   Hyperactivity/Impulsivity:   Feeling of restlessness   Oppositional/Defiant Behaviors:   Easily annoyed   Emotional Irregularity:   Mood lability   Other Mood/Personality Symptoms:   n/a    Mental Status Exam Appearance and self-care  Stature:   Average   Weight:   Average weight   Clothing:   -- (In scrubs)   Grooming:   Neglected   Cosmetic use:   None   Posture/gait:   Normal   Motor activity:   Agitated; Restless   Sensorium  Attention:   Confused   Concentration:   Anxiety interferes; Focuses on irrelevancies   Orientation:   -- (not oriented)   Recall/memory:   Defective in Recent   Affect and Mood  Affect:  Anxious   Mood:   Anxious; Hypomania   Relating  Eye contact:   Avoided   Facial expression:   Responsive   Attitude toward examiner:   Resistant; Sarcastic; Irritable   Thought and Language  Speech flow:  Flight of Ideas   Thought content:   Appropriate to Mood and Circumstances   Preoccupation:   None   Hallucinations:   None   Organization:  No data recorded  [employer] of Knowledge:   Fair   Intelligence:   Average   Abstraction:   Overly abstract   Judgement:   Poor   Reality Testing:   Unaware   Insight:   Poor   Decision Making:   Confused   Social Functioning  Social  Maturity:   Irresponsible   Social Judgement:   Impropriety   Stress  Stressors:   Family conflict; Relationship; Work; Housing   Coping Ability:   Normal   Skill Deficits:   None   Supports:   Family     Religion:    Leisure/Recreation:    Exercise/Diet:     CCA Employment/Education Employment/Work Situation: Employment / Work Systems developer: Unemployed  Education: Education Is Patient Currently Attending School?: No   CCA Family/Childhood History Family and Relationship History: Family history Marital status: Single  Childhood History:     Child/Adolescent Assessment:     CCA Substance Use Alcohol/Drug Use: Alcohol / Drug Use Pain Medications: See MAR Prescriptions: See MAR Over the Counter: See MAR History of alcohol / drug use?: Yes Substance #1 Name of Substance 1: Alcohol                       ASAM's:  Six Dimensions of Multidimensional Assessment  Dimension 1:  Acute Intoxication and/or Withdrawal Potential:      Dimension 2:  Biomedical Conditions and Complications:      Dimension 3:  Emotional, Behavioral, or Cognitive Conditions and Complications:     Dimension 4:  Readiness to Change:     Dimension 5:  Relapse, Continued use, or Continued Problem Potential:     Dimension 6:  Recovery/Living Environment:     ASAM Severity Score:    ASAM Recommended Level of Treatment:     Substance use Disorder (SUD)    Recommendations for Services/Supports/Treatments:    DSM5 Diagnoses: Patient Active Problem List   Diagnosis Date Noted   Abnormal behavior    Acute psychosis (HCC) 10/02/2021   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

## 2021-10-30 NOTE — BH Assessment (Signed)
This Clinical research associate attempted to assess patient with Psyc NP but patient arrived agitated and aggressive, ED staff provided patient with medications

## 2021-10-30 NOTE — ED Notes (Addendum)
Refusing meds, refusing COVID swab.

## 2021-10-31 LAB — RESP PANEL BY RT-PCR (FLU A&B, COVID) ARPGX2
Influenza A by PCR: NEGATIVE
Influenza B by PCR: NEGATIVE
SARS Coronavirus 2 by RT PCR: NEGATIVE

## 2021-10-31 NOTE — ED Notes (Signed)
IVC/  PENDING  PLACEMENT 

## 2021-10-31 NOTE — ED Notes (Signed)
Meal given to pt. Patient resting at this time.

## 2021-10-31 NOTE — ED Notes (Signed)
BEHAVIORAL HEALTH ROUNDING Patient sleeping: No. Patient alert and oriented: yes Behavior appropriate: Yes.  ; If no, describe:  Nutrition and fluids offered: yes Toileting and hygiene offered: Yes  Sitter present: q15 minute observations and security monitoring Law enforcement present: Yes  ODS  Pt taken to room 3 in BHU. Pt shown around area and rules explained. No further questions at this time

## 2021-10-31 NOTE — ED Notes (Signed)
Pt cooperative with Covid swab.

## 2021-10-31 NOTE — ED Notes (Signed)
Pt given dinner tray and beverage  

## 2021-10-31 NOTE — ED Notes (Signed)
Pt given snack. 

## 2021-11-01 ENCOUNTER — Encounter: Payer: Self-pay | Admitting: Psychiatry

## 2021-11-01 ENCOUNTER — Inpatient Hospital Stay
Admission: AD | Admit: 2021-11-01 | Discharge: 2021-11-06 | DRG: 885 | Disposition: A | Discharge status: Home / Self Care | Payer: No Typology Code available for payment source | Source: Intra-hospital | Attending: Psychiatry | Admitting: Psychiatry

## 2021-11-01 ENCOUNTER — Other Ambulatory Visit: Payer: Self-pay

## 2021-11-01 DIAGNOSIS — F209 Schizophrenia, unspecified: Secondary | ICD-10-CM | POA: Diagnosis present

## 2021-11-01 DIAGNOSIS — F29 Unspecified psychosis not due to a substance or known physiological condition: Secondary | ICD-10-CM | POA: Diagnosis present

## 2021-11-01 DIAGNOSIS — Z91199 Patient's noncompliance with other medical treatment and regimen due to unspecified reason: Secondary | ICD-10-CM

## 2021-11-01 DIAGNOSIS — F203 Undifferentiated schizophrenia: Secondary | ICD-10-CM | POA: Diagnosis not present

## 2021-11-01 DIAGNOSIS — F17210 Nicotine dependence, cigarettes, uncomplicated: Secondary | ICD-10-CM | POA: Diagnosis present

## 2021-11-01 DIAGNOSIS — F1010 Alcohol abuse, uncomplicated: Secondary | ICD-10-CM | POA: Diagnosis present

## 2021-11-01 DIAGNOSIS — F23 Brief psychotic disorder: Secondary | ICD-10-CM | POA: Diagnosis not present

## 2021-11-01 MED ORDER — HYDROXYZINE HCL 50 MG PO TABS: 50.0000 mg | ORAL_TABLET | Freq: Three times a day (TID) | ORAL | Status: DC | PRN

## 2021-11-01 MED ORDER — ALUM & MAG HYDROXIDE-SIMETH 200-200-20 MG/5ML PO SUSP: 30.0000 mL | ORAL | Status: DC | PRN

## 2021-11-01 MED ORDER — MAGNESIUM HYDROXIDE 400 MG/5ML PO SUSP
30.0000 mL | Freq: Every day | ORAL | Status: DC | PRN
Start: 2021-11-01 — End: 2021-11-06

## 2021-11-01 MED ORDER — ACETAMINOPHEN 325 MG PO TABS
650.0000 mg | ORAL_TABLET | Freq: Four times a day (QID) | ORAL | Status: DC | PRN
Start: 2021-11-01 — End: 2021-11-06

## 2021-11-01 MED ORDER — NICOTINE POLACRILEX 2 MG MT GUM
2.0000 mg | CHEWING_GUM | OROMUCOSAL | Status: DC | PRN
  Administered 2021-11-01 – 2021-11-03 (×4): 2 mg via ORAL
  Filled 2021-11-01 (×4): qty 1

## 2021-11-01 MED ORDER — BENZTROPINE MESYLATE 1 MG PO TABS
0.5000 mg | ORAL_TABLET | Freq: Every day | ORAL | Status: DC
  Administered 2021-11-01 – 2021-11-05 (×5): 0.5 mg via ORAL
  Filled 2021-11-01 (×6): qty 1

## 2021-11-01 MED ORDER — RISPERIDONE 1 MG PO TBDP
1.0000 mg | ORAL_TABLET | Freq: Two times a day (BID) | ORAL | Status: DC
  Administered 2021-11-01 – 2021-11-03 (×4): 1 mg via ORAL
  Filled 2021-11-01 (×4): qty 1

## 2021-11-01 NOTE — ED Notes (Signed)
Breakfast and beverage served.  

## 2021-11-01 NOTE — ED Notes (Addendum)
Pt given lunch tray.

## 2021-11-01 NOTE — Progress Notes (Signed)
Admission Note:   Report was received from [place], [place] on a 26 year-old male who presents IVC in no acute distress for treatment of Alcohol-induced Psychosis and Depression. Patient appears restless and anxious. Patient was calm and cooperative with most of the admission process. Patient is extremely restless and tangential in his thought processes. Patient presents with passive SI and contracts for safety upon admission. Patient stated he feels this way because "every time I wake up, I'm here". Patient is preoccupied with the cops his mother and blaming her for always putting him in psych facilities, "I [facility]". Patient stated that he was at [facility] for a week or two. Patient endorsed depression stating "my family situation, my poverty situation". When asked about anxiety, patient initially denied signs/symptoms, but then stated "except my being homeless". Patient denies HI/AVH and pain. Patient stated that he lives with his Mom and Kateri Mc, at his Uncle's house, but is unsure if he will be able to return. He also reports that he doesn't have a support system, but he also stated that his Mom is the one who knows all about him, and is also the one who puts him in psych institutions. Patient is unemployed and states that his goals for treatment are: "getting toxic people out of my life" and "becoming independent and getting away from my Mom". Patient has a past medical history of Asthma. Skin was assessed with Ivonne Andrew, RN and found to be clear of any abnormal marks apart from a scar/scratch marks on his upper left arm/shoulder area, a healing blister on the inside of his right foot, and dry, callused feet. Patient searched and no contraband found and unit policies explained and understanding verbalized. Patient refused to sign consents. Food and fluids offered, and both accepted. Patient had no additional questions or concerns to voice to this Clinical research associate. Patient remains safe on the unit.

## 2021-11-01 NOTE — Plan of Care (Signed)
New admission.  Problem: Education: Goal: Knowledge of [facility] Education information/materials will improve Outcome: Not Progressing Goal: Emotional status will improve Outcome: Not Progressing Goal: Mental status will improve Outcome: Not Progressing Goal: Verbalization of understanding the information provided will improve Outcome: Not Progressing   Problem: Safety: Goal: Periods of time without injury will increase Outcome: Not Progressing   Problem: Coping: Goal: Coping ability will improve Outcome: Not Progressing   Problem: Self-Concept: Goal: Level of anxiety will decrease Outcome: Not Progressing   Problem: Education: Goal: Will be free of psychotic symptoms Outcome: Not Progressing Goal: Knowledge of the prescribed therapeutic regimen will improve Outcome: Not Progressing

## 2021-11-01 NOTE — Progress Notes (Signed)
Has been isolative to his room this evening. Did come out for snack and medication. Received his QHS medication and tolerated without incident. Endorses passive si with no plan and depression which he rated 5/10 at this encounter.  He denies hi and avh but appears to be preoccupied and at times responding to internal stimuli. Continue to blame his mother for the reason he is here. No insight on needing to be hospitalized to address his depression and si. Will continue to monitor with q 15 minute safety checks.  Encourage him to seek staff with any questions or concerns and to continue to build rapport.        Rosalie Doctor, LPN

## 2021-11-01 NOTE — ED Notes (Signed)
Patient ate 100% of breakfast and beverage.  

## 2021-11-01 NOTE — Plan of Care (Signed)
  Problem: Education: Goal: Knowledge of [facility] Education information/materials will improve Outcome: Not Progressing Goal: Emotional status will improve Outcome: Not Progressing Goal: Mental status will improve Outcome: Not Progressing Goal: Verbalization of understanding the information provided will improve Outcome: Not Progressing   Problem: Safety: Goal: Periods of time without injury will increase Outcome: Not Progressing   Problem: Coping: Goal: Coping ability will improve Outcome: Not Progressing   Problem: Self-Concept: Goal: Level of anxiety will decrease Outcome: Not Progressing   Problem: Education: Goal: Will be free of psychotic symptoms Outcome: Not Progressing Goal: Knowledge of the prescribed therapeutic regimen will improve Outcome: Not Progressing

## 2021-11-01 NOTE — ED Notes (Signed)
EVS offering to clean pt room, pt refusing at this time.

## 2021-11-01 NOTE — Tx Team (Signed)
Initial Treatment Plan 11/01/2021 5:15 PM Samar Madilyn Fireman HBZ:[id number]    PATIENT STRESSORS: Financial difficulties   Marital or family conflict   Substance abuse     PATIENT STRENGTHS: Active sense of humor  General fund of knowledge    PATIENT IDENTIFIED PROBLEMS: Acute Psychosis  Depression  Substance abuse  Unemployed               DISCHARGE CRITERIA:  Ability to meet basic life and health needs Improved stabilization in mood, thinking, and/or behavior Need for constant or close observation no longer present Reduction of life-threatening or endangering symptoms to within safe limits Verbal commitment to aftercare and medication compliance  PRELIMINARY DISCHARGE PLAN: Outpatient therapy Return to previous living arrangement  PATIENT/FAMILY INVOLVEMENT: This treatment plan has been presented to and reviewed with the patient, Walden Field. The patient has been given the opportunity to ask questions and make suggestions.  Keyuna Cuthrell, RN 11/01/2021, 5:15 PM

## 2021-11-01 NOTE — Consult Note (Signed)
[facility] ED Progress Note  11/01/2021 9:00 AM Ronald [name]  MRN:  [id number]   Method of visit?: Face to Face   Subjective:  "Of course I'm depressed, that's why I drink."  Patient has not required any IM medications. He still endorses marked anger towards his mother, whom he blames his troubles on. He denies active suicidal ideations, endorses passive thoughts of "not wanting to be alive." Patient laughs inappropriately at times. Writer talked to him about staying on medications for depression and away from alcohol and illicit substances. He admits to alcohol use, denies amphetamine use, despite UDS being positive for amphetamines. At this time he recognizes the need for mental health treatment.   Principal Problem: Acute psychosis (HCC) Diagnosis:  Principal Problem:   Acute psychosis (HCC)  Total Time spent with patient: 15 minutes  Past Psychiatric History: see prior  Past Medical History:  Past Medical History:  Diagnosis Date   Asthma    History reviewed. No pertinent surgical history. Family History: No family history on file. Family Psychiatric  History: see prior Social History:  Social History   Substance and Sexual Activity  Alcohol Use Yes   Alcohol/week: 2.0 standard drinks   Types: 2 Cans of beer per week   Comment: every other week     Social History   Substance and Sexual Activity  Drug Use Yes   Types: Marijuana   Comment: weekly    Social History   Socioeconomic History   Marital status: Single    Spouse name: Not on file   Number of children: Not on file   Years of education: Not on file   Highest education level: Not on file  Occupational History   Not on file  Tobacco Use   Smoking status: Not on file   Smokeless tobacco: Never  Vaping Use   Vaping Use: Never used  Substance and Sexual Activity   Alcohol use: Yes    Alcohol/week: 2.0 standard drinks    Types: 2 Cans of beer per week    Comment: every other week   Drug use: Yes     Types: Marijuana    Comment: weekly   Sexual activity: Yes    Partners: Female    Birth control/protection: Condom  Other Topics Concern   Not on file  Social History Narrative   ** Merged History Encounter **       Social Determinants of Health   Financial Resource Strain: Not on file  Food Insecurity: Not on file  Transportation Needs: Not on file  Physical Activity: Not on file  Stress: Not on file  Social Connections: Not on file    Sleep: Good  Appetite:  Good  Current Medications: Current Facility-Administered Medications  Medication Dose Route Frequency Provider Last Rate Last Admin   benztropine (COGENTIN) tablet 0.5 mg  0.5 mg Oral QHS Gabriel Cirri F, NP   0.5 mg at 10/31/21 2115   hydrOXYzine (ATARAX) tablet 50 mg  50 mg Oral TID PRN Vanetta Mulders, NP       risperiDONE (RISPERDAL M-TABS) disintegrating tablet 1 mg  1 mg Oral BID Gabriel Cirri F, NP   1 mg at 10/31/21 2115   Current Outpatient Medications  Medication Sig Dispense Refill   benztropine (COGENTIN) 0.5 MG tablet Take 1 tablet (0.5 mg total) by mouth at bedtime. (Patient not taking: Reported on 01/29/2020) 30 tablet 0   risperiDONE (RISPERDAL M-TABS) 1 MG disintegrating tablet Take 1 tablet (1 mg total)  by mouth 2 (two) times daily. (Patient not taking: Reported on 04/05/2021) 60 tablet 1    Lab Results:  Results for orders placed or performed during the hospital encounter of 10/30/21 (from the past 48 hour(s))  Resp Panel by RT-PCR (Flu A&B, Covid) Anterior Nasal Swab     Status: None   Collection Time: 10/30/21  4:39 PM   Specimen: Anterior Nasal Swab  Result Value Ref Range   SARS Coronavirus 2 by RT PCR NEGATIVE NEGATIVE    Comment: (NOTE) SARS-CoV-2 target nucleic acids are NOT DETECTED.  The SARS-CoV-2 RNA is generally detectable in upper respiratory specimens during the acute phase of infection. The lowest concentration of SARS-CoV-2 viral copies this assay can detect is 138  copies/mL. A negative result does not preclude SARS-Cov-2 infection and should not be used as the sole basis for treatment or other patient management decisions. A negative result may occur with  improper specimen collection/handling, submission of specimen other than nasopharyngeal swab, presence of viral mutation(s) within the areas targeted by this assay, and inadequate number of viral copies(<138 copies/mL). A negative result must be combined with clinical observations, patient history, and epidemiological information. The expected result is Negative.  Fact Sheet for Patients:  BloggerCourse.com  Fact Sheet for Healthcare Providers:  SeriousBroker.it  This test is no t yet approved or cleared by the [place] FDA and  has been authorized for detection and/or diagnosis of SARS-CoV-2 by FDA under an Emergency Use Authorization (EUA). This EUA will remain  in effect (meaning this test can be used) for the duration of the COVID-19 declaration under Section 564(b)(1) of the Act, 21 U.S.C.section 360bbb-3(b)(1), unless the authorization is terminated  or revoked sooner.       Influenza A by PCR NEGATIVE NEGATIVE   Influenza B by PCR NEGATIVE NEGATIVE    Comment: (NOTE) The Xpert Xpress SARS-CoV-2/FLU/RSV plus assay is intended as an aid in the diagnosis of influenza from Nasopharyngeal swab specimens and should not be used as a sole basis for treatment. Nasal washings and aspirates are unacceptable for Xpert Xpress SARS-CoV-2/FLU/RSV testing.  Fact Sheet for Patients: BloggerCourse.com  Fact Sheet for Healthcare Providers: SeriousBroker.it  This test is not yet approved or cleared by the [place] FDA and has been authorized for detection and/or diagnosis of SARS-CoV-2 by FDA under an Emergency Use Authorization (EUA). This EUA will remain in effect (meaning this test  can be used) for the duration of the COVID-19 declaration under Section 564(b)(1) of the Act, 21 U.S.C. section 360bbb-3(b)(1), unless the authorization is terminated or revoked.  Performed at [facility], [address] [place], [place] [address]   Urine Drug Screen, Qualitative [facility] only)     Status: Abnormal   Collection Time: 10/30/21  6:31 PM  Result Value Ref Range   Tricyclic, Ur Screen NONE DETECTED NONE DETECTED   Amphetamines, Ur Screen POSITIVE (A) NONE DETECTED   MDMA (Ecstasy)Ur Screen NONE DETECTED NONE DETECTED   Cocaine Metabolite,Ur [place] NONE DETECTED NONE DETECTED   Opiate, Ur Screen NONE DETECTED NONE DETECTED   Phencyclidine (PCP) Ur S NONE DETECTED NONE DETECTED   Cannabinoid 50 Ng, Ur  POSITIVE (A) NONE DETECTED   Barbiturates, Ur Screen NONE DETECTED NONE DETECTED   Benzodiazepine, Ur Scrn NONE DETECTED NONE DETECTED   Methadone Scn, Ur NONE DETECTED NONE DETECTED    Comment: (NOTE) Tricyclics + metabolites, urine    Cutoff 1000 ng/mL Amphetamines + metabolites, urine  Cutoff 1000 ng/mL MDMA (Ecstasy),  urine              Cutoff 500 ng/mL Cocaine Metabolite, urine          Cutoff 300 ng/mL Opiate + metabolites, urine        Cutoff 300 ng/mL Phencyclidine (PCP), urine         Cutoff 25 ng/mL Cannabinoid, urine                 Cutoff 50 ng/mL Barbiturates + metabolites, urine  Cutoff 200 ng/mL Benzodiazepine, urine              Cutoff 200 ng/mL Methadone, urine                   Cutoff 300 ng/mL  The urine drug screen provides only a preliminary, unconfirmed analytical test result and should not be used for non-medical purposes. Clinical consideration and professional judgment should be applied to any positive drug screen result due to possible interfering substances. A more specific alternate chemical method must be used in order to obtain a confirmed analytical result. Gas chromatography / mass spectrometry (GC/MS) is the preferred confirm  atory method. Performed at [facility], [address] [place], [place] [address]     Blood Alcohol level:  Lab Results  Component Value Date   ETH 138 (H) 10/30/2021   ETH <10 10/17/2021    Physical Findings: AIMS:  , ,  ,  ,    CIWA:    COWS:     Musculoskeletal: Strength & Muscle Tone: within normal limits Gait & Station: normal Patient leans: N/A  Psychiatric Specialty Exam:  Presentation  General Appearance: Appropriate for Environment  Eye Contact:Fair  Speech:Clear and Coherent  Speech Volume:Normal  Handedness:Right   Mood and Affect  Mood:Depressed  Affect:Appropriate; Congruent   Thought Process  Thought Processes:Coherent  Descriptions of Associations:Intact  Orientation:Full (Time, Place and Person)  Thought Content:WDL  History of Schizophrenia/Schizoaffective disorder:No  Duration of Psychotic Symptoms:Less than six months  Hallucinations:Hallucinations: None  Ideas of Reference:None  Suicidal Thoughts:Suicidal Thoughts: Yes, Passive SI Passive Intent and/or Plan: Without Intent; Without Plan  Homicidal Thoughts:Homicidal Thoughts: No   Sensorium  Memory:Immediate Fair; Recent Fair  Judgment:Fair  Insight:Fair   Executive Functions  Concentration:Fair  Attention Span:Fair  Recall:Fair  Fund of Knowledge:Fair  Language:Poor   Psychomotor Activity  Psychomotor Activity:Psychomotor Activity: Normal  Assets  Assets:Desire for Improvement; Financial Resources/Insurance; Housing; Resilience; Social Support   Sleep  Sleep:Sleep: Fair   Physical Exam: Physical Exam Vitals and nursing note reviewed.  HENT:     Head: Normocephalic.  Eyes:     General:        Right eye: No discharge.        Left eye: No discharge.  Cardiovascular:     Rate and Rhythm: Normal rate.  Pulmonary:     Effort: Pulmonary effort is normal.  Musculoskeletal:        General: Normal range of motion.     Cervical back:  Normal range of motion.  Skin:    General: Skin is dry.  Neurological:     Mental Status: He is oriented to person, place, and time.  Psychiatric:        Behavior: Behavior normal.   Review of Systems  Psychiatric/Behavioral:  Positive for depression and substance abuse. Negative for suicidal ideas. The patient is nervous/anxious.   All other systems reviewed and are negative. Blood pressure 112/70, pulse 86, temperature 98.3 F (36.8 C),  temperature source Oral, resp. rate 16, height 5\' 8"  (1.727 m), weight 74 kg, SpO2 100 %. Body mass index is 24.81 kg/m.  Treatment Plan Summary: Plan Admit to inpatient psychiatry  , NP 11/01/2021, 9:00 AM

## 2021-11-01 NOTE — ED Notes (Signed)
Report called to BMU Adult nurse )

## 2021-11-01 NOTE — BH Assessment (Signed)
Patient is to be admitted to [facility] BMU today 11/01/21 at 2:30pm by Dr. Toni Amend.  Attending Physician will be Dr.  Toni Amend .   Patient has been assigned to room 307, by [facility] Charge Nurse Laquisha Northcraft.    ER staff is aware of the admission: Misty Stanley, ER Secretary   Dr. Vicente Males, ER MD  Toniann Fail, Patient's Nurse  Sue Lush, Patient Access.

## 2021-11-02 DIAGNOSIS — F209 Schizophrenia, unspecified: Principal | ICD-10-CM

## 2021-11-02 DIAGNOSIS — F203 Undifferentiated schizophrenia: Secondary | ICD-10-CM

## 2021-11-02 LAB — LIPID PANEL
Cholesterol: 141 mg/dL (ref 0–200)
HDL: 69 mg/dL (ref >40)
LDL Cholesterol: 53 mg/dL (ref 0–99)
Total CHOL/HDL Ratio: 2 RATIO
Triglycerides: 93 mg/dL (ref <150)
VLDL: 19 mg/dL (ref 0–40)

## 2021-11-02 LAB — HEMOGLOBIN A1C
Hgb A1c MFr Bld: 5.1 % (ref 4.8–5.6)
Mean Plasma Glucose: 99.67 mg/dL

## 2021-11-02 MED ORDER — TRAZODONE HCL 50 MG PO TABS
50.0000 mg | ORAL_TABLET | Freq: Every evening | ORAL | Status: DC | PRN
  Administered 2021-11-02 – 2021-11-05 (×4): 50 mg via ORAL
  Filled 2021-11-02 (×4): qty 1

## 2021-11-02 NOTE — [facility] Suicide Risk Assessment (Signed)
[facility] INPATIENT:  Family/Significant Other Suicide Prevention Education  Suicide Prevention Education:  Patient Refusal for Family/Significant Other Suicide Prevention Education: The patient Ronald [name] has refused to provide written consent for family/significant other to be provided Family/Significant Other Suicide Prevention Education during admission and/or prior to discharge.  Physician notified.  SPE completed with pt, as pt refused to consent to family contact. SPI pamphlet provided to pt and pt was encouraged to share information with support network, ask questions, and talk about any concerns relating to SPE. Pt denies access to guns/firearms and verbalized understanding of information provided. Mobile Crisis information also provided to pt.  Glenis Smoker 11/02/2021, 3:44 PM

## 2021-11-02 NOTE — Progress Notes (Addendum)
D: Patient alert and oriented. Patient denies pain. Patient endorses anxiety and depression stating that "its not as bad as you think it is. Patient denies SI/HI/AVH. Patient isolative to room during shift with exception to coming out for meals and medication. Patient encouraged to go to group tomorrow.  Patient observed on the phone at the end of shift, then laughing to self and responding to internal stimuli  A: Scheduled medications administered to patient, per MD orders.  Support and encouragement provided to patient.  Q15 minute safety checks maintained.   R: Patient compliant with medication administration and treatment plan. No adverse drug reactions noted. Patient remains safe on the unit at this time.

## 2021-11-02 NOTE — Progress Notes (Signed)
Recreation Therapy Notes  Date: 11/02/2021   Time: 10:45am   Location: Court yard     Behavioral response: N/A   Intervention Topic: Decision Making     Discussion/Intervention: Patient refused to attend group.    Clinical Observations/Feedback:  Patient refused to attend group.    Zamoria Boss LRT/CTRS          Daishon Chui 11/02/2021 10:54 AM

## 2021-11-02 NOTE — H&P (Signed)
Psychiatric Admission Assessment Adult  Patient Identification: Ronald [name] MRN:  [id number] Date of Evaluation:  11/02/2021 Chief Complaint:  Psychosis [facility]) [F29] Principal Diagnosis: Schizophrenia ([place]) Diagnosis:  Principal Problem:   Schizophrenia ([place]) Active Problems:   Alcohol abuse   Psychosis ([place])  History of Present Illness: Patient seen and chart reviewed.  26 year old man known to our service who came to the emergency room via police from home.  Mother reported that he had been agitated and having bizarre behavior at home.  Patient was described in the emergency room as being belligerent and hostile towards staff such that it was difficult to assess him at first.  Patient says he has been drinking recently but then says he only had 2 4Locos that day and thought that he was no longer intoxicated.  He admits however he has also been abusing methamphetamine.  Patient has partial insight knowing that perhaps his behavior was a problem but minimizing it now.  He presents on interview today as being goofy with a somewhat labile affect lots of inappropriate laughter poor attention.  Indicates that he has had hallucinations recently.  Denies suicidal or homicidal ideation.  Admits noncompliance with prescribed medicine. Associated Signs/Symptoms: Depression Symptoms:  impaired memory, Duration of Depression Symptoms: No data recorded (Hypo) Manic Symptoms:  Flight of Ideas, Impulsivity, Irritable Mood, Labiality of Mood, Anxiety Symptoms:   Denies any Psychotic Symptoms:  Hallucinations: Auditory Paranoia, PTSD Symptoms: Negative Total Time spent with patient: 1 hour  Past Psychiatric History: Patient has a history of multiple visits to the hospital and emergency room with similar symptoms.  Psychotic symptoms clearly much worse when abusing methamphetamine although it seems like he may continue to be symptomatic even when he is sobered up.  He has been treated with Risperdal in  the past.  Does not follow up very well outpatient.  No history of suicide attempts.  Is the patient at risk to self? No.  Has the patient been a risk to self in the past 6 months? No.  Has the patient been a risk to self within the distant past? No.  Is the patient a risk to others? Yes.    Has the patient been a risk to others in the past 6 months? Yes.    Has the patient been a risk to others within the distant past? Yes.     Prior Inpatient Therapy:   Prior Outpatient Therapy:    Alcohol Screening: 1. How often do you have a drink containing alcohol?: 2 to 4 times a month 2. How many drinks containing alcohol do you have on a typical day when you are drinking?: 3 or 4 3. How often do you have six or more drinks on one occasion?: Never AUDIT-C Score: 3 4. How often during the last year have you found that you were not able to stop drinking once you had started?: Never 5. How often during the last year have you failed to do what was normally expected from you because of drinking?: Never 6. How often during the last year have you needed a first drink in the morning to get yourself going after a heavy drinking session?: Never 7. How often during the last year have you had a feeling of guilt of remorse after drinking?: Never 8. How often during the last year have you been unable to remember what happened the night before because you had been drinking?: Never 9. Have you or someone else been injured as a result of your  drinking?: No 10. Has a relative or friend or a doctor or another health worker been concerned about your drinking or suggested you cut down?: No Alcohol Use Disorder Identification Test Final Score (AUDIT): 3 Substance Abuse History in the last 12 months:  Yes.   Consequences of Substance Abuse: Multiple substances of abuse including amphetamine and cannabis alcohol all worsening his psychosis and behavior and limiting his ability to function Previous Psychotropic  Medications: Yes  Psychological Evaluations: Yes  Past Medical History:  Past Medical History:  Diagnosis Date   Asthma    History reviewed. No pertinent surgical history. Family History: History reviewed. No pertinent family history. Family Psychiatric  History: None reported Tobacco Screening:   Social History:  Social History   Substance and Sexual Activity  Alcohol Use Yes   Alcohol/week: 2.0 standard drinks of alcohol   Types: 2 Cans of beer per week   Comment: every other week     Social History   Substance and Sexual Activity  Drug Use Yes   Types: Marijuana   Comment: weekly    Additional Social History: Marital status: Single Are you sexually active?: No What is your sexual orientation?: Bisexual Has your sexual activity been affected by drugs, alcohol, medication, or emotional stress?: N/A Does patient have children?: No                         Allergies:  No Known Allergies Lab Results: No results found for this or any previous visit (from the past 72 hour(s)).  Blood Alcohol level:  Lab Results  Component Value Date   ETH 138 (H) 10/30/2021   ETH <10 [id number]    Metabolic Disorder Labs:  No results found for: "HGBA1C", "MPG" Lab Results  Component Value Date   PROLACTIN 22.8 (H) 05/08/2019   Lab Results  Component Value Date   CHOL 161 05/08/2019   TRIG 45 05/08/2019   HDL 96 05/08/2019   CHOLHDL 1.7 05/08/2019   VLDL 9 05/08/2019   LDLCALC 56 05/08/2019    Current Medications: Current Facility-Administered Medications  Medication Dose Route Frequency Provider Last Rate Last Admin   acetaminophen (TYLENOL) tablet 650 mg  650 mg Oral Q6H PRN Sherlon Handing, NP       alum & mag hydroxide-simeth (MAALOX/MYLANTA) 200-200-20 MG/5ML suspension 30 mL  30 mL Oral Q4H PRN Waldon Merl F, NP       benztropine (COGENTIN) tablet 0.5 mg  0.5 mg Oral QHS Waldon Merl F, NP   0.5 mg at 11/01/21 2136   hydrOXYzine (ATARAX) tablet  50 mg  50 mg Oral TID PRN Sherlon Handing, NP       magnesium hydroxide (MILK OF MAGNESIA) suspension 30 mL  30 mL Oral Daily PRN Waldon Merl F, NP       nicotine polacrilex (NICORETTE) gum 2 mg  2 mg Oral PRN Mayerly Kaman T, MD   2 mg at 11/02/21 [id number]   risperiDONE (RISPERDAL M-TABS) disintegrating tablet 1 mg  1 mg Oral BID Waldon Merl F, NP   1 mg at 11/02/21 0825   PTA Medications: Medications Prior to Admission  Medication Sig Dispense Refill Last Dose   benztropine (COGENTIN) 0.5 MG tablet Take 1 tablet (0.5 mg total) by mouth at bedtime. (Patient not taking: Reported on 01/29/2020) 30 tablet 0    risperiDONE (RISPERDAL M-TABS) 1 MG disintegrating tablet Take 1 tablet (1 mg total) by mouth 2 (two) times daily. (Patient  not taking: Reported on 04/05/2021) 60 tablet 1     Musculoskeletal: Strength & Muscle Tone: within normal limits Gait & Station: normal Patient leans: N/A            Psychiatric Specialty Exam:  Presentation  General Appearance: Appropriate for Environment  Eye Contact:Fair  Speech:Clear and Coherent  Speech Volume:Normal  Handedness:Right   Mood and Affect  Mood:Depressed  Affect:Appropriate; Congruent   Thought Process  Thought Processes:Coherent  Duration of Psychotic Symptoms: Less than six months  Past Diagnosis of Schizophrenia or Psychoactive disorder: No  Descriptions of Associations:Intact  Orientation:Full (Time, Place and Person)  Thought Content:WDL  Hallucinations:Hallucinations: None  Ideas of Reference:None  Suicidal Thoughts:Suicidal Thoughts: Yes, Passive SI Passive Intent and/or Plan: Without Intent; Without Plan  Homicidal Thoughts:Homicidal Thoughts: No   Sensorium  Memory:Immediate Fair; Recent Fair  Judgment:Fair  Insight:Fair   Executive Functions  Concentration:Fair  Attention Span:Fair  [place]  Language:Poor   Psychomotor Activity   Psychomotor Activity:Psychomotor Activity: Normal   Assets  Assets:Desire for Improvement; Financial Resources/Insurance; Housing; Resilience; Social Support   Sleep  Sleep:Sleep: Fair    Physical Exam: Physical Exam Vitals and nursing note reviewed.  Constitutional:      Appearance: Normal appearance.  HENT:     Head: Normocephalic and atraumatic.     Mouth/Throat:     Pharynx: Oropharynx is clear.  Eyes:     Pupils: Pupils are equal, round, and reactive to light.  Cardiovascular:     Rate and Rhythm: Normal rate and regular rhythm.  Pulmonary:     Effort: Pulmonary effort is normal.     Breath sounds: Normal breath sounds.  Abdominal:     General: Abdomen is flat.     Palpations: Abdomen is soft.  Musculoskeletal:        General: Normal range of motion.  Skin:    General: Skin is warm and dry.  Neurological:     General: No focal deficit present.     Mental Status: He is alert. Mental status is at baseline.  Psychiatric:        Attention and Perception: He is inattentive.        Mood and Affect: Mood normal. Affect is labile and inappropriate.        Speech: Speech is tangential.        Behavior: Behavior is cooperative.        Thought Content: Thought content is paranoid. Thought content does not include homicidal or suicidal ideation.        Cognition and Memory: Memory is impaired.        Judgment: Judgment is impulsive.    Review of Systems  Constitutional: Negative.   HENT: Negative.    Eyes: Negative.   Respiratory: Negative.    Cardiovascular: Negative.   Gastrointestinal: Negative.   Musculoskeletal: Negative.   Skin: Negative.   Neurological: Negative.   Psychiatric/Behavioral:  Positive for hallucinations and substance abuse. Negative for depression and suicidal ideas. The patient is nervous/anxious and has insomnia.    Blood pressure 102/64, pulse 91, temperature 99.3 F (37.4 C), temperature source Oral, resp. rate 18, height 5\' 11"  (1.803  m), weight 64.4 kg, SpO2 100 %. Body mass index is 19.8 kg/m.  Treatment Plan Summary: Medication management and Plan continue modest dose of Risperdal.  Reviewed labs.  If we have not gotten everything ordered we will check and add them.  Engage in individual and group therapy.  Social work in  full treatment team will meet with patient.  Try to arrange for appropriate outpatient plans after discharge  Observation Level/Precautions:  15 minute checks  Laboratory:  Chemistry Profile  Psychotherapy:    Medications:    Consultations:    Discharge Concerns:    Estimated LOS:  Other:     Physician Treatment Plan for Primary Diagnosis: Schizophrenia ([place]) Long Term Goal(s): Improvement in symptoms so as ready for discharge  Short Term Goals: Ability to verbalize feelings will improve, Ability to demonstrate self-control will improve, and Ability to identify and develop effective coping behaviors will improve  Physician Treatment Plan for Secondary Diagnosis: Principal Problem:   Schizophrenia ([place]) Active Problems:   Alcohol abuse   Psychosis ([place])  Long Term Goal(s): Improvement in symptoms so as ready for discharge  Short Term Goals: Compliance with prescribed medications will improve and Ability to identify triggers associated with substance abuse/mental health issues will improve  I certify that inpatient services furnished can reasonably be expected to improve the patient's condition.    Alethia Berthold, MD 6/8/20232:01 PM

## 2021-11-02 NOTE — [facility] Suicide Risk Assessment (Signed)
[facility] Admission Suicide Risk Assessment   Nursing information obtained from:  Patient Demographic factors:  Male, Adolescent or young adult, Low socioeconomic status, Unemployed Current Mental Status:  NA Loss Factors:  Financial problems / change in socioeconomic status Historical Factors:  NA Risk Reduction Factors:  Living with another person, especially a relative  Total Time spent with patient: 1 hour Principal Problem: Schizophrenia ([facility]) Diagnosis:  Principal Problem:   Schizophrenia ([facility]) Active Problems:   Alcohol abuse   Psychosis ([facility])  Subjective Data: Patient seen and chart reviewed.  26 year old man with a history of substance abuse and recurrent psychotic symptoms brought to the hospital under IVC because of agitation at home.  Patient states "I was drinking".  Limited insight.  Denies suicidal or homicidal thought.  Continued Clinical Symptoms:  Alcohol Use Disorder Identification Test Final Score (AUDIT): 3 The "Alcohol Use Disorders Identification Test", Guidelines for Use in Primary Care, Second Edition.  World Science writer [facility]). Score between 0-7:  no or low risk or alcohol related problems. Score between 8-15:  moderate risk of alcohol related problems. Score between 16-19:  high risk of alcohol related problems. Score 20 or above:  warrants further diagnostic evaluation for alcohol dependence and treatment.   CLINICAL FACTORS:   Alcohol/Substance Abuse/Dependencies Schizophrenia:   Paranoid or undifferentiated type   Musculoskeletal: Strength & Muscle Tone: within normal limits Gait & Station: normal Patient leans: N/A  Psychiatric Specialty Exam:  Presentation  General Appearance: Appropriate for Environment  Eye Contact:Fair  Speech:Clear and Coherent  Speech Volume:Normal  Handedness:Right   Mood and Affect  Mood:Depressed  Affect:Appropriate; Congruent   Thought Process  Thought Processes:Coherent  Descriptions of  Associations:Intact  Orientation:Full (Time, Place and Person)  Thought Content:WDL  History of Schizophrenia/Schizoaffective disorder:No  Duration of Psychotic Symptoms:Less than six months  Hallucinations:Hallucinations: None  Ideas of Reference:None  Suicidal Thoughts:Suicidal Thoughts: Yes, Passive SI Passive Intent and/or Plan: Without Intent; Without Plan  Homicidal Thoughts:Homicidal Thoughts: No   Sensorium  Memory:Immediate Fair; Recent Fair  Judgment:Fair  Insight:Fair   Executive Functions  Concentration:Fair  Attention Span:Fair  Recall:Fair  Fund of Knowledge:Fair  Language:Poor   Psychomotor Activity  Psychomotor Activity:Psychomotor Activity: Normal   Assets  Assets:Desire for Improvement; Financial Resources/Insurance; Housing; Resilience; Social Support   Sleep  Sleep:Sleep: Fair    Physical Exam: Physical Exam Vitals and nursing note reviewed.  Constitutional:      Appearance: Normal appearance.  HENT:     Head: Normocephalic and atraumatic.     Mouth/Throat:     Pharynx: Oropharynx is clear.  Eyes:     Pupils: Pupils are equal, round, and reactive to light.  Cardiovascular:     Rate and Rhythm: Normal rate and regular rhythm.  Pulmonary:     Effort: Pulmonary effort is normal.     Breath sounds: Normal breath sounds.  Abdominal:     General: Abdomen is flat.     Palpations: Abdomen is soft.  Musculoskeletal:        General: Normal range of motion.  Skin:    General: Skin is warm and dry.  Neurological:     General: No focal deficit present.     Mental Status: He is alert. Mental status is at baseline.  Psychiatric:        Attention and Perception: He is inattentive.        Mood and Affect: Mood normal. Affect is labile and inappropriate.        Speech: Speech is tangential.  Behavior: Behavior is cooperative.        Thought Content: Thought content normal.        Cognition and Memory: Memory is impaired.         Judgment: Judgment is impulsive.    Review of Systems  Constitutional: Negative.   HENT: Negative.    Eyes: Negative.   Respiratory: Negative.    Cardiovascular: Negative.   Gastrointestinal: Negative.   Musculoskeletal: Negative.   Skin: Negative.   Neurological: Negative.   Psychiatric/Behavioral:  Positive for hallucinations and substance abuse. Negative for depression and suicidal ideas. The patient is nervous/anxious and has insomnia.    Blood pressure 102/64, pulse 91, temperature 99.3 F (37.4 C), temperature source Oral, resp. rate 18, height 5\' 11"  (1.803 m), weight 64.4 kg, SpO2 100 %. Body mass index is 19.8 kg/m.   COGNITIVE FEATURES THAT CONTRIBUTE TO RISK:  Loss of executive function    SUICIDE RISK:   Minimal: No identifiable suicidal ideation.  Patients presenting with no risk factors but with morbid ruminations; may be classified as minimal risk based on the severity of the depressive symptoms  PLAN OF CARE: Continue 15-minute checks.  Restart and continue antipsychotic medication.  Engage in individual and group therapy.  Continue providing substance abuse treatment recommendations.  Ongoing assessment of dangerousness prior to discharge  I certify that inpatient services furnished can reasonably be expected to improve the patient's condition.   , MD 11/02/2021, 1:56 PM

## 2021-11-02 NOTE — Plan of Care (Signed)
  Problem: Education: Goal: Knowledge of [facility] Education information/materials will improve Outcome: Progressing Goal: Verbalization of understanding the information provided will improve Outcome: Progressing   Problem: Safety: Goal: Periods of time without injury will increase Outcome: Progressing   Problem: Education: Goal: Knowledge of the prescribed therapeutic regimen will improve Outcome: Progressing   

## 2021-11-02 NOTE — Group Note (Signed)
[facility] LCSW Group Therapy Note   Group Date: 11/02/2021 Start Time: 1300 End Time: 1400   Type of Therapy/Topic:  Group Therapy:  Emotion Regulation  Participation Level:  Did Not Attend   Mood:  Description of Group:    The purpose of this group is to assist patients in learning to regulate negative emotions and experience positive emotions. Patients will be guided to discuss ways in which they have been vulnerable to their negative emotions. These vulnerabilities will be juxtaposed with experiences of positive emotions or situations, and patients challenged to use positive emotions to combat negative ones. Special emphasis will be placed on coping with negative emotions in conflict situations, and patients will process healthy conflict resolution skills.  Therapeutic Goals: Patient will identify two positive emotions or experiences to reflect on in order to balance out negative emotions:  Patient will label two or more emotions that they find the most difficult to experience:  Patient will be able to demonstrate positive conflict resolution skills through discussion or role plays:   Summary of Patient Progress:   X    Therapeutic Modalities:   Cognitive Behavioral Therapy Feelings Identification Dialectical Behavioral Therapy   Jarel Cuadra J Keta [place], LCSW 
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
## 2021-11-03 DIAGNOSIS — F203 Undifferentiated schizophrenia: Secondary | ICD-10-CM | POA: Diagnosis not present

## 2021-11-03 MED ORDER — RISPERIDONE 1 MG PO TBDP
2.0000 mg | ORAL_TABLET | Freq: Two times a day (BID) | ORAL | Status: DC
  Administered 2021-11-03 – 2021-11-06 (×6): 2 mg via ORAL
  Filled 2021-11-03 (×6): qty 2

## 2021-11-03 NOTE — Progress Notes (Signed)
Recreation Therapy Notes  INPATIENT RECREATION THERAPY ASSESSMENT  Patient Details Name: Osiel Stick MRN: [id number] DOB: 1996/03/21 Today's Date: 11/03/2021       Information Obtained From: Patient  Able to Participate in Assessment/Interview: Yes  Patient Presentation: Responsive  Reason for Admission (Per Patient): Active Symptoms, Substance Abuse  Patient Stressors: Work  Pharmacologist:   Talk  Leisure Interests (2+):   (None)  Frequency of Recreation/Participation:    Awareness of Community Resources:  Yes  Community Resources:  Other (Comment) ([facility])  Current Use: Yes  If no, Barriers?:    Expressed Interest in [employer] Information:    [place] of Residence:  [place]  Patient Main Form of Transportation: Car  Patient Strengths:  N/A  Patient Identified Areas of Improvement:  Take medicine  Patient Goal for Hospitalization:  Think about not making the same mistake  Current SI (including self-harm):  No  Current HI:  No  Current AVH: No  Staff Intervention Plan: Group Attendance, Collaborate with Interdisciplinary Treatment Team  Consent to Intern Participation: N/A  Airiel Oblinger 11/03/2021, 3:06 PM

## 2021-11-03 NOTE — Progress Notes (Signed)
Recreation Therapy Notes   Date: 11/03/2021  Time: 1:20pm   Location: Craft room     Behavioral response: N/A   Intervention Topic: Wellness   Discussion/Intervention: Patient refused to attend group.   Clinical Observations/Feedback:  Patient refused to attend group.    Cova Knieriem LRT/CTRS        Cordera Stineman 11/03/2021 2:49 PM

## 2021-11-03 NOTE — BH IP Treatment Plan (Signed)
Interdisciplinary Treatment and Diagnostic Plan Update  11/03/2021 Time of Session: 9:30 AM Ronald [name] MRN: [id number]  Principal Diagnosis: Schizophrenia [facility])  Secondary Diagnoses: Principal Problem:   Schizophrenia (HCC) Active Problems:   Alcohol abuse   Psychosis (HCC)   Current Medications:  Current Facility-Administered Medications  Medication Dose Route Frequency Provider Last Rate Last Admin   acetaminophen (TYLENOL) tablet 650 mg  650 mg Oral Q6H PRN Vanetta Mulders, NP       alum & mag hydroxide-simeth (MAALOX/MYLANTA) 200-200-20 MG/5ML suspension 30 mL  30 mL Oral Q4H PRN Gabriel Cirri F, NP       benztropine (COGENTIN) tablet 0.5 mg  0.5 mg Oral QHS Gabriel Cirri F, NP   0.5 mg at 11/02/21 2105   hydrOXYzine (ATARAX) tablet 50 mg  50 mg Oral TID PRN Vanetta Mulders, NP       magnesium hydroxide (MILK OF MAGNESIA) suspension 30 mL  30 mL Oral Daily PRN Gabriel Cirri F, NP       nicotine polacrilex (NICORETTE) gum 2 mg  2 mg Oral PRN Clapacs, Jackquline Denmark, MD   2 mg at 11/03/21 0813   risperiDONE (RISPERDAL M-TABS) disintegrating tablet 1 mg  1 mg Oral BID Gabriel Cirri F, NP   1 mg at 11/03/21 4098   traZODone (DESYREL) tablet 50 mg  50 mg Oral QHS PRN Gillermo Murdoch, NP   50 mg at 11/02/21 2213   PTA Medications: Medications Prior to Admission  Medication Sig Dispense Refill Last Dose   benztropine (COGENTIN) 0.5 MG tablet Take 1 tablet (0.5 mg total) by mouth at bedtime. (Patient not taking: Reported on 01/29/2020) 30 tablet 0    risperiDONE (RISPERDAL M-TABS) 1 MG disintegrating tablet Take 1 tablet (1 mg total) by mouth 2 (two) times daily. (Patient not taking: Reported on 04/05/2021) 60 tablet 1     Patient Stressors: Financial difficulties   Marital or family conflict   Substance abuse    Patient Strengths: Active sense of humor  General fund of knowledge   Treatment Modalities: Medication Management, Group therapy, Case management,  1 to 1  session with clinician, Psychoeducation, Recreational therapy.   Physician Treatment Plan for Primary Diagnosis: Schizophrenia (HCC) Long Term Goal(s): Improvement in symptoms so as ready for discharge   Short Term Goals: Compliance with prescribed medications will improve Ability to identify triggers associated with substance abuse/mental health issues will improve Ability to verbalize feelings will improve Ability to demonstrate self-control will improve Ability to identify and develop effective coping behaviors will improve  Medication Management: Evaluate patient's response, side effects, and tolerance of medication regimen.  Therapeutic Interventions: 1 to 1 sessions, Unit Group sessions and Medication administration.  Evaluation of Outcomes: Progressing  Physician Treatment Plan for Secondary Diagnosis: Principal Problem:   Schizophrenia (HCC) Active Problems:   Alcohol abuse   Psychosis (HCC)  Long Term Goal(s): Improvement in symptoms so as ready for discharge   Short Term Goals: Compliance with prescribed medications will improve Ability to identify triggers associated with substance abuse/mental health issues will improve Ability to verbalize feelings will improve Ability to demonstrate self-control will improve Ability to identify and develop effective coping behaviors will improve     Medication Management: Evaluate patient's response, side effects, and tolerance of medication regimen.  Therapeutic Interventions: 1 to 1 sessions, Unit Group sessions and Medication administration.  Evaluation of Outcomes: Progressing   RN Treatment Plan for Primary Diagnosis: Schizophrenia (HCC) Long Term Goal(s): Knowledge of disease and therapeutic  regimen to maintain health will improve  Short Term Goals: Ability to remain free from injury will improve, Ability to verbalize frustration and anger appropriately will improve, Ability to demonstrate self-control, Ability to  participate in decision making will improve, Ability to verbalize feelings will improve, Ability to disclose and discuss suicidal ideas, Ability to identify and develop effective coping behaviors will improve, and Compliance with prescribed medications will improve  Medication Management: RN will administer medications as ordered by provider, will assess and evaluate patient's response and provide education to patient for prescribed medication. RN will report any adverse and/or side effects to prescribing provider.  Therapeutic Interventions: 1 on 1 counseling sessions, Psychoeducation, Medication administration, Evaluate responses to treatment, Monitor vital signs and CBGs as ordered, Perform/monitor CIWA, COWS, AIMS and Fall Risk screenings as ordered, Perform wound care treatments as ordered.  Evaluation of Outcomes: Progressing   LCSW Treatment Plan for Primary Diagnosis: Schizophrenia (HCC) Long Term Goal(s): Safe transition to appropriate next level of care at discharge, Engage patient in therapeutic group addressing interpersonal concerns.  Short Term Goals: Engage patient in aftercare planning with referrals and resources, Increase social support, Increase ability to appropriately verbalize feelings, Increase emotional regulation, Facilitate acceptance of mental health diagnosis and concerns, Facilitate patient progression through stages of change regarding substance use diagnoses and concerns, Identify triggers associated with mental health/substance abuse issues, and Increase skills for wellness and recovery  Therapeutic Interventions: Assess for all discharge needs, 1 to 1 time with Social worker, Explore available resources and support systems, Assess for adequacy in community support network, Educate family and significant other(s) on suicide prevention, Complete Psychosocial Assessment, Interpersonal group therapy.  Evaluation of Outcomes: Progressing   Progress in  Treatment: Attending groups: Yes. Participating in groups: Yes. Taking medication as prescribed: Yes. Toleration medication: Yes. Family/Significant other contact made: No, will contact:  if given permission. Patient understands diagnosis: Yes. Discussing patient identified problems/goals with staff: Yes. Medical problems stabilized or resolved: Yes. Denies suicidal/homicidal ideation: Yes. Issues/concerns per patient self-inventory: No. Other: none.  New problem(s) identified: No, Describe:  none identified.  New Short Term/Long Term Goal(s): elimination of symptoms of psychosis, medication management for mood stabilization; elimination of SI thoughts; development of comprehensive mental wellness/sobriety plan.   Patient Goals: "At least not to make the same mistakes."   Discharge Plan or Barriers: CSW will assist pt with development of an appropriate aftercare/discharge plan.    Reason for Continuation of Hospitalization: Medication stabilization  Estimated Length of Stay: 5-7 days  Last 3 [place] Suicide Severity Risk Score: Flowsheet Row Admission (Current) from 11/01/2021 in [facility] INPATIENT BEHAVIORAL MEDICINE ED from 10/30/2021 in [facility] EMERGENCY DEPARTMENT ED from 10/17/2021 in [facility] EMERGENCY DEPARTMENT  C-SSRS RISK CATEGORY Low Risk No Risk No Risk       Last PHQ 2/9 Scores:     No data to display          Scribe for Treatment Team: Glenis Smoker, LCSW 11/03/2021 1:38 PM

## 2021-11-03 NOTE — Progress Notes (Signed)
Patient has been in the day room this evening hanging out and engaging well with other males on the unit of similar age. This is a big change as he has been mostly isolative to himself since arriving.  He denies si hi avh and pain.  He does still endorse depression and anxiety but does not rate them.  He has requested and received medication to help aid in sleep.  Will continue to monitor with q 15 minute safety checks.  Encouraged patient to seek staff with any concerns.     C Butler-Nicholson, LPN

## 2021-11-03 NOTE — Progress Notes (Signed)
[facility] MD Progress Note  11/03/2021 4:32 PM Ronald [name]  MRN:  [id number] Subjective: Follow-up patient with psychosis and substance abuse.  Patient attended treatment team today.  Reports that he still hears things at times feels a little paranoid.  Slept better.  Seems less disorganized overall.  Still rather simplistic in thinking.  No aggressive or dangerous behavior Principal Problem: Schizophrenia (HCC) Diagnosis: Principal Problem:   Schizophrenia (HCC) Active Problems:   Alcohol abuse   Psychosis (HCC)  Total Time spent with patient: 30 minutes  Past Psychiatric History: Past history of psychotic symptoms and substance abuse  Past Medical History:  Past Medical History:  Diagnosis Date   Asthma    History reviewed. No pertinent surgical history. Family History: History reviewed. No pertinent family history. Family Psychiatric  History: See previous Social History:  Social History   Substance and Sexual Activity  Alcohol Use Yes   Alcohol/week: 2.0 standard drinks of alcohol   Types: 2 Cans of beer per week   Comment: every other week     Social History   Substance and Sexual Activity  Drug Use Yes   Types: Marijuana   Comment: weekly    Social History   Socioeconomic History   Marital status: Single    Spouse name: Not on file   Number of children: Not on file   Years of education: Not on file   Highest education level: Not on file  Occupational History   Not on file  Tobacco Use   Smoking status: Every Day    Packs/day: 1.00    Types: Cigarettes   Smokeless tobacco: Never  Vaping Use   Vaping Use: Never used  Substance and Sexual Activity   Alcohol use: Yes    Alcohol/week: 2.0 standard drinks of alcohol    Types: 2 Cans of beer per week    Comment: every other week   Drug use: Yes    Types: Marijuana    Comment: weekly   Sexual activity: Yes    Partners: Female    Birth control/protection: Condom  Other Topics Concern   Not on file  Social  History Narrative   ** Merged History Encounter **       Social Determinants of Health   Financial Resource Strain: Not on file  Food Insecurity: Not on file  Transportation Needs: Not on file  Physical Activity: Not on file  Stress: Not on file  Social Connections: Not on file   Additional Social History:                         Sleep: Fair  Appetite:  Fair  Current Medications: Current Facility-Administered Medications  Medication Dose Route Frequency Provider Last Rate Last Admin   acetaminophen (TYLENOL) tablet 650 mg  650 mg Oral Q6H PRN Vanetta Mulders, NP       alum & mag hydroxide-simeth (MAALOX/MYLANTA) 200-200-20 MG/5ML suspension 30 mL  30 mL Oral Q4H PRN Gabriel Cirri F, NP       benztropine (COGENTIN) tablet 0.5 mg  0.5 mg Oral QHS Gabriel Cirri F, NP   0.5 mg at 11/02/21 2105   hydrOXYzine (ATARAX) tablet 50 mg  50 mg Oral TID PRN Vanetta Mulders, NP       magnesium hydroxide (MILK OF MAGNESIA) suspension 30 mL  30 mL Oral Daily PRN Gabriel Cirri F, NP       nicotine polacrilex (NICORETTE) gum 2 mg  2 mg  Oral PRN Barbi Kumagai, Jackquline DenmarkJohn T, MD   2 mg at 11/03/21 0813   risperiDONE (RISPERDAL M-TABS) disintegrating tablet 1 mg  1 mg Oral BID Gabriel CirriBarthold, Louise F, NP   1 mg at 11/03/21 16100812   traZODone (DESYREL) tablet 50 mg  50 mg Oral QHS PRN Gillermo Murdochhompson, Jacqueline, NP   50 mg at 11/02/21 2213    Lab Results:  Results for orders placed or performed during the hospital encounter of 11/01/21 (from the past 48 hour(s))  Hemoglobin A1c     Status: None   Collection Time: 11/02/21  2:30 PM  Result Value Ref Range   Hgb A1c MFr Bld 5.1 4.8 - 5.6 %    Comment: (NOTE) Pre diabetes:          5.7%-6.4%  Diabetes:              >6.4%  Glycemic control for   <7.0% adults with diabetes    Mean Plasma Glucose 99.67 mg/dL    Comment: Performed at [facility], [address] [place], [place] [address]  Lipid panel     Status: None   Collection Time:  11/02/21  2:30 PM  Result Value Ref Range   Cholesterol 141 0 - 200 mg/dL   Triglycerides 93 <409<150 mg/dL   HDL 69 >81>40 mg/dL   Total CHOL/HDL Ratio 2.0 RATIO   VLDL 19 0 - 40 mg/dL   LDL Cholesterol 53 0 - 99 mg/dL    Comment:        Total Cholesterol/HDL:CHD Risk Coronary Heart Disease Risk Table                     Men   Women  1/2 Average Risk   3.4   3.3  Average Risk       5.0   4.4  2 X Average Risk   9.6   7.1  3 X Average Risk  23.4   11.0        Use the calculated Patient Ratio above and the CHD Risk Table to determine the patient's CHD Risk.        ATP III CLASSIFICATION (LDL):  <100     mg/dL   Optimal  191-478100-129  mg/dL   Near or Above                    Optimal  130-159  mg/dL   Borderline  295-621160-189  mg/dL   High  >308>190     mg/dL   Very High Performed at [facility], [address] [place], [place] [address]     Blood Alcohol level:  Lab Results  Component Value Date   ETH 138 (H) 10/30/2021   ETH <10 10/17/2021    Metabolic Disorder Labs: Lab Results  Component Value Date   HGBA1C 5.1 11/02/2021   MPG 99.67 11/02/2021   Lab Results  Component Value Date   PROLACTIN 22.8 (H) 05/08/2019   Lab Results  Component Value Date   CHOL 141 11/02/2021   TRIG 93 11/02/2021   HDL 69 11/02/2021   CHOLHDL 2.0 11/02/2021   VLDL 19 11/02/2021   LDLCALC 53 11/02/2021   LDLCALC 56 05/08/2019    Physical Findings: AIMS:  , ,  ,  ,    CIWA:    COWS:     Musculoskeletal: Strength & Muscle Tone: within normal limits Gait & Station: normal Patient leans: N/A  Psychiatric Specialty Exam:  Presentation  General Appearance: Appropriate for Environment  Eye Contact:Fair  Speech:Clear and Coherent  Speech Volume:Normal  Handedness:Right   Mood and Affect  Mood:Depressed  Affect:Appropriate; Congruent   Thought Process  Thought Processes:Coherent  Descriptions of Associations:Intact  Orientation:Full (Time, Place and  Person)  Thought Content:WDL  History of Schizophrenia/Schizoaffective disorder:No  Duration of Psychotic Symptoms:Less than six months  Hallucinations:No data recorded Ideas of Reference:None  Suicidal Thoughts:No data recorded Homicidal Thoughts:No data recorded  Sensorium  Memory:Immediate Fair; Recent Fair  Judgment:Fair  Insight:Fair   Executive Functions  Concentration:Fair  Attention Span:Fair  Recall:Fair  Fund of Knowledge:Fair  Language:Poor   Psychomotor Activity  Psychomotor Activity:No data recorded  Assets  Assets:Desire for Improvement; Financial Resources/Insurance; Housing; Resilience; Social Support   Sleep  Sleep:No data recorded   Physical Exam: Physical Exam Vitals and nursing note reviewed.  Constitutional:      Appearance: Normal appearance.  HENT:     Head: Normocephalic and atraumatic.     Mouth/Throat:     Pharynx: Oropharynx is clear.  Eyes:     Pupils: Pupils are equal, round, and reactive to light.  Cardiovascular:     Rate and Rhythm: Normal rate and regular rhythm.  Pulmonary:     Effort: Pulmonary effort is normal.     Breath sounds: Normal breath sounds.  Abdominal:     General: Abdomen is flat.     Palpations: Abdomen is soft.  Musculoskeletal:        General: Normal range of motion.  Skin:    General: Skin is warm and dry.  Neurological:     General: No focal deficit present.     Mental Status: He is alert. Mental status is at baseline.  Psychiatric:        Mood and Affect: Mood normal.        Thought Content: Thought content normal.    Review of Systems  Constitutional: Negative.   HENT: Negative.    Eyes: Negative.   Respiratory: Negative.    Cardiovascular: Negative.   Gastrointestinal: Negative.   Musculoskeletal: Negative.   Skin: Negative.   Neurological: Negative.   Psychiatric/Behavioral:  Positive for depression and hallucinations. The patient is nervous/anxious.    Blood pressure (!)  114/98, pulse 83, temperature 98.1 F (36.7 C), temperature source Oral, resp. rate 18, height 5\' 11"  (1.803 m), weight 64.4 kg, SpO2 100 %. Body mass index is 19.8 kg/m.   Treatment Plan Summary: Medication management and Plan increase Risperdal to 2 mg twice a day.  Continue involvement in daily assessment groups and activities.  , MD 11/03/2021, 4:32 PM

## 2021-11-03 NOTE — Group Note (Signed)
[facility] LCSW Group Therapy Note   Group Date: 11/03/2021 Start Time: 1030 End Time: 1130   Type of Therapy/Topic:  Group Therapy:  Balance in Life  Participation Level:  Active   Description of Group:    This group will address the concept of balance and how it feels and looks when one is unbalanced. Patients will be encouraged to process areas in their lives that are out of balance, and identify reasons for remaining unbalanced. Facilitators will guide patients utilizing problem- solving interventions to address and correct the stressor making their life unbalanced. Understanding and applying boundaries will be explored and addressed for obtaining  and maintaining a balanced life. Patients will be encouraged to explore ways to assertively make their unbalanced needs known to significant others in their lives, using other group members and facilitator for support and feedback.  Therapeutic Goals: Patient will identify two or more emotions or situations they have that consume much of in their lives. Patient will identify signs/triggers that life has become out of balance:  Patient will identify two ways to set boundaries in order to achieve balance in their lives:  Patient will demonstrate ability to communicate their needs through discussion and/or role plays  Summary of Patient Progress: Patient was present for the entirety of group. He stated that betrayal is something that causes him to become off balanced. He shared that his mother put him in the hospital but that he is trying to make the most of the situation. Breathing exercises, taking a walk, exercise, and music was identified as things that help him maintain balance. Pt was open and receptive to feedback/comments from peers and facilitator.  Therapeutic Modalities:   Cognitive Behavioral Therapy Solution-Focused Therapy Assertiveness Training   Glenis Smoker, LCSW

## 2021-11-03 NOTE — Plan of Care (Signed)
Patient stayed in bed most of the time. Patient states " I feels better now. I slept good." Denies SI,HI and AVH. Visible in the milieu at this time. Appropriate with staff & peers. Compliant with medications. Attended groups. Appetite and energy level good. Support and encouragement given.

## 2021-11-03 NOTE — Plan of Care (Signed)
  Problem: Education: Goal: Emotional status will improve Outcome: Progressing Goal: Mental status will improve Outcome: Progressing Goal: Verbalization of understanding the information provided will improve Outcome: Progressing   Problem: Self-Concept: Goal: Level of anxiety will decrease Outcome: Progressing   Problem: Nutrition: Goal: Adequate nutrition will be maintained Outcome: Progressing   Problem: Coping: Goal: Level of anxiety will decrease Outcome: Progressing

## 2021-11-04 NOTE — Progress Notes (Signed)
Patient denies SI, HI & AVH. Patient spent most of the shift resting in bed. He was compliant with medications. No new behavioral issues to report on shift at this time.

## 2021-11-04 NOTE — Plan of Care (Signed)
Mostly in room but has been coming out for medications and meals. Guarded and restless, frequently requesting to be discharged. Preoccupied and guarded. Denying hallucinations but often talks to self. No aggressive behaviors noted. Discharge information provided. Staff continue to provide support and encouragements.

## 2021-11-04 NOTE — [facility] Group Notes (Signed)
LCSW Wellness Group Note   11/04/2021 1:00pm  Type of Group and Topic: Psychoeducational Group:  Wellness  Participation Level:  Active  Description of Group  Wellness group introduces the topic and its focus on developing healthy habits across the spectrum and its relationship to a decrease in hospital admissions.  Six areas of wellness are discussed: physical, social spiritual, intellectual, occupational, and emotional.  Patients are asked to consider their current wellness habits and to identify areas of wellness where they are interested and able to focus on improvements.    Therapeutic Goals Patients will understand components of wellness and how they can positively impact overall health.  Patients will identify areas of wellness where they have developed good habits. Patients will identify areas of wellness where they would like to make improvements.    Summary of Patient Progress: was active in the group and interacted with other group members.  Pt read from the handout and shared examples from his current situation.  Pt identified physical and intellectual as positive wellness areas from his life.  Pt identified spiritual as a wellness area that could use improvement and said "reading more" was one step he could take to improve it.      Therapeutic Modalities: Cognitive Behavioral Therapy Psychoeducation    Lorri Frederick, LCSW

## 2021-11-04 NOTE — Progress Notes (Signed)
[facility]  11/04/2021 9:39 AM Ronald [name]  MRN:  [id number] Subjective:  Patient stayed in bed most of the time. Patient states " I feels better now. I slept good." Denies SI,HI and AVH. Visible in the milieu at this time. Appropriate with staff & peers. Compliant with medications. Attended groups. Appetite and energy level good.  Patient reports feeling well today and denies major issues. He slept well last night, is denying current SI/HI or any AVH and just wants to rest for now.  Principal Problem: Schizophrenia (HCC) Diagnosis: Principal Problem:   Schizophrenia (HCC) Active Problems:   Alcohol abuse   Psychosis (HCC)  Total Time spent with patient: 20 minutes  Past Psychiatric History: Schizophrenia  Past Medical History:  Past Medical History:  Diagnosis Date   Asthma    History reviewed. No pertinent surgical history. Family History: History reviewed. No pertinent family history. Family Psychiatric  History: Denies Social History:  Social History   Substance and Sexual Activity  Alcohol Use Yes   Alcohol/week: 2.0 standard drinks of alcohol   Types: 2 Cans of beer per week   Comment: every other week     Social History   Substance and Sexual Activity  Drug Use Yes   Types: Marijuana   Comment: weekly    Social History   Socioeconomic History   Marital status: Single    Spouse name: Not on file   Number of children: Not on file   Years of education: Not on file   Highest education level: Not on file  Occupational History   Not on file  Tobacco Use   Smoking status: Every Day    Packs/day: 1.00    Types: Cigarettes   Smokeless tobacco: Never  Vaping Use   Vaping Use: Never used  Substance and Sexual Activity   Alcohol use: Yes    Alcohol/week: 2.0 standard drinks of alcohol    Types: 2 Cans of beer per week    Comment: every other week   Drug use: Yes    Types: Marijuana    Comment: weekly   Sexual activity: Yes    Partners: Female     Birth control/protection: Condom  Other Topics Concern   Not on file  Social History Narrative   ** Merged History Encounter **       Social Determinants of Health   Financial Resource Strain: Not on file  Food Insecurity: Not on file  Transportation Needs: Not on file  Physical Activity: Not on file  Stress: Not on file  Social Connections: Not on file   Additional Social History:                         Sleep: Fair  Appetite:  Fair  Current Medications: Current Facility-Administered Medications  Medication Dose Route Frequency Provider Last Rate Last Admin   acetaminophen (TYLENOL) tablet 650 mg  650 mg Oral Q6H PRN Vanetta MuldersBarthold, Louise F, NP       alum & mag hydroxide-simeth (MAALOX/MYLANTA) 200-200-20 MG/5ML suspension 30 mL  30 mL Oral Q4H PRN Gabriel CirriBarthold, Louise F, NP       benztropine (COGENTIN) tablet 0.5 mg  0.5 mg Oral QHS Gabriel CirriBarthold, Louise F, NP   0.5 mg at 11/03/21 2211   hydrOXYzine (ATARAX) tablet 50 mg  50 mg Oral TID PRN Vanetta MuldersBarthold, Louise F, NP       magnesium hydroxide (MILK OF MAGNESIA) suspension 30 mL  30 mL Oral  Daily PRN Gabriel Cirri F, NP       nicotine polacrilex (NICORETTE) gum 2 mg  2 mg Oral PRN Clapacs, Jackquline Denmark, MD   2 mg at 11/03/21 0813   risperiDONE (RISPERDAL M-TABS) disintegrating tablet 2 mg  2 mg Oral BID Clapacs, Jackquline Denmark, MD   2 mg at 11/04/21 0831   traZODone (DESYREL) tablet 50 mg  50 mg Oral QHS PRN Gillermo Murdoch, NP   50 mg at 11/03/21 2210    Lab Results:  Results for orders placed or performed during the hospital encounter of 11/01/21 (from the past 48 hour(s))  Hemoglobin A1c     Status: None   Collection Time: 11/02/21  2:30 PM  Result Value Ref Range   Hgb A1c MFr Bld 5.1 4.8 - 5.6 %    Comment: (NOTE) Pre diabetes:          5.7%-6.4%  Diabetes:              >6.4%  Glycemic control for   <7.0% adults with diabetes    Mean Plasma Glucose 99.67 mg/dL    Comment: Performed at [facility], [address] [place], [place] [address]  Lipid panel     Status: None   Collection Time: 11/02/21  2:30 PM  Result Value Ref Range   Cholesterol 141 0 - 200 mg/dL   Triglycerides 93 <681 mg/dL   HDL 69 >15 mg/dL   Total CHOL/HDL Ratio 2.0 RATIO   VLDL 19 0 - 40 mg/dL   LDL Cholesterol 53 0 - 99 mg/dL    Comment:        Total Cholesterol/HDL:CHD Risk Coronary Heart Disease Risk Table                     Men   Women  1/2 Average Risk   3.4   3.3  Average Risk       5.0   4.4  2 X Average Risk   9.6   7.1  3 X Average Risk  23.4   11.0        Use the calculated Patient Ratio above and the CHD Risk Table to determine the patient's CHD Risk.        ATP III CLASSIFICATION (LDL):  <100     mg/dL   Optimal  726-203  mg/dL   Near or Above                    Optimal  130-159  mg/dL   Borderline  559-741  mg/dL   High  >638     mg/dL   Very High Performed at [facility], [address] [place], [place] [address]     Blood Alcohol level:  Lab Results  Component Value Date   ETH 138 (H) 10/30/2021   ETH <10 10/17/2021    Metabolic Disorder Labs: Lab Results  Component Value Date   HGBA1C 5.1 11/02/2021   MPG 99.67 11/02/2021   Lab Results  Component Value Date   PROLACTIN 22.8 (H) 05/08/2019   Lab Results  Component Value Date   CHOL 141 11/02/2021   TRIG 93 11/02/2021   HDL 69 11/02/2021   CHOLHDL 2.0 11/02/2021   VLDL 19 11/02/2021   LDLCALC 53 11/02/2021   LDLCALC 56 05/08/2019    Physical Findings: AIMS:  , ,  ,  ,    CIWA:    COWS:  Musculoskeletal: Strength & Muscle Tone: within normal limits Gait & Station: normal Patient leans: N/A  Psychiatric Specialty Exam:  Presentation  General Appearance: Appropriate for Environment  Eye Contact:Fair  Speech:Clear and Coherent  Speech Volume:Normal  Handedness:Right   Mood and Affect  Mood:Depressed  Affect:Appropriate; Congruent   Thought Process  Thought  Processes:Coherent  Descriptions of Associations:Intact  Orientation:Full (Time, Place and Person)  Thought Content:WDL  History of Schizophrenia/Schizoaffective disorder:No  Duration of Psychotic Symptoms:Less than six months  Hallucinations:No data recorded Ideas of Reference:None  Suicidal Thoughts:No data recorded Homicidal Thoughts:No data recorded  Sensorium  Memory:Immediate Fair; Recent Fair  Judgment:Fair  Insight:Fair   Executive Functions  Concentration:Fair  Attention Span:Fair  Recall:Fair  Fund of Knowledge:Fair  Language:Poor   Psychomotor Activity  Psychomotor Activity:No data recorded  Assets  Assets:Desire for Improvement; Financial Resources/Insurance; Housing; Resilience; Social Support   Sleep  Sleep:No data recorded   Physical Exam: Physical Exam Constitutional:      Appearance: Normal appearance. He is normal weight.  HENT:     Head: Normocephalic and atraumatic.     Right Ear: External ear normal.     Left Ear: External ear normal.     Nose: Nose normal.     Mouth/Throat:     Mouth: Mucous membranes are moist.     Pharynx: Oropharynx is clear.  Eyes:     Extraocular Movements: Extraocular movements intact.     Conjunctiva/sclera: Conjunctivae normal.     Pupils: Pupils are equal, round, and reactive to light.  Cardiovascular:     Rate and Rhythm: Normal rate and regular rhythm.     Pulses: Normal pulses.     Heart sounds: Normal heart sounds.  Pulmonary:     Effort: Pulmonary effort is normal.     Breath sounds: Normal breath sounds.  Abdominal:     General: Abdomen is flat. Bowel sounds are normal.     Palpations: Abdomen is soft.  Musculoskeletal:        General: Normal range of motion.     Cervical back: Normal range of motion and neck supple.  Skin:    General: Skin is warm and dry.  Neurological:     General: No focal deficit present.     Mental Status: He is alert and oriented to person, place, and time.  Mental status is at baseline.    Review of Systems  Constitutional: Negative.   HENT: Negative.    Eyes: Negative.   Respiratory: Negative.    Cardiovascular: Negative.   Gastrointestinal: Negative.   Genitourinary: Negative.   Musculoskeletal: Negative.   Skin: Negative.   Neurological: Negative.   Endo/Heme/Allergies: Negative.   Psychiatric/Behavioral:  Positive for hallucinations. The patient is nervous/anxious.    Blood pressure 92/65, pulse 76, temperature 97.7 F (36.5 C), temperature source Oral, resp. rate 18, height 5\' 11"  (1.803 m), weight 64.4 kg, SpO2 100 %. Body mass index is 19.8 kg/m.   Treatment Plan Summary: Daily contact with patient to assess and evaluate symptoms and progress in treatment, Medication management, and Plan : Continue current meds and doses.   11/04/2021, 9:39 AM

## 2021-11-05 NOTE — Progress Notes (Signed)
[name] MD Progress Note  11/05/2021 9:27 AM Ronald [name]  MRN:  [id number] Subjective:  Mostly in room but has been coming out for medications and meals. Guarded and restless, frequently requesting to be discharged. Preoccupied and guarded. Denying hallucinations but often talks to self. No aggressive behaviors noted.   Patient reports feeling well today; he slept well last night, is compliant with his medicines and is denying current SI/HI or any AVH. He is attending some groups and is asleep mostly rest of the time. He is hoping to go home soon.  Principal Problem: Schizophrenia (HCC) Diagnosis: Principal Problem:   Schizophrenia (HCC) Active Problems:   Alcohol abuse   Psychosis (HCC)  Total Time spent with patient: 20 minutes  Past Psychiatric History:  Schizophrenia  Past Medical History:  Past Medical History:  Diagnosis Date   Asthma    History reviewed. No pertinent surgical history. Family History: History reviewed. No pertinent family history. Family Psychiatric  History:  Social History:  Social History   Substance and Sexual Activity  Alcohol Use Yes   Alcohol/week: 2.0 standard drinks of alcohol   Types: 2 Cans of beer per week   Comment: every other week     Social History   Substance and Sexual Activity  Drug Use Yes   Types: Marijuana   Comment: weekly    Social History   Socioeconomic History   Marital status: Single    Spouse name: Not on file   Number of children: Not on file   Years of education: Not on file   Highest education level: Not on file  Occupational History   Not on file  Tobacco Use   Smoking status: Every Day    Packs/day: 1.00    Types: Cigarettes   Smokeless tobacco: Never  Vaping Use   Vaping Use: Never used  Substance and Sexual Activity   Alcohol use: Yes    Alcohol/week: 2.0 standard drinks of alcohol    Types: 2 Cans of beer per week    Comment: every other week   Drug use: Yes    Types: Marijuana    Comment: weekly    Sexual activity: Yes    Partners: Female    Birth control/protection: Condom  Other Topics Concern   Not on file  Social History Narrative   ** Merged History Encounter **       Social Determinants of Health   Financial Resource Strain: Not on file  Food Insecurity: Not on file  Transportation Needs: Not on file  Physical Activity: Not on file  Stress: Not on file  Social Connections: Not on file   Additional Social History:                         Sleep: Good  Appetite:  Fair  Current Medications: Current Facility-Administered Medications  Medication Dose Route Frequency Provider Last Rate Last Admin   acetaminophen (TYLENOL) tablet 650 mg  650 mg Oral Q6H PRN Vanetta Mulders, NP       alum & mag hydroxide-simeth (MAALOX/MYLANTA) 200-200-20 MG/5ML suspension 30 mL  30 mL Oral Q4H PRN Gabriel Cirri F, NP       benztropine (COGENTIN) tablet 0.5 mg  0.5 mg Oral QHS Gabriel Cirri F, NP   0.5 mg at 11/04/21 2147   hydrOXYzine (ATARAX) tablet 50 mg  50 mg Oral TID PRN Vanetta Mulders, NP       magnesium hydroxide (MILK OF MAGNESIA)  suspension 30 mL  30 mL Oral Daily PRN Gabriel CirriBarthold, Louise F, NP       nicotine polacrilex (NICORETTE) gum 2 mg  2 mg Oral PRN Clapacs, Jackquline DenmarkJohn T, MD   2 mg at 11/03/21 0813   risperiDONE (RISPERDAL M-TABS) disintegrating tablet 2 mg  2 mg Oral BID Clapacs, Jackquline DenmarkJohn T, MD   2 mg at 11/05/21 0809   traZODone (DESYREL) tablet 50 mg  50 mg Oral QHS PRN Gillermo Murdochhompson, Jacqueline, NP   50 mg at 11/04/21 2147    Lab Results: No results found for this or any previous visit (from the past 48 hour(s)).  Blood Alcohol level:  Lab Results  Component Value Date   ETH 138 (H) 10/30/2021   ETH <10 10/17/2021    Metabolic Disorder Labs: Lab Results  Component Value Date   HGBA1C 5.1 11/02/2021   MPG 99.67 11/02/2021   Lab Results  Component Value Date   PROLACTIN 22.8 (H) 05/08/2019   Lab Results  Component Value Date   CHOL 141 11/02/2021    TRIG 93 11/02/2021   HDL 69 11/02/2021   CHOLHDL 2.0 11/02/2021   VLDL 19 11/02/2021   LDLCALC 53 11/02/2021   LDLCALC 56 05/08/2019    Physical Findings: AIMS:  , ,  ,  ,    CIWA:    COWS:     Musculoskeletal: Strength & Muscle Tone: within normal limits Gait & Station: normal Patient leans: N/A  Psychiatric Specialty Exam:  Presentation  General Appearance: Disheveled  Eye Contact:Fleeting  Speech:Slow  Speech Volume:Decreased  Handedness:Right   Mood and Affect  Mood:Anxious; Irritable  Affect:Constricted   Thought Process  Thought Processes:Goal Directed  Descriptions of Associations:Loose  Orientation:Partial  Thought Content:Paranoid Ideation  History of Schizophrenia/Schizoaffective disorder:Yes  Duration of Psychotic Symptoms:Greater than six months  Hallucinations:Hallucinations: None  Ideas of Reference:None  Suicidal Thoughts:Suicidal Thoughts: No  Homicidal Thoughts:Homicidal Thoughts: No   Sensorium  Memory:Immediate Fair; Remote Fair  Judgment:Fair  Insight:Fair   Executive Functions  Concentration:Fair  Attention Span:Fair  Recall:Fair  Fund of Knowledge:Fair  Language:Fair   Psychomotor Activity  Psychomotor Activity:Psychomotor Activity: Decreased   Assets  Assets:Communication Skills; Physical Health   Sleep  Sleep:Sleep: Fair    Physical Exam: Physical Exam Vitals and nursing note reviewed.  Constitutional:      Appearance: Normal appearance. He is normal weight.  HENT:     Head: Normocephalic and atraumatic.     Right Ear: External ear normal.     Left Ear: External ear normal.     Nose: Nose normal.     Mouth/Throat:     Mouth: Mucous membranes are moist.     Pharynx: Oropharynx is clear.  Eyes:     Extraocular Movements: Extraocular movements intact.     Conjunctiva/sclera: Conjunctivae normal.     Pupils: Pupils are equal, round, and reactive to light.  Cardiovascular:     Rate and  Rhythm: Normal rate and regular rhythm.     Pulses: Normal pulses.     Heart sounds: Normal heart sounds.  Pulmonary:     Effort: Pulmonary effort is normal.     Breath sounds: Normal breath sounds.  Abdominal:     General: Abdomen is flat. Bowel sounds are normal.     Palpations: Abdomen is soft.  Musculoskeletal:        General: Normal range of motion.     Cervical back: Normal range of motion and neck supple.  Skin:    General:  Skin is warm and dry.  Neurological:     General: No focal deficit present.     Mental Status: He is alert.    Review of Systems  Constitutional: Negative.   HENT: Negative.    Eyes: Negative.   Respiratory: Negative.    Cardiovascular: Negative.   Gastrointestinal: Negative.   Genitourinary: Negative.   Musculoskeletal: Negative.   Skin: Negative.   Neurological: Negative.   Endo/Heme/Allergies: Negative.   Psychiatric/Behavioral:  The patient is nervous/anxious.    Blood pressure 100/63, pulse 84, temperature 98.4 F (36.9 C), temperature source Oral, resp. rate 18, height 5\' 11"  (1.803 m), weight 64.4 kg, SpO2 100 %. Body mass index is 19.8 kg/m.   Treatment Plan Summary: Daily contact with patient to assess and evaluate symptoms and progress in treatment, Medication management, and Plan : Continue current meds and doses.   11/05/2021, 9:27 AM

## 2021-11-05 NOTE — Progress Notes (Signed)
D: Patient alert and oriented. Patient denies pain. Patient denies anxiety. Patient endorses depression stating " Idk if you can help with depression, I dont want to bring other people into my personal issues", when asked about depression. Patient denies SI/HI/AVH. Patient remains isolative to room with the exception to coming out for meals and medications.  A: Scheduled medications administered to patient, per MD orders.  Support and encouragement provided to patient.  Q15 minute safety checks maintained.   R: Patient compliant with medication administration and treatment plan. No adverse drug reactions noted. Patient remains safe on the unit at this time.

## 2021-11-06 ENCOUNTER — Other Ambulatory Visit: Payer: Self-pay

## 2021-11-06 DIAGNOSIS — F203 Undifferentiated schizophrenia: Secondary | ICD-10-CM | POA: Diagnosis not present

## 2021-11-06 MED ORDER — BENZTROPINE MESYLATE 0.5 MG PO TABS: 0.5000 mg | ORAL_TABLET | Freq: Every day | ORAL | 1 refills | Status: DC

## 2021-11-06 MED ORDER — TRAZODONE HCL 50 MG PO TABS: 50.0000 mg | ORAL_TABLET | Freq: Every evening | ORAL | 1 refills | Status: DC | PRN

## 2021-11-06 MED ORDER — HYDROXYZINE HCL 50 MG PO TABS: 50.0000 mg | ORAL_TABLET | Freq: Three times a day (TID) | ORAL | 1 refills | Status: DC | PRN

## 2021-11-06 MED ORDER — NICOTINE POLACRILEX 2 MG MT GUM
2.0000 mg | CHEWING_GUM | OROMUCOSAL | 0 refills | Status: DC | PRN
  Filled 2021-11-06: qty 30, fill #0

## 2021-11-06 MED ORDER — TRAZODONE HCL 50 MG PO TABS
50.0000 mg | ORAL_TABLET | Freq: Every evening | ORAL | 0 refills | Status: DC | PRN
  Filled 2021-11-06: qty 10, 10d supply, fill #0

## 2021-11-06 MED ORDER — RISPERIDONE 2 MG PO TBDP
2.0000 mg | ORAL_TABLET | Freq: Two times a day (BID) | ORAL | 0 refills | Status: DC
  Filled 2021-11-06: qty 20, 10d supply, fill #0

## 2021-11-06 MED ORDER — HYDROXYZINE HCL 50 MG PO TABS
50.0000 mg | ORAL_TABLET | Freq: Three times a day (TID) | ORAL | 0 refills | Status: DC | PRN
  Filled 2021-11-06: qty 30, 10d supply, fill #0

## 2021-11-06 MED ORDER — RISPERIDONE 2 MG PO TBDP
2.0000 mg | ORAL_TABLET | Freq: Two times a day (BID) | ORAL | 1 refills | Status: DC
Start: 2021-11-06 — End: 2021-11-06

## 2021-11-06 MED ORDER — NICOTINE POLACRILEX 2 MG MT GUM: 2.0000 mg | CHEWING_GUM | OROMUCOSAL | 0 refills | Status: DC | PRN

## 2021-11-06 MED ORDER — BENZTROPINE MESYLATE 0.5 MG PO TABS
0.5000 mg | ORAL_TABLET | Freq: Every day | ORAL | 0 refills | Status: DC
  Filled 2021-11-06: qty 10, 10d supply, fill #0

## 2021-11-06 NOTE — Progress Notes (Signed)
Pt denies SI/HI and AVH. Anxiety score 0/10 and depression 0/10. Pt denies having any pain. Pt did not want to discuss his thoughts or what behaviors brought him into the hospital. Pt visible on the unit, watching TV, with little interaction with peers and staff. Pt encouraged to clean up his room.

## 2021-11-06 NOTE — Group Note (Signed)
[facility] LCSW Group Therapy Note    Group Date: 11/06/2021 Start Time: 1300 End Time: 1400  Type of Therapy and Topic:  Group Therapy:  Overcoming Obstacles  Participation Level:  [facility] PARTICIPATION LEVEL: Active  Mood:  Description of Group:   In this group patients will be encouraged to explore what they see as obstacles to their own wellness and recovery. They will be guided to discuss their thoughts, feelings, and behaviors related to these obstacles. The group will process together ways to cope with barriers, with attention given to specific choices patients can make. Each patient will be challenged to identify changes they are motivated to make in order to overcome their obstacles. This group will be process-oriented, with patients participating in exploration of their own experiences as well as giving and receiving support and challenge from other group members.  Therapeutic Goals: 1. Patient will identify personal and current obstacles as they relate to admission. 2. Patient will identify barriers that currently interfere with their wellness or overcoming obstacles.  3. Patient will identify feelings, thought process and behaviors related to these barriers. 4. Patient will identify two changes they are willing to make to overcome these obstacles:    Summary of Patient Progress Patient was present in group.  Patient was an active participant.  Patient shared that his obstacle has been "not enough resources".  He reports that this has been a barrier for him to access several things.  He was able to identify how to overcome these obstacles as well as coping strategies that he follows.   Therapeutic Modalities:   Cognitive Behavioral Therapy Solution Focused Therapy Motivational Interviewing Relapse Prevention Therapy   Harden Mo, LCSW

## 2021-11-06 NOTE — Progress Notes (Signed)
  [facility] Adult Case Management Discharge Plan :  Will you be returning to the same living situation after discharge:  Yes,  pt reports that he is returning home. At discharge, do you have transportation home?: Yes,  pt reports that a collateral will provide transportation.  Do you have the ability to pay for your medications: Yes,  [employer].  Release of information consent forms completed and in the chart;  Patient's signature needed at discharge.  Patient to Follow up at:  Follow-up Information     [facility] Follow up.   Why: Appointment is scheduled for 11/08/2021 at 10AM. Contact information: [address] [place] [address] [phone]                 Next level of care provider has access to [facility] Link:no  Safety Planning and Suicide Prevention discussed: Yes,  SPE completed with the patient.      Has patient been referred to the Quitline?: Yes, faxed on 11/06/2021  Patient has been referred for addiction treatment: Pt. refused referral  Harden Mo, LCSW 11/06/2021, 11:05 AM

## 2021-11-06 NOTE — Progress Notes (Signed)
Patient ID: Ronald [name], male   DOB: [birth year], 26 y.o.   MRN: [id number] Patient denies SI/HI/AVH. Belongings were returned to patient. Discharge instructions  including medication and follow up information were reviewed with patient and understanding was verbalized. Patient was not observed to be in distress at time of discharge. Patient was escorted out with staff to medical mall to be transported home by family member.

## 2021-11-06 NOTE — Progress Notes (Signed)
Pt up around 3 am, because he thought it was 6 a. He went back to bed.

## 2021-11-06 NOTE — Discharge Summary (Signed)
Physician Discharge Summary Note  Patient:  Ronald [name] is an 26 y.o., male MRN:  [id number] DOB:  [birth year] Patient phone:  [phone] (home)  Patient address:   [place] [address],  Total Time spent with patient: 30 minutes  Date of Admission:  11/01/2021 Date of Discharge: 11/06/2021  Reason for Admission: Patient admitted because of return of some psychotic symptoms in the context of substance abuse  Principal Problem: Schizophrenia [facility]) Discharge Diagnoses: Principal Problem:   Schizophrenia (HCC) Active Problems:   Alcohol abuse   Psychosis (HCC)   Past Psychiatric History: Past history of substance abuse especially alcohol as well as psychotic symptoms probably schizophrenia  Past Medical History:  Past Medical History:  Diagnosis Date   Asthma    History reviewed. No pertinent surgical history. Family History: History reviewed. No pertinent family history. Family Psychiatric  History: See previous Social History:  Social History   Substance and Sexual Activity  Alcohol Use Yes   Alcohol/week: 2.0 standard drinks of alcohol   Types: 2 Cans of beer per week   Comment: every other week     Social History   Substance and Sexual Activity  Drug Use Yes   Types: Marijuana   Comment: weekly    Social History   Socioeconomic History   Marital status: Single    Spouse name: Not on file   Number of children: Not on file   Years of education: Not on file   Highest education level: Not on file  Occupational History   Not on file  Tobacco Use   Smoking status: Every Day    Packs/day: 1.00    Types: Cigarettes   Smokeless tobacco: Never  Vaping Use   Vaping Use: Never used  Substance and Sexual Activity   Alcohol use: Yes    Alcohol/week: 2.0 standard drinks of alcohol    Types: 2 Cans of beer per week    Comment: every other week   Drug use: Yes    Types: Marijuana    Comment: weekly   Sexual activity: Yes    Partners: Female     Birth control/protection: Condom  Other Topics Concern   Not on file  Social History Narrative   ** Merged History Encounter **       Social Determinants of Health   Financial Resource Strain: Not on file  Food Insecurity: Not on file  Transportation Needs: Not on file  Physical Activity: Not on file  Stress: Not on file  Social Connections: Not on file    Hospital Course: Patient admitted to psychiatric unit.  15-minute checks continued.  Patient was cooperative with procedure and treatment and did not display any dangerous behavior in the hospital.  He was cooperative with treatment team and recommendations about medication and therapy.  Tolerated restarting Risperdal without any complication.  At the time of discharge he denies any hallucinations denies paranoia and is able to articulate a positive and rational plan for self-care going forward.  The patient has received psychoeducation about the importance of staying away from alcohol and drugs and continuing medicine.  He will be referred to [facility] for follow-up treatment and states that he plans to stay with his family again.  Physical Findings: AIMS:  , ,  ,  ,    CIWA:    COWS:     Musculoskeletal: Strength & Muscle Tone: within normal limits Gait & Station: normal Patient leans: N/A   Psychiatric Specialty Exam:  Presentation  General Appearance:  Casual  Eye Contact:Fleeting  Speech:Slow  Speech Volume:Decreased  Handedness:Right   Mood and Affect  Mood:Anxious  Affect:Constricted   Thought Process  Thought Processes:Goal Directed  Descriptions of Associations:Circumstantial  Orientation:Full (Time, Place and Person)  Thought Content:Paranoid Ideation  History of Schizophrenia/Schizoaffective disorder:Yes  Duration of Psychotic Symptoms:Greater than six months  Hallucinations:Hallucinations: None  Ideas of Reference:None  Suicidal Thoughts:Suicidal Thoughts: No  Homicidal Thoughts:Homicidal  Thoughts: No   Sensorium  Memory:Immediate Fair; Remote Good  Judgment:Fair  Insight:Fair   Executive Functions  Concentration:Fair  Attention Span:Fair  Recall:Fair  Fund of Knowledge:Fair  Language:Fair   Psychomotor Activity  Psychomotor Activity:Psychomotor Activity: Normal   Assets  Assets:Communication Skills; Housing; Desire for Improvement   Sleep  Sleep:Sleep: Fair    Physical Exam: Physical Exam Vitals and nursing note reviewed.  Constitutional:      Appearance: Normal appearance.  HENT:     Head: Normocephalic and atraumatic.     Mouth/Throat:     Pharynx: Oropharynx is clear.  Eyes:     Pupils: Pupils are equal, round, and reactive to light.  Cardiovascular:     Rate and Rhythm: Normal rate and regular rhythm.  Pulmonary:     Effort: Pulmonary effort is normal.     Breath sounds: Normal breath sounds.  Abdominal:     General: Abdomen is flat.     Palpations: Abdomen is soft.  Musculoskeletal:        General: Normal range of motion.  Skin:    General: Skin is warm and dry.  Neurological:     General: No focal deficit present.     Mental Status: He is alert. Mental status is at baseline.  Psychiatric:        Attention and Perception: Attention normal.        Mood and Affect: Mood normal.        Speech: Speech normal.        Behavior: Behavior normal.        Thought Content: Thought content normal.        Cognition and Memory: Cognition normal.        Judgment: Judgment normal.    Review of Systems  Constitutional: Negative.   HENT: Negative.    Eyes: Negative.   Respiratory: Negative.    Cardiovascular: Negative.   Gastrointestinal: Negative.   Musculoskeletal: Negative.   Skin: Negative.   Neurological: Negative.   Psychiatric/Behavioral: Negative.     Blood pressure 109/65, pulse 77, temperature 98.3 F (36.8 C), temperature source Oral, resp. rate 18, height 5\' 11"  (1.803 m), weight 64.4 kg, SpO2 99 %. Body mass index  is 19.8 kg/m.   Social History   Tobacco Use  Smoking Status Every Day   Packs/day: 1.00   Types: Cigarettes  Smokeless Tobacco Never   Tobacco Cessation:  A prescription for an FDA-approved tobacco cessation medication provided at discharge   Blood Alcohol level:  Lab Results  Component Value Date   ETH 138 (H) 10/30/2021   ETH <10 10/17/2021    Metabolic Disorder Labs:  Lab Results  Component Value Date   HGBA1C 5.1 11/02/2021   MPG 99.67 11/02/2021   Lab Results  Component Value Date   PROLACTIN 22.8 (H) 05/08/2019   Lab Results  Component Value Date   CHOL 141 11/02/2021   TRIG 93 11/02/2021   HDL 69 11/02/2021   CHOLHDL 2.0 11/02/2021   VLDL 19 11/02/2021   LDLCALC 53 11/02/2021   LDLCALC 56 05/08/2019  See Psychiatric Specialty Exam and Suicide Risk Assessment completed by Attending Physician prior to discharge.  Discharge destination:  Home  Is patient on multiple antipsychotic therapies at discharge:  No   Has Patient had three or more failed trials of antipsychotic monotherapy by history:  No  Recommended Plan for Multiple Antipsychotic Therapies: NA  Discharge Instructions     Diet - low sodium heart healthy   Complete by: As directed    Increase activity slowly   Complete by: As directed       Allergies as of 11/06/2021   No Known Allergies      Medication List     TAKE these medications      Indication  benztropine 0.5 MG tablet Commonly known as: COGENTIN Take 1 tablet (0.5 mg total) by mouth at bedtime.  Indication: Extrapyramidal Reaction caused by Medications   hydrOXYzine 50 MG tablet Commonly known as: ATARAX Take 1 tablet (50 mg total) by mouth 3 (three) times daily as needed for anxiety (agitation).  Indication: Feeling Anxious   nicotine polacrilex 2 MG gum Commonly known as: NICORETTE Take 1 each (2 mg total) by mouth as needed for smoking cessation.  Indication: Nicotine Addiction   risperiDONE 2 MG  disintegrating tablet Commonly known as: RISPERDAL M-TABS Take 1 tablet (2 mg total) by mouth 2 (two) times daily. What changed:  medication strength how much to take  Indication: Schizophrenia   traZODone 50 MG tablet Commonly known as: DESYREL Take 1 tablet (50 mg total) by mouth at bedtime as needed for sleep.  Indication: Trouble Sleeping         Follow-up recommendations: Follow up with [facility].  Continue current medicine.  Comments: Supply and prescriptions provided  Signed: Alethia Berthold, MD 11/06/2021, 10:28 AM

## 2021-11-06 NOTE — Plan of Care (Signed)
  Problem: Education: Goal: Knowledge of [facility] Education information/materials will improve Outcome: Progressing Goal: Verbalization of understanding the information provided will improve Outcome: Progressing   Problem: Safety: Goal: Periods of time without injury will increase Outcome: Progressing   Problem: Self-Concept: Goal: Level of anxiety will decrease Outcome: Progressing   Problem: Education: Goal: Knowledge of the prescribed therapeutic regimen will improve Outcome: Progressing

## 2021-11-06 NOTE — Progress Notes (Signed)
Recreation Therapy Notes   Date: 11/06/2021  Time: 10:30am    Location: Craft room     Behavioral response: N/A   Intervention Topic: Self-care  Discussion/Intervention: Patient refused to attend group.   Clinical Observations/Feedback:  Patient refused to attend group.    Vianka Ertel LRT/CTRS         Ronald [name] 11/06/2021 11:53 AM 

## 2021-11-06 NOTE — Plan of Care (Signed)
  Problem: Education: Goal: Knowledge of [facility] Education information/materials will improve 11/06/2021 1037 by Hyman Hopes, RN Outcome: Adequate for Discharge 11/06/2021 0914 by Hyman Hopes, RN Outcome: Progressing Goal: Emotional status will improve Outcome: Adequate for Discharge Goal: Mental status will improve Outcome: Adequate for Discharge Goal: Verbalization of understanding the information provided will improve 11/06/2021 1037 by Hyman Hopes, RN Outcome: Adequate for Discharge 11/06/2021 0914 by Hyman Hopes, RN Outcome: Progressing   Problem: Safety: Goal: Periods of time without injury will increase 11/06/2021 1037 by Hyman Hopes, RN Outcome: Adequate for Discharge 11/06/2021 0914 by Hyman Hopes, RN Outcome: Progressing   Problem: Coping: Goal: Coping ability will improve Outcome: Adequate for Discharge   Problem: Self-Concept: Goal: Level of anxiety will decrease 11/06/2021 1037 by Hyman Hopes, RN Outcome: Adequate for Discharge 11/06/2021 0914 by Hyman Hopes, RN Outcome: Progressing   Problem: Education: Goal: Will be free of psychotic symptoms Outcome: Adequate for Discharge Goal: Knowledge of the prescribed therapeutic regimen will improve 11/06/2021 1037 by Hyman Hopes, RN Outcome: Adequate for Discharge 11/06/2021 0914 by Hyman Hopes, RN Outcome: Progressing   Problem: Education: Goal: Knowledge of General Education information will improve Description: Including pain rating scale, medication(s)/side effects and non-pharmacologic comfort measures Outcome: Adequate for Discharge   Problem: Health Behavior/Discharge Planning: Goal: Ability to manage health-related needs will improve Outcome: Adequate for Discharge   Problem: Clinical Measurements: Goal: Ability to maintain clinical measurements within normal limits will improve Outcome: Adequate for Discharge Goal: Will remain free from  infection Outcome: Adequate for Discharge Goal: Diagnostic test results will improve Outcome: Adequate for Discharge Goal: Respiratory complications will improve Outcome: Adequate for Discharge Goal: Cardiovascular complication will be avoided Outcome: Adequate for Discharge   Problem: Activity: Goal: Risk for activity intolerance will decrease Outcome: Adequate for Discharge   Problem: Nutrition: Goal: Adequate nutrition will be maintained Outcome: Adequate for Discharge   Problem: Coping: Goal: Level of anxiety will decrease Outcome: Adequate for Discharge   Problem: Elimination: Goal: Will not experience complications related to bowel motility Outcome: Adequate for Discharge Goal: Will not experience complications related to urinary retention Outcome: Adequate for Discharge   Problem: Pain Managment: Goal: General experience of comfort will improve Outcome: Adequate for Discharge   Problem: Safety: Goal: Ability to remain free from injury will improve Outcome: Adequate for Discharge   Problem: Skin Integrity: Goal: Risk for impaired skin integrity will decrease Outcome: Adequate for Discharge   Problem: Group Participation Goal: STG - Patient will engage in groups without prompting or encouragement from LRT x3 group sessions within 5 recreation therapy group sessions Description: STG - Patient will engage in groups without prompting or encouragement from LRT x3 group sessions within 5 recreation therapy group sessions Outcome: Adequate for Discharge

## 2021-11-06 NOTE — Progress Notes (Signed)
Recreation Therapy Notes  INPATIENT RECREATION TR PLAN  Patient Details Name: Ronald [name] MRN: [id number] DOB: 12/30/1995 Today's Date: 11/06/2021  Rec Therapy Plan Is patient appropriate for Therapeutic Recreation?: Yes Treatment times per week: at least 3 Estimated Length of Stay: 5-7 days TR Treatment/Interventions: Group participation (Comment)  Discharge Criteria Pt will be discharged from therapy if:: Discharged Treatment plan/goals/alternatives discussed and agreed upon by:: Patient/family  Discharge Summary Short term goals set: Patient will engage in groups without prompting or encouragement from LRT x3 group sessions within 5 recreation therapy group sessions Short term goals met: Not met Reason goals not met: Patient did not attend any groups Therapeutic equipment acquired: N/A Reason patient discharged from therapy: Discharge from hospital Pt/family agrees with progress & goals achieved: Yes Date patient discharged from therapy: 11/06/21   Shaleka Brines 11/06/2021, 12:18 PM

## 2021-11-06 NOTE — [facility] Suicide Risk Assessment (Signed)
[facility] Discharge Suicide Risk Assessment   Principal Problem: Schizophrenia [facility]) Discharge Diagnoses: Principal Problem:   Schizophrenia (HCC) Active Problems:   Alcohol abuse   Psychosis (HCC)   Total Time spent with patient: 30 minutes  Musculoskeletal: Strength & Muscle Tone: within normal limits Gait & Station: normal Patient leans: N/A  Psychiatric Specialty Exam  Presentation  General Appearance: Casual  Eye Contact:Fleeting  Speech:Slow  Speech Volume:Decreased  Handedness:Right   Mood and Affect  Mood:Anxious  Duration of Depression Symptoms: No data recorded Affect:Constricted   Thought Process  Thought Processes:Goal Directed  Descriptions of Associations:Circumstantial  Orientation:Full (Time, Place and Person)  Thought Content:Paranoid Ideation  History of Schizophrenia/Schizoaffective disorder:Yes  Duration of Psychotic Symptoms:Greater than six months  Hallucinations:Hallucinations: None  Ideas of Reference:None  Suicidal Thoughts:Suicidal Thoughts: No  Homicidal Thoughts:Homicidal Thoughts: No   Sensorium  Memory:Immediate Fair; Remote Good  Judgment:Fair  Insight:Fair   Executive Functions  Concentration:Fair  Attention Span:Fair  Recall:Fair  Fund of Knowledge:Fair  Language:Fair   Psychomotor Activity  Psychomotor Activity:Psychomotor Activity: Normal   Assets  Assets:Communication Skills; Housing; Desire for Improvement   Sleep  Sleep:Sleep: Fair   Physical Exam: Physical Exam Vitals and nursing note reviewed.  Constitutional:      Appearance: Normal appearance.  HENT:     Head: Normocephalic and atraumatic.     Mouth/Throat:     Pharynx: Oropharynx is clear.  Eyes:     Pupils: Pupils are equal, round, and reactive to light.  Cardiovascular:     Rate and Rhythm: Normal rate and regular rhythm.  Pulmonary:     Effort: Pulmonary effort is normal.     Breath sounds: Normal breath sounds.   Abdominal:     General: Abdomen is flat.     Palpations: Abdomen is soft.  Musculoskeletal:        General: Normal range of motion.  Skin:    General: Skin is warm and dry.  Neurological:     General: No focal deficit present.     Mental Status: He is alert. Mental status is at baseline.  Psychiatric:        Attention and Perception: Attention normal.        Mood and Affect: Mood normal.        Speech: Speech normal.        Behavior: Behavior normal.        Thought Content: Thought content normal.        Cognition and Memory: Cognition normal.        Judgment: Judgment normal.    Review of Systems  Constitutional: Negative.   HENT: Negative.    Eyes: Negative.   Respiratory: Negative.    Cardiovascular: Negative.   Gastrointestinal: Negative.   Musculoskeletal: Negative.   Skin: Negative.   Neurological: Negative.   Psychiatric/Behavioral: Negative.     Blood pressure 109/65, pulse 77, temperature 98.3 F (36.8 C), temperature source Oral, resp. rate 18, height 5\' 11"  (1.803 m), weight 64.4 kg, SpO2 99 %. Body mass index is 19.8 kg/m.  Mental Status Per Nursing Assessment::   On Admission:  NA  Demographic Factors:  Male  Loss Factors: Financial problems/change in socioeconomic status  Historical Factors: Impulsivity  Risk Reduction Factors:   Sense of responsibility to family, Living with another person, especially a relative, and Positive social support  Continued Clinical Symptoms:  Schizophrenia:   Less than 24 years old  Cognitive Features That Contribute To Risk:  None    Suicide Risk:  Minimal: No identifiable suicidal ideation.  Patients presenting with no risk factors but with morbid ruminations; may be classified as minimal risk based on the severity of the depressive symptoms    Plan Of Care/Follow-up recommendations:  Patient denies all suicidal ideation.  Affect smiling and upbeat.  No evidence of gross psychosis.  Shows pretty good  insight and agrees to outpatient treatment.  Agrees to continue medicine.  Prescriptions and brief supply will be provided and he will be referred to [facility]  Mordecai Rasmussen, MD 11/06/2021, 10:25 AM

## 2021-11-07 MED FILL — Benztropine Mesylate Tab 1 MG: ORAL | Qty: 1 | Status: AC

## 2021-11-07 MED FILL — Risperidone Orally Disintegrating Tab 0.5 MG: ORAL | Qty: 1 | Status: AC

## 2021-12-18 ENCOUNTER — Emergency Department
Admission: EM | Admit: 2021-12-18 | Discharge: 2021-12-19 | Disposition: A | Discharge status: Other Acute Inpatient Hospital | Payer: No Typology Code available for payment source | Attending: Emergency Medicine | Admitting: Emergency Medicine

## 2021-12-18 DIAGNOSIS — F23 Brief psychotic disorder: Secondary | ICD-10-CM | POA: Diagnosis not present

## 2021-12-18 DIAGNOSIS — Z043 Encounter for examination and observation following other accident: Secondary | ICD-10-CM | POA: Diagnosis present

## 2021-12-18 DIAGNOSIS — Z20822 Contact with and (suspected) exposure to covid-19: Secondary | ICD-10-CM | POA: Insufficient documentation

## 2021-12-18 DIAGNOSIS — F209 Schizophrenia, unspecified: Secondary | ICD-10-CM | POA: Diagnosis present

## 2021-12-18 LAB — COMPREHENSIVE METABOLIC PANEL
ALT: 24 U/L (ref 0–44)
AST: 42 U/L — ABNORMAL HIGH (ref 15–41)
Albumin: 4.6 g/dL (ref 3.5–5.0)
Alkaline Phosphatase: 71 U/L (ref 38–126)
Anion gap: 10 (ref 5–15)
BUN: 8 mg/dL (ref 6–20)
CO2: 23 mmol/L (ref 22–32)
Calcium: 10.1 mg/dL (ref 8.9–10.3)
Chloride: 106 mmol/L (ref 98–111)
Creatinine, Ser: 1.03 mg/dL (ref 0.61–1.24)
GFR, Estimated: >60 mL/min (ref >60)
Glucose, Bld: 81 mg/dL (ref 70–99)
Potassium: 3.8 mmol/L (ref 3.5–5.1)
Sodium: 139 mmol/L (ref 135–145)
Total Bilirubin: 0.7 mg/dL (ref 0.3–1.2)
Total Protein: 8.3 g/dL — ABNORMAL HIGH (ref 6.5–8.1)

## 2021-12-18 LAB — CBC
HCT: 45.2 % (ref 39.0–52.0)
Hemoglobin: 15.1 g/dL (ref 13.0–17.0)
MCH: 30.3 pg (ref 26.0–34.0)
MCHC: 33.4 g/dL (ref 30.0–36.0)
MCV: 90.6 fL (ref 80.0–100.0)
Platelets: 336 10*3/uL (ref 150–400)
RBC: 4.99 MIL/uL (ref 4.22–5.81)
RDW: 12.1 % (ref 11.5–15.5)
WBC: 9.9 10*3/uL (ref 4.0–10.5)
nRBC: 0 % (ref 0.0–0.2)

## 2021-12-18 LAB — ETHANOL: Alcohol, Ethyl (B): <10 mg/dL (ref <10)

## 2021-12-18 LAB — SALICYLATE LEVEL: Salicylate Lvl: <7 mg/dL — ABNORMAL LOW (ref 7.0–30.0)

## 2021-12-18 LAB — ACETAMINOPHEN LEVEL: Acetaminophen (Tylenol), Serum: <10 ug/mL — ABNORMAL LOW (ref 10–30)

## 2021-12-18 MED ORDER — HYDROXYZINE HCL 25 MG PO TABS: 50.0000 mg | ORAL_TABLET | Freq: Three times a day (TID) | ORAL | Status: DC | PRN

## 2021-12-18 MED ORDER — RISPERIDONE 0.5 MG PO TBDP: 2.0000 mg | ORAL_TABLET | Freq: Two times a day (BID) | ORAL | Status: DC

## 2021-12-18 MED ORDER — ZIPRASIDONE MESYLATE 20 MG IM SOLR
20.0000 mg | Freq: Once | INTRAMUSCULAR | Status: AC
Start: 2021-12-18 — End: 2021-12-18
  Administered 2021-12-18: 20 mg via INTRAMUSCULAR

## 2021-12-18 MED ORDER — NICOTINE POLACRILEX 2 MG MT GUM: 2.0000 mg | CHEWING_GUM | OROMUCOSAL | Status: DC | PRN

## 2021-12-18 MED ORDER — TRAZODONE HCL 50 MG PO TABS: 50.0000 mg | ORAL_TABLET | Freq: Every evening | ORAL | Status: DC | PRN

## 2021-12-18 MED ORDER — BENZTROPINE MESYLATE 1 MG PO TABS: 0.5000 mg | ORAL_TABLET | Freq: Every day | ORAL | Status: DC

## 2021-12-18 NOTE — ED Provider Notes (Signed)
[facility] Provider Note   Event Date/Time   First MD Initiated Contact with Patient 12/18/21 1555     (approximate) History  Psychiatric Evaluation  HPI Ronald [name] is a 26 y.o. male with a past medical history of schizophrenia as well as alcohol and polysubstance abuse who presents in a "catatonic state" under IVC after family noticed patient has not been compliant with his medications and that he was having auditory and visual hallucinations that he was responding to.  Family denies patient having any history of suicidal/homicidal ideation.  Further history and review of systems are unable to be obtained at this time secondary to mental mental status   Physical Exam  Triage Vital Signs: ED Triage Vitals [12/18/21 1322]  Enc Vitals Group     BP (!) 130/91     Pulse Rate (!) 114     Resp 16     Temp 98.7 F (37.1 C)     Temp Source Oral     SpO2 99 %     Weight      Height      Head Circumference      Peak Flow      Pain Score      Pain Loc      Pain Edu?      Excl. in GC?    Most recent vital signs: Vitals:   12/18/21 1322 12/18/21 1333  BP: (!) 130/91 (!) 149/131  Pulse: (!) 114 (!) 104  Resp: 16   Temp: 98.7 F (37.1 C)   SpO2: 99% 100%   General: Awake, blinking but not responding to questioning CV:  Good peripheral perfusion.  Resp:  Normal effort.  Abd:  No distention.  Other:  Young adult African-American male laying in bed in no acute distress but uncooperative with exam ED Results / Procedures / Treatments  Labs (all labs ordered are listed, but only abnormal results are displayed) Labs Reviewed  COMPREHENSIVE METABOLIC PANEL - Abnormal; Notable for the following components:      Result Value   Total Protein 8.3 (*)    AST 42 (*)    All other components within normal limits  SALICYLATE LEVEL - Abnormal; Notable for the following components:   Salicylate Lvl <7.0 (*)    All other components within normal limits   ACETAMINOPHEN LEVEL - Abnormal; Notable for the following components:   Acetaminophen (Tylenol), Serum <10 (*)    All other components within normal limits  RESP PANEL BY RT-PCR (FLU A&B, COVID) ARPGX2  ETHANOL  CBC  URINE DRUG SCREEN, QUALITATIVE ([facility] ONLY)   PROCEDURES: Critical Care performed: No Procedures MEDICATIONS ORDERED IN ED: Medications  benztropine (COGENTIN) tablet 0.5 mg (has no administration in time range)  hydrOXYzine (ATARAX) tablet 50 mg (has no administration in time range)  nicotine polacrilex (NICORETTE) gum 2 mg (has no administration in time range)  risperiDONE (RISPERDAL M-TABS) disintegrating tablet 2 mg (has no administration in time range)  traZODone (DESYREL) tablet 50 mg (has no administration in time range)  ziprasidone (GEODON) injection 20 mg (20 mg Intramuscular Given 12/18/21 1739)   IMPRESSION / MDM / ASSESSMENT AND PLAN / ED COURSE  I reviewed the triage vital signs and the nursing notes.                             The patient is on the cardiac monitor to evaluate for evidence of arrhythmia and/or  significant heart rate changes. Patient's presentation is most consistent with acute presentation with potential threat to life or bodily function. Patient presents under IVC for hallucinations/delusions. Thoughts are disorganized. No history of prior suicide attempt, and no SI or HI at this time. Clinically w/ no overt toxidrome, low suspicion for ingestion given hx and exam Thoughts unlikely 2/2 anemia, hypothyroidism, infection, or ICH. Patients decision making capacity is compromised and they are unable to perform all ADLs (additionally they are without appropriate caretakers to assist through this deficit).  Consult: Psychiatry to evaluate patient for grave disability Disposition: Pending psychiatric evaluation  Care of this patient will be signed out the oncoming physician.  All pertinent patient formation is conveyed and all questions  answered.  All further care and disposition decisions will be made by the oncoming physician.   FINAL CLINICAL IMPRESSION(S) / ED DIAGNOSES   Final diagnoses:  Acute psychosis (HCC)   Rx / DC Orders   ED Discharge Orders     None      Note:  This document was prepared using Dragon voice recognition software and may include unintentional dictation errors.   Merwyn Katos, MD 12/18/21 [phone]

## 2021-12-18 NOTE — Consult Note (Signed)
[facility] Face-to-Face Psychiatry Consult   Reason for Consult:  AMS Referring Physician:  Bradler Patient Identification: Ronald [name] MRN:  [id number] Principal Diagnosis: Acute psychosis (HCC) Diagnosis:  Principal Problem:   Acute psychosis (HCC) Active Problems:   Schizophrenia (HCC)   Total Time spent with patient: 30 minutes  Subjective: "I don't know you."  Ronald [name] is a 26 y.o. male patient admitted with AMS, .  HPI:  Patient presents to ED with his mother, who states that patient has not been compliant with his psychiatric meds. Family has been out of their home d/t mold for 7 months. They are currently living with mother's brother. Mother explains that patient became more paranoid about 8 days ago. He stopped taking meds about a month ago and she states she has not received help from [facility] to get an ACT team. She states psychotic behaviors/paranoia have gotten worse today. Mother states he likely is using illicit substances. No UDS as of this time. Mother reports patient has had very poor sleep and appetite.   On evaluation, patient has increased psychomotor activity. He is very disorganized in expression of thought. Appears to be responding to internal stimuli. He laughs inappropriately. Nonsensical, states "I'm sorry I disagree."  He does not answer questions regarding suicidal thoughts.  Patient placed under IVC for safety d/t his psychosis.   Will need inpatient psychiatric hospitalization    Past Psychiatric History: schizophrenia, drug induced-psychosis  Risk to Self:   Risk to Others:   Prior Inpatient Therapy:   Prior Outpatient Therapy:    Past Medical History:  Past Medical History:  Diagnosis Date   Asthma    History reviewed. No pertinent surgical history. Family History: History reviewed. No pertinent family history. Family Psychiatric  History:  Social History:  Social History   Substance and Sexual Activity  Alcohol Use Yes   Alcohol/week: 2.0  standard drinks of alcohol   Types: 2 Cans of beer per week   Comment: every other week     Social History   Substance and Sexual Activity  Drug Use Yes   Types: Marijuana   Comment: weekly    Social History   Socioeconomic History   Marital status: Single    Spouse name: Not on file   Number of children: Not on file   Years of education: Not on file   Highest education level: Not on file  Occupational History   Not on file  Tobacco Use   Smoking status: Every Day    Packs/day: 1.00    Types: Cigarettes   Smokeless tobacco: Never  Vaping Use   Vaping Use: Never used  Substance and Sexual Activity   Alcohol use: Yes    Alcohol/week: 2.0 standard drinks of alcohol    Types: 2 Cans of beer per week    Comment: every other week   Drug use: Yes    Types: Marijuana    Comment: weekly   Sexual activity: Yes    Partners: Female    Birth control/protection: Condom  Other Topics Concern   Not on file  Social History Narrative   ** Merged History Encounter **       Social Determinants of Health   Financial Resource Strain: Not on file  Food Insecurity: Not on file  Transportation Needs: Not on file  Physical Activity: Not on file  Stress: Not on file  Social Connections: Not on file   Additional Social History:    Allergies:  No Known Allergies  Labs:  Results for orders placed or performed during the hospital encounter of 12/18/21 (from the past 48 hour(s))  Comprehensive metabolic panel     Status: Abnormal   Collection Time: 12/18/21  1:39 PM  Result Value Ref Range   Sodium 139 135 - 145 mmol/L   Potassium 3.8 3.5 - 5.1 mmol/L   Chloride 106 98 - 111 mmol/L   CO2 23 22 - 32 mmol/L   Glucose, Bld 81 70 - 99 mg/dL    Comment: Glucose reference range applies only to samples taken after fasting for at least 8 hours.   BUN 8 6 - 20 mg/dL   Creatinine, Ser 5.36 0.61 - 1.24 mg/dL   Calcium 14.4 8.9 - 31.5 mg/dL   Total Protein 8.3 (H) 6.5 - 8.1 g/dL    Albumin 4.6 3.5 - 5.0 g/dL   AST 42 (H) 15 - 41 U/L   ALT 24 0 - 44 U/L   Alkaline Phosphatase 71 38 - 126 U/L   Total Bilirubin 0.7 0.3 - 1.2 mg/dL   GFR, Estimated >40 >08 mL/min    Comment: (NOTE) Calculated using the CKD-EPI Creatinine Equation (2021)    Anion gap 10 5 - 15    Comment: Performed at [facility], [address] [place], [place] [address]  Ethanol     Status: None   Collection Time: 12/18/21  1:39 PM  Result Value Ref Range   Alcohol, Ethyl (B) <10 <10 mg/dL    Comment: (NOTE) Lowest detectable limit for serum alcohol is 10 mg/dL.  For medical purposes only. Performed at [facility], [address] [place], [place] [address]   Salicylate level     Status: Abnormal   Collection Time: 12/18/21  1:39 PM  Result Value Ref Range   Salicylate Lvl <7.0 (L) 7.0 - 30.0 mg/dL    Comment: Performed at [facility], [address] [place], [place] [address]  Acetaminophen level     Status: Abnormal   Collection Time: 12/18/21  1:39 PM  Result Value Ref Range   Acetaminophen (Tylenol), Serum <10 (L) 10 - 30 ug/mL    Comment: (NOTE) Therapeutic concentrations vary significantly. A range of 10-30 ug/mL  may be an effective concentration for many patients. However, some  are best treated at concentrations outside of this range. Acetaminophen concentrations >150 ug/mL at 4 hours after ingestion  and >50 ug/mL at 12 hours after ingestion are often associated with  toxic reactions.  Performed at [facility], [address] [place], [place] [address]   cbc     Status: None   Collection Time: 12/18/21  1:39 PM  Result Value Ref Range   WBC 9.9 4.0 - 10.5 K/uL   RBC 4.99 4.22 - 5.81 MIL/uL   Hemoglobin 15.1 13.0 - 17.0 g/dL   HCT 83.3 82.5 - 05.3 %   MCV 90.6 80.0 - 100.0 fL   MCH 30.3 26.0 - 34.0 pg   MCHC 33.4 30.0 - 36.0 g/dL   RDW 97.6 73.4 - 19.3 %   Platelets 336 150 - 400 K/uL   nRBC 0.0 0.0 - 0.2 %    Comment:  Performed at [facility], [address] [place], [place] [address]    Current Facility-Administered Medications  Medication Dose Route Frequency Provider Last Rate Last Admin   benztropine (COGENTIN) tablet 0.5 mg  0.5 mg Oral QHS Vanetta Mulders, NP       hydrOXYzine (ATARAX) tablet  50 mg  50 mg Oral TID PRN Vanetta Mulders, NP       nicotine polacrilex (NICORETTE) gum 2 mg  2 mg Oral PRN Vanetta Mulders, NP       risperiDONE (RISPERDAL M-TABS) disintegrating tablet 2 mg  2 mg Oral BID Gabriel Cirri F, NP       traZODone (DESYREL) tablet 50 mg  50 mg Oral QHS PRN Vanetta Mulders, NP       Current Outpatient Medications  Medication Sig Dispense Refill   benztropine (COGENTIN) 0.5 MG tablet Take 1 tablet (0.5 mg total) by mouth at bedtime. 10 tablet 0   hydrOXYzine (ATARAX) 50 MG tablet Take 1 tablet (50 mg total) by mouth 3 (three) times daily as needed for anxiety (agitation). 30 tablet 0   nicotine polacrilex (NICORETTE) 2 MG gum Take 1 each (2 mg total) by mouth as needed for smoking cessation. 30 tablet 0   risperiDONE (RISPERDAL M-TABS) 2 MG disintegrating tablet Take 1 tablet (2 mg total) by mouth 2 (two) times daily. 20 tablet 0   traZODone (DESYREL) 50 MG tablet Take 1 tablet (50 mg total) by mouth at bedtime as needed for sleep. 10 tablet 0    Musculoskeletal: Strength & Muscle Tone: within normal limits Gait & Station: normal Patient leans: N/A            Psychiatric Specialty Exam:  Presentation  General Appearance: Casual  Eye Contact:Fleeting  Speech:Slow  Speech Volume:Decreased  Handedness:Right   Mood and Affect  Mood:Anxious  Affect:Constricted   Thought Process  Thought Processes:Goal Directed  Descriptions of Associations:Circumstantial  Orientation:Full (Time, Place and Person)  Thought Content:Paranoid Ideation  History of Schizophrenia/Schizoaffective disorder:Yes  Duration of Psychotic  Symptoms:Greater than six months  Hallucinations:No data recorded Ideas of Reference:None  Suicidal Thoughts:No data recorded Homicidal Thoughts:No data recorded  Sensorium  Memory:Immediate Fair; Remote Good  Judgment:Fair  Insight:Fair   Executive Functions  Concentration:Fair  Attention Span:Fair  Recall:Fair  Fund of Knowledge:Fair  Language:Fair   Psychomotor Activity  Psychomotor Activity:No data recorded  Assets  Assets:Communication Skills; Housing; Desire for Improvement   Sleep  Sleep:No data recorded  Physical Exam: Physical Exam Vitals and nursing note reviewed.  HENT:     Head: Normocephalic.  Eyes:     General:        Right eye: No discharge.        Left eye: No discharge.  Pulmonary:     Effort: Pulmonary effort is normal.  Musculoskeletal:        General: Normal range of motion.     Cervical back: Normal range of motion.  Skin:    General: Skin is dry.  Neurological:     Mental Status: He is alert.  Psychiatric:        Attention and Perception: He is inattentive.        Mood and Affect: Affect is inappropriate.        Speech: Speech is delayed.        Behavior: Behavior is hyperactive. Behavior is cooperative.        Thought Content: Thought content is paranoid and delusional. Thought content does not include homicidal or suicidal ideation.        Cognition and Memory: Cognition is impaired.        Judgment: Judgment is impulsive.    Review of Systems  Constitutional: Negative.   HENT: Negative.    Respiratory: Negative.    Skin: Negative.   Psychiatric/Behavioral:  Positive for hallucinations and substance abuse (hx of; no UDS yet). Negative for depression, memory loss and suicidal ideas. The patient is nervous/anxious and has insomnia.    Blood pressure (!) 149/131, pulse (!) 104, temperature 98.7 F (37.1 C), temperature source Oral, resp. rate 16, SpO2 100 %. There is no height or weight on file to calculate  BMI.  Treatment Plan Summary: Daily contact with patient to assess and evaluate symptoms and progress in treatment, Medication management, and Plan Refer for inpatient psychiatric admission  Disposition: Recommend psychiatric Inpatient admission when medically cleared.  Vanetta Mulders, NP 12/18/2021 5:07 PM

## 2021-12-18 NOTE — ED Provider Triage Note (Signed)
Emergency Medicine Provider Triage Evaluation Note  Ronald [name] , a 26 y.o. male  was evaluated in triage.  Pt complains of altered mental status. Normally is talking, walking on his own.  Mother states that her brother called to tell her that he was hearing voices and talking to people not there.  Is suppose to be taking medication but will not.  Hx of psychosis, schizophrenia, polysubstance abuse.    Review of Systems  Positive: Alcohol, + street drugs Negative:   Physical Exam  BP (!) 130/91 (BP Location: Right Arm)   Pulse (!) 114   Temp 98.7 F (37.1 C) (Oral)   Resp 16   SpO2 99%  Gen:   Awake, nonverbal.   Resp:  Normal effort  Lungs clear. MSK:   Moves extremities without difficulty  Other:    Medical Decision Making  Medically screening exam initiated at 1:33 PM.  Appropriate orders placed.  Daisy Ritchey was informed that the remainder of the evaluation will be completed by another provider, this initial triage assessment does not replace that evaluation, and the importance of remaining in the ED until their evaluation is complete.     Tommi Rumps, PA-C 12/18/21 1340

## 2021-12-18 NOTE — BH Assessment (Signed)
Referral information for Psychiatric Hospitalization faxed to:   [facility] ([phone]-or- [phone]),   [facility] ([phone]---[phone]),  [facility] ([phone]),   [facility] ([phone] -or- [phone]),   [facility] ([phone])  [facility] ([phone]).  [facility] ([phone])  

## 2021-12-18 NOTE — ED Triage Notes (Signed)
Pt to ED via POV from home. Pt in almost a catatonic state in triage and nonverbal with this RN.   Family brought pt in due to AMS. Family states pt is A&Ox4 and ambulatory. Pt with hx schizophrenia and has not been med compliant per family. Family states they noticed pt was having visual and auditory hallucinations. Family reports pt has hx etoh and substance abuse. Family denies hx SI/HI.

## 2021-12-18 NOTE — ED Notes (Signed)
IVC  CONSULT  DONE  PENDING  PLACEMENT 

## 2021-12-18 NOTE — ED Notes (Signed)
Unable to [employer] coach patient into dressing out. Mother at bedside trying to get patient to dress out with no success. Pt with repetitive movements and unable to focus on one train of thought. IM medications ordered and administered by this RN. Pt took medication voluntarily with Building control surveyor at bedside.

## 2021-12-18 NOTE — BH Assessment (Signed)
Comprehensive Clinical Assessment (CCA) Note  12/18/2021 [facility] [id number]  Ronald [name] is a 26 year old male who presents to [facility] ED, accompanied by his mother Ronald [name] [phone]). Patient's mother reports patient has not been compliant with his medications for the last month. She shared that patient has not slept in 3 days. Patient has exhibited bizarre and paranoid behaviors for the last 8 days, per mother's report.   On assessment, pt displayed increased restlessness with disorganized/incoherent thought patterns. Patient unable to formulate a complete coherent sentence while speaking with this Clinical research associate. Collateral information all gathered from patient's mother.   Chief Complaint:  Chief Complaint  Patient presents with   Psychiatric Evaluation   Visit Diagnosis: Acute Psychosis    CCA Screening, Triage and Referral (STR)  Patient Reported Information How did you hear about [place]? Self  Referral name: No data recorded Referral phone number: No data recorded  Whom do you see for routine medical problems? No data recorded Practice/Facility Name: No data recorded Practice/Facility Phone Number: No data recorded Name of Contact: No data recorded Contact Number: No data recorded Contact Fax Number: No data recorded Prescriber Name: No data recorded Prescriber Address (if known): No data recorded  What Is the Reason for Your Visit/Call Today? Patient's mother reports pt actively psychotic  How Long Has This Been Causing You Problems? > than 6 months  What Do You Feel Would Help You the Most Today? Medication(s)   Have You Recently Been in Any Inpatient Treatment (Hospital/Detox/Crisis Center/28-Day Program)? No data recorded Name/Location of Program/Hospital:No data recorded How Long Were You There? No data recorded When Were You Discharged? No data recorded  Have You Ever Received Services From [facility] Before? No data recorded Who Do You See at [facility]? No data recorded  Have You Recently Had Any Thoughts About Hurting Yourself? No  Are You Planning to Commit Suicide/Harm Yourself At This time? No   Have you Recently Had Thoughts About Hurting Someone Karolee Ohs? No  Explanation: No data recorded  Have You Used Any Alcohol or Drugs in the Past 24 Hours? No  How Long Ago Did You Use Drugs or Alcohol? No data recorded What Did You Use and How Much? No data recorded  Do You Currently Have a Therapist/Psychiatrist? No  Name of Therapist/Psychiatrist: No data recorded  Have You Been Recently Discharged From Any Office Practice or Programs? No  Explanation of Discharge From Practice/Program: No data recorded    CCA Screening Triage Referral Assessment Type of Contact: Face-to-Face  Is this Initial or Reassessment? No data recorded Date Telepsych consult ordered in [facility]:  No data recorded Time Telepsych consult ordered in [facility]:  No data recorded  Patient Reported Information Reviewed? No data recorded Patient Left Without Being Seen? No data recorded Reason for Not Completing Assessment: Pt was not oriented; unable to answer assessment questions.   Collateral Involvement: Ronald [name] (Mother)   [phone]   Does Patient Have a Court Appointed Legal Guardian? No data recorded Name and Contact of Legal Guardian: No data recorded If Minor and Not Living with Parent(s), Who has Custody? n/a  Is CPS involved or ever been involved? Never  Is APS involved or ever been involved? Never   Patient Determined To Be At Risk for Harm To Self or Others Based on Review of Patient Reported Information or Presenting Complaint? No  Method: No data recorded Availability of Means: No data recorded Intent: No data recorded Notification Required: No data recorded Additional Information for  Danger to Others Potential: No data recorded Additional Comments for Danger to Others Potential: No data recorded Are There Guns or Other Weapons  in Your Home? No data recorded Types of Guns/Weapons: No data recorded Are These Weapons Safely Secured?                            No data recorded Who Could Verify You Are Able To Have These Secured: No data recorded Do You Have any Outstanding Charges, Pending Court Dates, Parole/Probation? No data recorded Contacted To Inform of Risk of Harm To Self or Others: Family/Significant Other:   Location of Assessment: [facility] ED   Does Patient Present under Involuntary Commitment? No  IVC Papers Initial File Date: 10/29/21   [place] of Residence: [place]   Patient Currently Receiving the Following Services: Medication Management   Determination of Need: Emergent (2 hours)   Options For Referral: Therapeutic Triage Services     CCA Biopsychosocial Intake/Chief Complaint:  No data recorded Current Symptoms/Problems: No data recorded  Patient Reported Schizophrenia/Schizoaffective Diagnosis in Past: Yes   Strengths: Supportive mother  Preferences: No data recorded Abilities: No data recorded  Type of Services Patient Feels are Needed: No data recorded  Initial Clinical Notes/Concerns: No data recorded  Mental Health Symptoms Depression:   None   Duration of Depressive symptoms: No data recorded  Mania:   Racing thoughts; Change in energy/activity   Anxiety:    Tension; Worrying; Restlessness   Psychosis:   Grossly disorganized speech; Hallucinations; Grossly disorganized or catatonic behavior; Affective flattening/alogia/avolition   Duration of Psychotic symptoms:  Greater than six months   Trauma:   N/A   Obsessions:   N/A   Compulsions:   N/A   Inattention:   N/A   Hyperactivity/Impulsivity:   Feeling of restlessness   Oppositional/Defiant Behaviors:   None   Emotional Irregularity:   Mood lability   Other Mood/Personality Symptoms:   n/a    Mental Status Exam Appearance and self-care  Stature:   Average   Weight:   Average  weight   Clothing:   Age-appropriate   Grooming:   Normal   Cosmetic use:   None   Posture/gait:   Normal   Motor activity:   Agitated; Restless   Sensorium  Attention:   Confused   Concentration:   Anxiety interferes; Focuses on irrelevancies   Orientation:   -- (Not oriented)   Recall/memory:   Defective in Recent   Affect and Mood  Affect:   Anxious   Mood:   Anxious; Hypomania   Relating  Eye contact:   Avoided   Facial expression:   Anxious   Attitude toward examiner:   Resistant; Sarcastic; Irritable   Thought and Language  Speech flow:  Blocked   Thought content:   Appropriate to Mood and Circumstances   Preoccupation:   None   Hallucinations:   Auditory   Organization:  No data recorded  [employer] of Knowledge:   Fair   Intelligence:   Average   Abstraction:   Overly abstract   Judgement:   Poor   Reality Testing:   Unaware   Insight:   Poor   Decision Making:   Confused   Social Functioning  Social Maturity:   Irresponsible   Social Judgement:   Impropriety   Stress  Stressors:   Transitions; Housing   Coping Ability:   Normal   Skill Deficits:   None  Supports:   Family     Religion: Religion/Spirituality Are You A Religious Person?:  (UTA) How Might This Affect Treatment?: UTA  Leisure/Recreation: Leisure / Recreation Do You Have Hobbies?: No  Exercise/Diet: Exercise/Diet Do You Exercise?: No Have You Gained or Lost A Significant Amount of Weight in the Past Six Months?: No Do You Follow a Special Diet?: No Do You Have Any Trouble Sleeping?: No   CCA Employment/Education Employment/Work Situation: Employment / Work Situation Employment Situation: Unemployed Patient's Job has Been Impacted by Current Illness: No Has Patient ever Been in Equities trader?: No  Education: Education Is Patient Currently Attending School?: No Last Grade Completed:  (UTA) Did You  Attend College?:  (UTA) Did You Have An Individualized Education Program (IIEP):  (UTA) Did You Have Any Difficulty At School?:  (UTA) Patient's Education Has Been Impacted by Current Illness:  (UTA)   CCA Family/Childhood History Family and Relationship History: Family history Marital status: Single Does patient have children?: No  Childhood History:  Childhood History By whom was/is the patient raised?: Mother/father and step-parent Did patient suffer any verbal/emotional/physical/sexual abuse as a child?: No Did patient suffer from severe childhood neglect?: No Has patient ever been sexually abused/assaulted/raped as an adolescent or adult?: No Was the patient ever a victim of a crime or a disaster?: No Witnessed domestic violence?: No Has patient been affected by domestic violence as an adult?: No  Child/Adolescent Assessment: N/A     CCA Substance Use Alcohol/Drug Use: Alcohol / Drug Use Pain Medications: See MAR Prescriptions: See MAR Over the Counter: See MAR History of alcohol / drug use?: Yes Longest period of sobriety (when/how long): UTA Withdrawal Symptoms: None Substance #1 Name of Substance 1: Alcohol (per last assessment) 1 - Age of First Use: UTA 1 - Amount (size/oz): UTA 1 - Frequency: UTA 1 - Duration: UTA 1 - Last Use / Amount: UTA 1 - Method of Aquiring: UTA 1- Route of Use: UTA       ASAM's:  Six Dimensions of Multidimensional Assessment  Dimension 1:  Acute Intoxication and/or Withdrawal Potential:      Dimension 2:  Biomedical Conditions and Complications:      Dimension 3:  Emotional, Behavioral, or Cognitive Conditions and Complications:     Dimension 4:  Readiness to Change:     Dimension 5:  Relapse, Continued use, or Continued Problem Potential:     Dimension 6:  Recovery/Living Environment:     ASAM Severity Score:    ASAM Recommended Level of Treatment:     Substance use Disorder (SUD)    Recommendations for  Services/Supports/Treatments:    DSM5 Diagnoses: Patient Active Problem List   Diagnosis Date Noted   Schizophrenia (HCC) 11/02/2021   Psychosis (HCC) 11/01/2021   Abnormal behavior    Acute psychosis (HCC) 10/02/2021   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

## 2021-12-19 LAB — RESP PANEL BY RT-PCR (FLU A&B, COVID) ARPGX2
Influenza A by PCR: NEGATIVE
Influenza B by PCR: NEGATIVE
SARS Coronavirus 2 by RT PCR: NEGATIVE

## 2021-12-19 NOTE — ED Notes (Signed)
Pt resting on stretcher with lights off to enhance rest. Eyes closed and even respirations.

## 2021-12-19 NOTE — ED Notes (Signed)
IVC/Accepted to [facility] @ 8am

## 2021-12-19 NOTE — BH Assessment (Signed)
Patient has been accepted to [facility].  Patient assigned to [facility] Accepting physician is Dr. Betti Cruz.  Call report to [phone].  Representative was [place].   ER Staff is aware of it:  Rivka Barbara, ER Secretary  Dr. Katrinka Blazing, ER MD  Dorian, Patient's Nurse     Patient can arrive anytime after 8 AM 12/19/21.  Please be advised that the pt is pending covid results. This Clinical research associate agreed to fax results ASAP.

## 2021-12-19 NOTE — BH Assessment (Signed)
Covid results faxed to [facility].

## 2021-12-19 NOTE — ED Notes (Signed)
[facility] Dept  called  for  transport  to  [employer] [employer]

## 2021-12-19 NOTE — ED Notes (Addendum)
Spoke with [place] from [place] (OV). OV has bed available and is coordinating transportation with Jasmine, TTS for in the morning.

## 2021-12-19 NOTE — ED Notes (Signed)
Report given to Darleen Crocker at [facility]

## 2022-06-11 ENCOUNTER — Other Ambulatory Visit: Payer: Self-pay

## 2022-06-11 ENCOUNTER — Emergency Department
Admission: EM | Admit: 2022-06-11 | Discharge: 2022-06-12 | Disposition: A | Discharge status: Other Acute Inpatient Hospital | Payer: No Typology Code available for payment source | Attending: Emergency Medicine | Admitting: Emergency Medicine

## 2022-06-11 ENCOUNTER — Encounter: Payer: Self-pay | Admitting: *Deleted

## 2022-06-11 DIAGNOSIS — J45909 Unspecified asthma, uncomplicated: Secondary | ICD-10-CM | POA: Insufficient documentation

## 2022-06-11 DIAGNOSIS — Z20822 Contact with and (suspected) exposure to covid-19: Secondary | ICD-10-CM | POA: Insufficient documentation

## 2022-06-11 DIAGNOSIS — F209 Schizophrenia, unspecified: Secondary | ICD-10-CM | POA: Diagnosis present

## 2022-06-11 DIAGNOSIS — F29 Unspecified psychosis not due to a substance or known physiological condition: Secondary | ICD-10-CM | POA: Insufficient documentation

## 2022-06-11 LAB — CBC
HCT: 45.7 % (ref 39.0–52.0)
Hemoglobin: 15 g/dL (ref 13.0–17.0)
MCH: 29.4 pg (ref 26.0–34.0)
MCHC: 32.8 g/dL (ref 30.0–36.0)
MCV: 89.4 fL (ref 80.0–100.0)
Platelets: 330 10*3/uL (ref 150–400)
RBC: 5.11 MIL/uL (ref 4.22–5.81)
RDW: 12.6 % (ref 11.5–15.5)
WBC: 7.1 10*3/uL (ref 4.0–10.5)
nRBC: 0.3 % — ABNORMAL HIGH (ref 0.0–0.2)

## 2022-06-11 LAB — COMPREHENSIVE METABOLIC PANEL
ALT: 20 U/L (ref 0–44)
AST: 24 U/L (ref 15–41)
Albumin: 4.3 g/dL (ref 3.5–5.0)
Alkaline Phosphatase: 61 U/L (ref 38–126)
Anion gap: 9 (ref 5–15)
BUN: 20 mg/dL (ref 6–20)
CO2: 26 mmol/L (ref 22–32)
Calcium: 9.5 mg/dL (ref 8.9–10.3)
Chloride: 101 mmol/L (ref 98–111)
Creatinine, Ser: 1.06 mg/dL (ref 0.61–1.24)
GFR, Estimated: >60 mL/min (ref >60)
Glucose, Bld: 136 mg/dL — ABNORMAL HIGH (ref 70–99)
Potassium: 3.2 mmol/L — ABNORMAL LOW (ref 3.5–5.1)
Sodium: 136 mmol/L (ref 135–145)
Total Bilirubin: 1.3 mg/dL — ABNORMAL HIGH (ref 0.3–1.2)
Total Protein: 8 g/dL (ref 6.5–8.1)

## 2022-06-11 LAB — ETHANOL: Alcohol, Ethyl (B): <10 mg/dL (ref <10)

## 2022-06-11 LAB — RESP PANEL BY RT-PCR (RSV, FLU A&B, COVID)  RVPGX2
Influenza A by PCR: NEGATIVE
Influenza B by PCR: NEGATIVE
Resp Syncytial Virus by PCR: NEGATIVE
SARS Coronavirus 2 by RT PCR: NEGATIVE

## 2022-06-11 LAB — ACETAMINOPHEN LEVEL: Acetaminophen (Tylenol), Serum: <10 ug/mL — ABNORMAL LOW (ref 10–30)

## 2022-06-11 LAB — SALICYLATE LEVEL: Salicylate Lvl: <7 mg/dL — ABNORMAL LOW (ref 7.0–30.0)

## 2022-06-11 MED ORDER — TRAZODONE HCL 50 MG PO TABS
50.0000 mg | ORAL_TABLET | Freq: Every evening | ORAL | Status: DC | PRN
  Administered 2022-06-11: 50 mg via ORAL
  Filled 2022-06-11: qty 1

## 2022-06-11 MED ORDER — BENZTROPINE MESYLATE 1 MG PO TABS
0.5000 mg | ORAL_TABLET | Freq: Every day | ORAL | Status: DC
  Administered 2022-06-11: 0.5 mg via ORAL
  Filled 2022-06-11: qty 1

## 2022-06-11 MED ORDER — RISPERIDONE 0.5 MG PO TBDP
2.0000 mg | ORAL_TABLET | Freq: Two times a day (BID) | ORAL | Status: DC
  Administered 2022-06-11 – 2022-06-12 (×2): 2 mg via ORAL
  Filled 2022-06-11 (×2): qty 4

## 2022-06-11 MED ORDER — HYDROXYZINE HCL 25 MG PO TABS
50.0000 mg | ORAL_TABLET | Freq: Three times a day (TID) | ORAL | Status: DC | PRN
  Administered 2022-06-11: 50 mg via ORAL
  Filled 2022-06-11: qty 2

## 2022-06-11 NOTE — ED Notes (Signed)
Pt was very hesitant to take his medication and a good amount of explanation and education was required but pt eventually took meds.

## 2022-06-11 NOTE — BH Assessment (Signed)
Pt was unable to participate in the assessment due to pt being asleep. Psych team to follow up when pt is willing to engage.

## 2022-06-11 NOTE — ED Notes (Signed)
Pt educated on neded for urine sample. Denies need to urinate at this time and reports he will notify nurse when he is able to

## 2022-06-11 NOTE — ED Notes (Addendum)
Camo jacket Black pants White sock Gray sock plaid shirt Cigarettes White slides Black belt Blue sweat pants Plaid boxers

## 2022-06-11 NOTE — ED Provider Notes (Signed)
[facility] Provider Note    Event Date/Time   First MD Initiated Contact with Patient 06/11/22 2023     (approximate)   History   Chief Complaint Hallucinations   HPI  Ronald [name] is a 27 y.o. male with past medical history of schizophrenia, polysubstance use, and asthma who presents to the ED for hallucinations.  Patient was reportedly brought in by his mother, who stated in triage that he had not been taking his medications and has been hallucinating.  Patient currently calm and cooperative, denies hallucinations as well as any suicidal or homicidal ideation.  He also denies substance abuse and denies any medical complaints at this time.  When asked if he is hallucinating, patient states "I am doing good."  He does admit that he has not been taking his medications.     Physical Exam   Triage Vital Signs: ED Triage Vitals  Enc Vitals Group     BP 06/11/22 2000 117/75     Pulse Rate 06/11/22 2000 (!) 107     Resp 06/11/22 2000 20     Temp 06/11/22 2000 97.9 F (36.6 C)     Temp Source 06/11/22 2000 Oral     SpO2 06/11/22 2000 97 %     Weight 06/11/22 2001 141 lb 1.5 oz (64 kg)     Height 06/11/22 2001 5\' 11"  (1.803 m)     Head Circumference --      Peak Flow --      Pain Score 06/11/22 2001 0     Pain Loc --      Pain Edu? --      Excl. in [place]? --     Most recent vital signs: Vitals:   06/11/22 2000  BP: 117/75  Pulse: (!) 107  Resp: 20  Temp: 97.9 F (36.6 C)  SpO2: 97%    Constitutional: Alert and oriented. Eyes: Conjunctivae are normal. Head: Atraumatic. Nose: No congestion/rhinnorhea. Mouth/Throat: Mucous membranes are moist.  Cardiovascular: Normal rate, regular rhythm. Grossly normal heart sounds.  2+ radial pulses bilaterally. Respiratory: Normal respiratory effort.  No retractions. Lungs CTAB. Gastrointestinal: Soft and nontender. No distention. Musculoskeletal: No lower extremity tenderness nor edema.  Neurologic:   Normal speech and language. No gross focal neurologic deficits are appreciated.    ED Results / Procedures / Treatments   Labs (all labs ordered are listed, but only abnormal results are displayed) Labs Reviewed  COMPREHENSIVE METABOLIC PANEL - Abnormal; Notable for the following components:      Result Value   Potassium 3.2 (*)    Glucose, Bld 136 (*)    Total Bilirubin 1.3 (*)    All other components within normal limits  SALICYLATE LEVEL - Abnormal; Notable for the following components:   Salicylate Lvl <3.2 (*)    All other components within normal limits  ACETAMINOPHEN LEVEL - Abnormal; Notable for the following components:   Acetaminophen (Tylenol), Serum <10 (*)    All other components within normal limits  CBC - Abnormal; Notable for the following components:   nRBC 0.3 (*)    All other components within normal limits  RESP PANEL BY RT-PCR (RSV, FLU A&B, COVID)  [id number]  ETHANOL  URINE DRUG SCREEN, QUALITATIVE ([facility] ONLY)    PROCEDURES:  Critical Care performed: No  Procedures   MEDICATIONS ORDERED IN ED: Medications - No data to display   IMPRESSION / MDM / [place] / ED COURSE  I reviewed the triage vital  signs and the nursing notes.                              27 y.o. male with past medical history of schizophrenia, polysubstance use, and asthma who presents to the ED due to concerns from his mother that he has not been taking his medications and is currently hallucinating.  Patient's presentation is most consistent with acute presentation with potential threat to life or bodily function.  Differential diagnosis includes, but is not limited to, psychosis, suicidal ideation, homicidal ideation, medication noncompliance, electrolyte abnormality, substance abuse.  Patient nontoxic-appearing and in no acute distress, vital signs are unremarkable.  He is calm and cooperative on my evaluation, denies any threat to himself or others.  We will hold off  on IVC and consult psychiatry for further evaluation.  Patient may be medically cleared as he denies any complaints and screening labs are unremarkable.  No significant anemia, leukocytosis, lecture abnormality, or AKI noted.  Tylenol and salicylate levels are undetectable.  The patient has been placed in psychiatric observation due to the need to provide a safe environment for the patient while obtaining psychiatric consultation and evaluation, as well as ongoing medical and medication management to treat the patient's condition.  The patient has not been placed under full IVC at this time.       FINAL CLINICAL IMPRESSION(S) / ED DIAGNOSES   Final diagnoses:  Schizophrenia, unspecified type ([place])     Rx / DC Orders   ED Discharge Orders     None        Note:  This document was prepared using Dragon voice recognition software and may include unintentional dictation errors.   Blake Divine, MD 06/11/22 2144

## 2022-06-11 NOTE — ED Triage Notes (Addendum)
Pt ambulatory to triage.  Mother with pt and states pt is off medications and is hallucinating.  Pt denies SI or HI.  Pt denies etoh use.  Pt alert.  Hx schizophrenia

## 2022-06-11 NOTE — ED Notes (Signed)
Pt to room, screened by security and this nurse. Pt is jittery and nervous, cooperative. Pt has difficulty answering questions. Denies si, hi, avh. Pt reorts non-compliance with medications and states he is here for nagging.

## 2022-06-11 NOTE — ED Notes (Signed)
Pt did come to room with pocket sized, hand held new testament bible. This was checked by this nurse and security. Allowed to remain with pt

## 2022-06-12 ENCOUNTER — Inpatient Hospital Stay
Admission: AD | Admit: 2022-06-12 | Discharge: 2022-06-18 | DRG: 885 | Disposition: A | Discharge status: Home / Self Care | Payer: No Typology Code available for payment source | Source: Intra-hospital | Attending: Psychiatry | Admitting: Psychiatry

## 2022-06-12 ENCOUNTER — Encounter: Payer: Self-pay | Admitting: Psychiatry

## 2022-06-12 DIAGNOSIS — F203 Undifferentiated schizophrenia: Principal | ICD-10-CM | POA: Diagnosis present

## 2022-06-12 DIAGNOSIS — F209 Schizophrenia, unspecified: Secondary | ICD-10-CM | POA: Diagnosis present

## 2022-06-12 DIAGNOSIS — G4700 Insomnia, unspecified: Secondary | ICD-10-CM | POA: Diagnosis present

## 2022-06-12 DIAGNOSIS — Z20822 Contact with and (suspected) exposure to covid-19: Secondary | ICD-10-CM | POA: Diagnosis present

## 2022-06-12 DIAGNOSIS — F17210 Nicotine dependence, cigarettes, uncomplicated: Secondary | ICD-10-CM | POA: Diagnosis present

## 2022-06-12 DIAGNOSIS — Z91199 Patient's noncompliance with other medical treatment and regimen due to unspecified reason: Secondary | ICD-10-CM | POA: Diagnosis not present

## 2022-06-12 MED ORDER — RISPERIDONE 1 MG PO TBDP
2.0000 mg | ORAL_TABLET | Freq: Two times a day (BID) | ORAL | Status: DC
  Administered 2022-06-12 – 2022-06-13 (×2): 2 mg via ORAL
  Filled 2022-06-12 (×2): qty 2

## 2022-06-12 MED ORDER — BENZTROPINE MESYLATE 1 MG PO TABS
0.5000 mg | ORAL_TABLET | Freq: Two times a day (BID) | ORAL | Status: DC
  Administered 2022-06-12 – 2022-06-14 (×4): 0.5 mg via ORAL
  Filled 2022-06-12 (×4): qty 1

## 2022-06-12 MED ORDER — MAGNESIUM HYDROXIDE 400 MG/5ML PO SUSP: 30.0000 mL | Freq: Every day | ORAL | Status: DC | PRN

## 2022-06-12 MED ORDER — HYDROXYZINE HCL 50 MG PO TABS: 50.0000 mg | ORAL_TABLET | Freq: Three times a day (TID) | ORAL | Status: DC | PRN

## 2022-06-12 MED ORDER — ALUM & MAG HYDROXIDE-SIMETH 200-200-20 MG/5ML PO SUSP: 30.0000 mL | ORAL | Status: DC | PRN

## 2022-06-12 MED ORDER — ACETAMINOPHEN 325 MG PO TABS: 650.0000 mg | ORAL_TABLET | Freq: Four times a day (QID) | ORAL | Status: DC | PRN

## 2022-06-12 MED ORDER — BENZTROPINE MESYLATE 1 MG PO TABS
0.5000 mg | ORAL_TABLET | Freq: Every day | ORAL | Status: DC
  Administered 2022-06-13: 0.5 mg via ORAL
  Filled 2022-06-12 (×2): qty 1

## 2022-06-12 MED ORDER — RISPERIDONE 1 MG PO TBDP
2.0000 mg | ORAL_TABLET | Freq: Two times a day (BID) | ORAL | Status: DC
  Filled 2022-06-12: qty 2

## 2022-06-12 MED ORDER — TRAZODONE HCL 50 MG PO TABS
50.0000 mg | ORAL_TABLET | Freq: Every evening | ORAL | Status: DC | PRN
  Administered 2022-06-15 – 2022-06-17 (×3): 50 mg via ORAL
  Filled 2022-06-12 (×5): qty 1

## 2022-06-12 NOTE — ED Provider Notes (Signed)
Emergency Medicine Observation Re-evaluation Note  Stalin Tavano is a 27 y.o. male, seen on rounds today.  Pt initially presented to the ED for complaints of Hallucinations  Currently, the patient is is no acute distress. Denies any concerns at this time.  Physical Exam  Blood pressure 117/75, pulse (!) 107, temperature 97.9 F (36.6 C), temperature source Oral, resp. rate 20, height 5\' 11"  (1.803 m), weight 64 kg, SpO2 97 %.  Physical Exam: General: No apparent distress Pulm: Normal WOB Neuro: Moving all extremities Psych: Resting comfortably     ED Course / MDM     I have reviewed the labs performed to date as well as medications administered while in observation.  Recent changes in the last 24 hours include: No acute events overnight.  Plan   Current plan: Patient awaiting psychiatric evaluation and disposition. Patient is not under full IVC at this time.    Lonya Johannesen, Delice Bison, DO 06/12/22 [phone]

## 2022-06-12 NOTE — ED Notes (Signed)
Psych at bedside.

## 2022-06-12 NOTE — Progress Notes (Signed)
Patient ID: Ronald [name], male   DOB: May 07, 1996, 27 y.o.   MRN: [id number] Admission note: Patient is a 27 year old male, presents voluntary per report schizophrenia. Patient is alert and oriented to unit. Patients affect is flat. Patient is cooperative during some assessment questions. Patient did not make eye contact during assessment. Patient had eyes closed during questions.  Patient did start the admission by stating that he is here because of his mom.Patient gave little information to reason for admission stating "it doesn't matter" and "I'm not telling nobody."  Patient denies stressors related to admission at this time.  Patient currently denies SI/HI/AVH. Unit policies explained and verbalized understanding. Q15 minute checks maintained and will continue to monitor.

## 2022-06-12 NOTE — BH Assessment (Addendum)
Patient is to be admitted to [facility] BMU today 06/12/22 by Dr. Weber Cooks.  Attending Physician will be Dr.  Weber Cooks .   Patient has been assigned to room 311, by [place].     ER staff is aware of the admission: Lattie Haw, ER Secretary   Dr. Jacqualine Code, ER MD  Deneise Lever, Patient's Nurse  Seth Bake, Patient Access.

## 2022-06-12 NOTE — Tx Team (Signed)
Initial Treatment Plan 06/12/2022 12:12 PM Ronald [name] UGQ:[id number]    PATIENT STRESSORS: Medication change or noncompliance     PATIENT STRENGTHS: Ability for insight    PATIENT IDENTIFIED PROBLEMS: Patient did not identify any problems                     DISCHARGE CRITERIA:  Improved stabilization in mood, thinking, and/or behavior  PRELIMINARY DISCHARGE PLAN: Placement in alternative living arrangements  PATIENT/FAMILY INVOLVEMENT: This treatment plan has been presented to and reviewed with the patient, Ronald [name], and/or family member. The patient and family have been given the opportunity to ask questions and make suggestions.  Donette Larry, RN 06/12/2022, 12:12 PM

## 2022-06-12 NOTE — Plan of Care (Signed)
  Problem: Safety: Goal: Ability to remain free from injury will improve Outcome: Progressing

## 2022-06-12 NOTE — Progress Notes (Signed)
Isolative to self and rooms. Refused medications this pm. No interaction with peers. Minimal with staff. Nodded head when asked questions.  Patient remains safe on unit with q 15 min checks.

## 2022-06-12 NOTE — BHH Suicide Risk Assessment (Signed)
[facility] Admission Suicide Risk Assessment   Nursing information obtained from:  Patient, Review of record Demographic factors:  Male, Unemployed, Low socioeconomic status Current Mental Status:  NA Loss Factors:  NA Historical Factors:  NA Risk Reduction Factors:  NA  Total Time spent with patient: 45 minutes Principal Problem: Schizophrenia ([place]) Diagnosis:  Principal Problem:   Schizophrenia ([place])  Subjective Data: 27 year old man with a history of schizophrenia and substance use problems presented to the emergency room accompanied by family reports that his hallucinations have been worse and that he has been off of medication.  Patient was somewhat agitated by report in the emergency room but calmed down.  Here in the hospital I went to see him and he is not really able to wake up enough to have a conversation.  So far no aggression and no reports of being suicidal  Continued Clinical Symptoms:  Alcohol Use Disorder Identification Test Final Score (AUDIT): 0 The "Alcohol Use Disorders Identification Test", Guidelines for Use in Primary Care, Second Edition.  World Pharmacologist [facility]). Score between 0-7:  no or low risk or alcohol related problems. Score between 8-15:  moderate risk of alcohol related problems. Score between 16-19:  high risk of alcohol related problems. Score 20 or above:  warrants further diagnostic evaluation for alcohol dependence and treatment.   CLINICAL FACTORS:   Alcohol/Substance Abuse/Dependencies Schizophrenia:   Less than 36 years old   Musculoskeletal: Strength & Muscle Tone: within normal limits Gait & Station: normal Patient leans: N/A  Psychiatric Specialty Exam:  Presentation  General Appearance:  Other (comment) (Sleepy)  Eye Contact: Poor  Speech: Garbled  Speech Volume: Decreased  Handedness: Right   Mood and Affect  Mood: Euthymic  Affect: Congruent   Thought Process  Thought Processes: Goal  Directed  Descriptions of Associations:Loose  Orientation:Full (Time, Place and Person)  Thought Content:Delusions  History of Schizophrenia/Schizoaffective disorder:Yes  Duration of Psychotic Symptoms:Greater than six months  Hallucinations:Hallucinations: None (Denies)  Ideas of Reference:-- (No evidence of delusions noted or reported.)  Suicidal Thoughts:Suicidal Thoughts: No  Homicidal Thoughts:Homicidal Thoughts: No   Sensorium  Memory: Immediate Fair  Judgment: Impaired  Insight: Lacking   Executive Functions  Concentration: Poor  Attention Span: Fair  Recall: [place] of Knowledge: Poor  Language: Fair   Psychomotor Activity  Psychomotor Activity: Psychomotor Activity: Decreased   Assets  Assets: Catering manager; Housing; Social Support; Transportation   Sleep  Sleep: Sleep: Fair    Physical Exam: Physical Exam Vitals and nursing note reviewed.  Constitutional:      Appearance: Normal appearance.  HENT:     Head: Normocephalic and atraumatic.     Mouth/Throat:     Pharynx: Oropharynx is clear.  Eyes:     Pupils: Pupils are equal, round, and reactive to light.  Cardiovascular:     Rate and Rhythm: Normal rate and regular rhythm.  Pulmonary:     Effort: Pulmonary effort is normal.     Breath sounds: Normal breath sounds.  Abdominal:     General: Abdomen is flat.     Palpations: Abdomen is soft.  Musculoskeletal:        General: Normal range of motion.  Skin:    General: Skin is warm and dry.  Neurological:     General: No focal deficit present.     Mental Status: He is alert. Mental status is at baseline.  Psychiatric:        Attention and Perception: He is inattentive.  Mood and Affect: Affect is blunt.        Speech: He is noncommunicative.    Review of Systems  Unable to perform ROS: Psychiatric disorder   Blood pressure 107/74, pulse 87, temperature 98.4 F (36.9 C), temperature source  Oral, resp. rate 15, height 5\' 11"  (1.803 m), weight 64 kg, SpO2 99 %. Body mass index is 19.68 kg/m.   COGNITIVE FEATURES THAT CONTRIBUTE TO RISK:  Thought constriction (tunnel vision)    SUICIDE RISK:   Minimal: No identifiable suicidal ideation.  Patients presenting with no risk factors but with morbid ruminations; may be classified as minimal risk based on the severity of the depressive symptoms  PLAN OF CARE: Continue 15-minute checks.  Restarting psychiatric medicine as was done previously and was effective.  Treatment team tomorrow.  Inclusion in individual and group therapy.  Attempts to reach family.  Ongoing assessment of dangerousness prior to discharge.  I certify that inpatient services furnished can reasonably be expected to improve the patient's condition.   Alethia Berthold, MD 06/12/2022, 2:29 PM

## 2022-06-12 NOTE — BH Assessment (Signed)
Comprehensive Clinical Assessment (CCA) Note  06/12/2022 Ronald [name] [id number]  Ronald [name], 27 year old male who presents to [facility] ED voluntarily for treatment. Per triage note, Pt ambulatory to triage.  Mother with pt and states pt is off medications and is hallucinating.  Pt denies SI or HI.  Pt denies etoh use.  Pt alert.  Hx schizophrenia   During TTS assessment pt presents alert and oriented x 4, restless but cooperative, and mood-congruent with affect. The pt does not appear to be responding to internal or external stimuli. Neither is the pt presenting with any delusional thinking. Pt verified the information provided to triage RN.   Pt identifies his main complaint to be that his mom has been "nagging" him which causes him to be paranoid. Patient states he is non-compliant with his medications due to having no refills. Patient denies using any illicit substances. He reports drinking about a week ago. Pt denies current SI/HI/AH/VH. Patient presented calm and cooperative although he appeared sleepy during the assessment.    Per Barbaraann Share, NP, pt is recommended for inpatient psychiatric admission.  Chief Complaint:  Chief Complaint  Patient presents with   Hallucinations   Visit Diagnosis: Schizophrenia    CCA Screening, Triage and Referral (STR)  Patient Reported Information How did you hear about [place]? Family/Friend  Referral name: No data recorded Referral phone number: No data recorded  Whom do you see for routine medical problems? No data recorded Practice/Facility Name: No data recorded Practice/Facility Phone Number: No data recorded Name of Contact: No data recorded Contact Number: No data recorded Contact Fax Number: No data recorded Prescriber Name: No data recorded Prescriber Address (if known): No data recorded  What Is the Reason for Your Visit/Call Today? Patient's mom reports patient is non-compliant with meds.  How Long Has This Been Causing You Problems?  > than 6 months  What Do You Feel Would Help You the Most Today? Medication(s)   Have You Recently Been in Any Inpatient Treatment (Hospital/Detox/Crisis Center/28-Day Program)? No data recorded Name/Location of Program/Hospital:No data recorded How Long Were You There? No data recorded When Were You Discharged? No data recorded  Have You Ever Received Services From [facility] Before? No data recorded Who Do You See at [facility]? No data recorded  Have You Recently Had Any Thoughts About Hurting Yourself? No  Are You Planning to Commit Suicide/Harm Yourself At This time? No   Have you Recently Had Thoughts About [place]? No  Explanation: No data recorded  Have You Used Any Alcohol or Drugs in the Past 24 Hours? No  How Long Ago Did You Use Drugs or Alcohol? No data recorded What Did You Use and How Much? Substance unknown- UDS not completed   Do You Currently Have a Therapist/Psychiatrist? No  Name of Therapist/Psychiatrist: No data recorded  Have You Been Recently Discharged From Any Office Practice or Programs? No  Explanation of Discharge From Practice/Program: No data recorded    CCA Screening Triage Referral Assessment Type of Contact: Face-to-Face  Is this Initial or Reassessment? No data recorded Date Telepsych consult ordered in [facility]:  No data recorded Time Telepsych consult ordered in [facility]:  No data recorded  Patient Reported Information Reviewed? No data recorded Patient Left Without Being Seen? No data recorded Reason for Not Completing Assessment: Pt was not oriented; unable to answer assessment questions.   Collateral Involvement: Ronald [name] (Mother)   [phone]   Does Patient Have a Court Appointed Legal Guardian? No  data recorded Name and Contact of Legal Guardian: No data recorded If Minor and Not Living with Parent(s), Who has Custody? n/a  Is CPS involved or ever been involved? Never  Is APS involved or ever been  involved? Never   Patient Determined To Be At Risk for Harm To Self or Others Based on Review of Patient Reported Information or Presenting Complaint? No  Method: No data recorded Availability of Means: No data recorded Intent: No data recorded Notification Required: No data recorded Additional Information for Danger to Others Potential: No data recorded Additional Comments for Danger to Others Potential: No data recorded Are There Guns or Other Weapons in Your Home? No data recorded Types of Guns/Weapons: No data recorded Are These Weapons Safely Secured?                            No data recorded Who Could Verify You Are Able To Have These Secured: No data recorded Do You Have any Outstanding Charges, Pending Court Dates, Parole/Probation? No data recorded Contacted To Inform of Risk of Harm To Self or Others: Family/Significant Other:   Location of Assessment: [facility] ED   Does Patient Present under Involuntary Commitment? No  IVC Papers Initial File Date: 10/29/21   [place] of Residence: [place]   Patient Currently Receiving the Following Services: Medication Management   Determination of Need: Emergent (2 hours)   Options For Referral: ED Visit; Inpatient Hospitalization; Medication Management     CCA Biopsychosocial Intake/Chief Complaint:  No data recorded Current Symptoms/Problems: No data recorded  Patient Reported Schizophrenia/Schizoaffective Diagnosis in Past: Yes   Strengths: Supportive mother  Preferences: No data recorded Abilities: No data recorded  Type of Services Patient Feels are Needed: No data recorded  Initial Clinical Notes/Concerns: No data recorded  Mental Health Symptoms Depression:   None   Duration of Depressive symptoms: No data recorded  Mania:   N/A   Anxiety:    Difficulty concentrating   Psychosis:   None   Duration of Psychotic symptoms:  Greater than six months   Trauma:   N/A   Obsessions:   N/A    Compulsions:   N/A   Inattention:   N/A   Hyperactivity/Impulsivity:   Feeling of restlessness   Oppositional/Defiant Behaviors:   None   Emotional Irregularity:   Mood lability   Other Mood/Personality Symptoms:   n/a    Mental Status Exam Appearance and self-care  Stature:   Average   Weight:   Average weight   Clothing:   Age-appropriate   Grooming:   Neglected   Cosmetic use:   None   Posture/gait:   Normal   Motor activity:   Not Remarkable   Sensorium  Attention:   Normal   Concentration:   Scattered   Orientation:   X5   Recall/memory:   Normal   Affect and Mood  Affect:   Anxious   Mood:   Anxious; Hypomania   Relating  Eye contact:   Avoided   Facial expression:   Anxious   Attitude toward examiner:   Cooperative   Thought and Language  Speech flow:  Garbled; Blocked   Thought content:   Appropriate to Mood and Circumstances   Preoccupation:   None   Hallucinations:   None   Organization:  No data recorded  [employer] of Knowledge:   Fair   Intelligence:   Average   Abstraction:   Overly  abstract   Judgement:   Poor   Reality Testing:   Unaware   Insight:   Poor   Decision Making:   Confused   Social Functioning  Social Maturity:   Irresponsible   Social Judgement:   Impropriety   Stress  Stressors:   Transitions; Housing   Coping Ability:   Normal   Skill Deficits:   None   Supports:   Family     Religion:    Leisure/Recreation:    Exercise/Diet: Exercise/Diet Do You Have Any Trouble Sleeping?: No   CCA Employment/Education Employment/Work Situation: Employment / Work Situation Employment Situation: Unemployed Patient's Job has Been Impacted by Current Illness: No Has Patient ever Been in Equities trader?: No  Education:     CCA Family/Childhood History Family and Relationship History: Family history Marital status: Single Does patient  have children?: No  Childhood History:     Child/Adolescent Assessment:     CCA Substance Use Alcohol/Drug Use: Alcohol / Drug Use Pain Medications: See MAR Prescriptions: See MAR Over the Counter: See MAR History of alcohol / drug use?: No history of alcohol / drug abuse Longest period of sobriety (when/how long): UTA Withdrawal Symptoms: None                         ASAM's:  Six Dimensions of Multidimensional Assessment  Dimension 1:  Acute Intoxication and/or Withdrawal Potential:      Dimension 2:  Biomedical Conditions and Complications:      Dimension 3:  Emotional, Behavioral, or Cognitive Conditions and Complications:     Dimension 4:  Readiness to Change:     Dimension 5:  Relapse, Continued use, or Continued Problem Potential:     Dimension 6:  Recovery/Living Environment:     ASAM Severity Score:    ASAM Recommended Level of Treatment:     Substance use Disorder (SUD)    Recommendations for Services/Supports/Treatments:    DSM5 Diagnoses: Patient Active Problem List   Diagnosis Date Noted   Schizophrenia (HCC) 11/02/2021   Psychosis (HCC) 11/01/2021   Abnormal behavior    Acute psychosis (HCC) 10/02/2021   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

## 2022-06-12 NOTE — Consult Note (Signed)
[facility] Face-to-Face Psychiatry Consult   Reason for Consult:  Hallucinations and noncompliance of medication Referring Physician:  EDP Patient Identification: Ronald [name] MRN:  [id number] Principal Diagnosis: Schizophrenia (HCC) Diagnosis:  Principal Problem:   Schizophrenia (HCC)   Total Time spent with patient: 30 minutes  Subjective: Ronald [name] is a 27 y.o. male patient admitted with reported delusions and noncompliance of antipsychotic medications. Patient states, "they nag me and I get paranoid".  HPI:  Ronald [name] is a 27 y.o. with a known diagnosis of schizophrenia reportedly brought in by his mother for experiencing hallucinations.The patient has been noncompliant with his prescribed medication and admits to stopping them due to lack of access to medication refills currently. He denies experiencing auditory or visual hallucinations during the assessment. No evidence of hallucinations noted. He denies suicidal or homicidal hallucinations. Patient evaluated in a calm state, though appeared sleepy and difficulty to engage during assessment.   Past Psychiatric History: Patient has a history of multiple visits to the hospital and emergency room with similar symptoms.  Risk to Self:   Risk to Others:   Prior Inpatient Therapy:   Prior Outpatient Therapy:    Past Medical History:  Past Medical History:  Diagnosis Date   Asthma    History reviewed. No pertinent surgical history. Family History: History reviewed. No pertinent family history. Family Psychiatric  History: None reported  Social History:  Social History   Substance and Sexual Activity  Alcohol Use Yes   Alcohol/week: 2.0 standard drinks of alcohol   Types: 2 Cans of beer per week   Comment: every other week     Social History   Substance and Sexual Activity  Drug Use Yes   Types: Marijuana   Comment: weekly    Social History   Socioeconomic History   Marital status: Single    Spouse name: Not on  file   Number of children: Not on file   Years of education: Not on file   Highest education level: Not on file  Occupational History   Not on file  Tobacco Use   Smoking status: Every Day    Packs/day: 1.00    Types: Cigarettes   Smokeless tobacco: Never  Vaping Use   Vaping Use: Never used  Substance and Sexual Activity   Alcohol use: Yes    Alcohol/week: 2.0 standard drinks of alcohol    Types: 2 Cans of beer per week    Comment: every other week   Drug use: Yes    Types: Marijuana    Comment: weekly   Sexual activity: Yes    Partners: Female    Birth control/protection: Condom  Other Topics Concern   Not on file  Social History Narrative   ** Merged History Encounter **       Social Determinants of Health   Financial Resource Strain: Not on file  Food Insecurity: Not on file  Transportation Needs: Not on file  Physical Activity: Not on file  Stress: Not on file  Social Connections: Not on file   Additional Social History:    Allergies:  No Known Allergies  Labs:  Results for orders placed or performed during the hospital encounter of 06/11/22 (from the past 48 hour(s))  Comprehensive metabolic panel     Status: Abnormal   Collection Time: 06/11/22  8:04 PM  Result Value Ref Range   Sodium 136 135 - 145 mmol/L   Potassium 3.2 (L) 3.5 - 5.1 mmol/L   Chloride 101 98 -  111 mmol/L   CO2 26 22 - 32 mmol/L   Glucose, Bld 136 (H) 70 - 99 mg/dL    Comment: Glucose reference range applies only to samples taken after fasting for at least 8 hours.   BUN 20 6 - 20 mg/dL   Creatinine, Ser 1.06 0.61 - 1.24 mg/dL   Calcium 9.5 8.9 - 10.3 mg/dL   Total Protein 8.0 6.5 - 8.1 g/dL   Albumin 4.3 3.5 - 5.0 g/dL   AST 24 15 - 41 U/L   ALT 20 0 - 44 U/L   Alkaline Phosphatase 61 38 - 126 U/L   Total Bilirubin 1.3 (H) 0.3 - 1.2 mg/dL   GFR, Estimated >60 >60 mL/min    Comment: (NOTE) Calculated using the CKD-EPI Creatinine Equation (2021)    Anion gap 9 5 - 15     Comment: Performed at [facility], [address] [place], [place] [address]  Ethanol     Status: None   Collection Time: 06/11/22  8:04 PM  Result Value Ref Range   Alcohol, Ethyl (B) <10 <10 mg/dL    Comment: (NOTE) Lowest detectable limit for serum alcohol is 10 mg/dL.  For medical purposes only. Performed at [facility], [address] [place], [place] [address]   Salicylate level     Status: Abnormal   Collection Time: 06/11/22  8:04 PM  Result Value Ref Range   Salicylate Lvl <3.8 (L) 7.0 - 30.0 mg/dL    Comment: Performed at [facility] [address] [place], [place] [address]  Acetaminophen level     Status: Abnormal   Collection Time: 06/11/22  8:04 PM  Result Value Ref Range   Acetaminophen (Tylenol), Serum <10 (L) 10 - 30 ug/mL    Comment: (NOTE) Therapeutic concentrations vary significantly. A range of 10-30 ug/mL  may be an effective concentration for many patients. However, some  are best treated at concentrations outside of this range. Acetaminophen concentrations >150 ug/mL at 4 hours after ingestion  and >50 ug/mL at 12 hours after ingestion are often associated with  toxic reactions.  Performed at [facility], [address] [place], [place] [address]   cbc     Status: Abnormal   Collection Time: 06/11/22  8:04 PM  Result Value Ref Range   WBC 7.1 4.0 - 10.5 K/uL   RBC 5.11 4.22 - 5.81 MIL/uL   Hemoglobin 15.0 13.0 - 17.0 g/dL   HCT 45.7 39.0 - 52.0 %   MCV 89.4 80.0 - 100.0 fL   MCH 29.4 26.0 - 34.0 pg   MCHC 32.8 30.0 - 36.0 g/dL   RDW 12.6 11.5 - 15.5 %   Platelets 330 150 - 400 K/uL   nRBC 0.3 (H) 0.0 - 0.2 %    Comment: Performed at [facility], [address] [place], [place] [address]  Resp panel by RT-PCR (RSV, Flu A&B, Covid) Anterior Nasal Swab     Status: None   Collection Time: 06/11/22  8:04 PM   Specimen: Anterior Nasal Swab  Result Value Ref Range   SARS Coronavirus 2 by RT  PCR NEGATIVE NEGATIVE    Comment: (NOTE) SARS-CoV-2 target nucleic acids are NOT DETECTED.  The SARS-CoV-2 RNA is generally detectable in upper respiratory specimens during the acute phase of infection. The lowest concentration of SARS-CoV-2 viral copies this assay can detect is 138 copies/mL. A negative result does not preclude SARS-Cov-2 infection and should not be used as  the sole basis for treatment or other patient management decisions. A negative result may occur with  improper specimen collection/handling, submission of specimen other than nasopharyngeal swab, presence of viral mutation(s) within the areas targeted by this assay, and inadequate number of viral copies(<138 copies/mL). A negative result must be combined with clinical observations, patient history, and epidemiological information. The expected result is Negative.  Fact Sheet for Patients:  BloggerCourse.com  Fact Sheet for Healthcare Providers:  SeriousBroker.it  This test is no t yet approved or cleared by the [place] FDA and  has been authorized for detection and/or diagnosis of SARS-CoV-2 by FDA under an Emergency Use Authorization (EUA). This EUA will remain  in effect (meaning this test can be used) for the duration of the COVID-19 declaration under Section 564(b)(1) of the Act, 21 U.S.C.section 360bbb-3(b)(1), unless the authorization is terminated  or revoked sooner.       Influenza A by PCR NEGATIVE NEGATIVE   Influenza B by PCR NEGATIVE NEGATIVE    Comment: (NOTE) The Xpert Xpress SARS-CoV-2/FLU/RSV plus assay is intended as an aid in the diagnosis of influenza from Nasopharyngeal swab specimens and should not be used as a sole basis for treatment. Nasal washings and aspirates are unacceptable for Xpert Xpress SARS-CoV-2/FLU/RSV testing.  Fact Sheet for Patients: BloggerCourse.com  Fact Sheet for Healthcare  Providers: SeriousBroker.it  This test is not yet approved or cleared by the [place] FDA and has been authorized for detection and/or diagnosis of SARS-CoV-2 by FDA under an Emergency Use Authorization (EUA). This EUA will remain in effect (meaning this test can be used) for the duration of the COVID-19 declaration under Section 564(b)(1) of the Act, 21 U.S.C. section 360bbb-3(b)(1), unless the authorization is terminated or revoked.     Resp Syncytial Virus by PCR NEGATIVE NEGATIVE    Comment: (NOTE) Fact Sheet for Patients: BloggerCourse.com  Fact Sheet for Healthcare Providers: SeriousBroker.it  This test is not yet approved or cleared by the [place] FDA and has been authorized for detection and/or diagnosis of SARS-CoV-2 by FDA under an Emergency Use Authorization (EUA). This EUA will remain in effect (meaning this test can be used) for the duration of the COVID-19 declaration under Section 564(b)(1) of the Act, 21 U.S.C. section 360bbb-3(b)(1), unless the authorization is terminated or revoked.  Performed at [facility], [address] [place], [place] [address]     No current facility-administered medications for this encounter.   No current outpatient medications on file.    Musculoskeletal: Strength & Muscle Tone: within normal limits Gait & Station:    Patient leans: N/A            Psychiatric Specialty Exam:  Presentation  General Appearance:  Other (comment) (Sleepy)  Eye Contact: Poor  Speech: Garbled  Speech Volume: Decreased  Handedness: Right   Mood and Affect  Mood: Euthymic  Affect: Congruent   Thought Process  Thought Processes: Goal Directed  Descriptions of Associations:Loose  Orientation:Full (Time, Place and Person)  Thought Content:Delusions  History of Schizophrenia/Schizoaffective disorder:Yes  Duration  of Psychotic Symptoms:Greater than six months  Hallucinations:Hallucinations: None (Denies)  Ideas of Reference:-- (No evidence of delusions noted or reported.)  Suicidal Thoughts:Suicidal Thoughts: No  Homicidal Thoughts:Homicidal Thoughts: No   Sensorium  Memory: Immediate Fair  Judgment: Impaired  Insight: Lacking   Executive Functions  Concentration: Poor  Attention Span: Fair  Recall: Fair  Fund of Knowledge: Poor  Language: Fair   Psychomotor Activity  Psychomotor Activity:Psychomotor Activity:  Decreased   Assets  Assets: Catering manager; Housing; Social Support; Transportation   Sleep  Sleep:Sleep: Fair   Physical Exam: Physical Exam Vitals and nursing note reviewed.  Constitutional:      Comments: Sleepy  HENT:     Head: Normocephalic.  Musculoskeletal:        General: Normal range of motion.  Skin:    General: Skin is dry.  Neurological:     Mental Status: He is oriented to person, place, and time.    Review of Systems  Constitutional:  Negative for diaphoresis.  HENT:  Negative for hearing loss.   Respiratory:  Negative for cough and shortness of breath.   Skin:  Negative for itching.  Neurological:  Negative for tremors.  Psychiatric/Behavioral:  Negative for suicidal ideas. The patient is not nervous/anxious.        Denies auditory and visual hallucinations.    Blood pressure 98/65, pulse 76, temperature 98.6 F (37 C), temperature source Oral, resp. rate 14, height 5\' 11"  (1.803 m), weight 64 kg, SpO2 98 %. Body mass index is 19.68 kg/m.  Treatment Plan Summary: Plan : Reinitiated antipsychotic medication regimen. Plan to admit for psychiatric  inpatient hospitalization. EDP made aware of recommendation.    Disposition: Patient presents with a history of schizophrenia currently experiencing relapse of symptoms likely due to medication noncompliance. Recommend psychiatric Inpatient admission when medically  cleared.  Ronny Flurry, NP 06/12/2022 10:57 AM

## 2022-06-12 NOTE — H&P (Signed)
Psychiatric Admission Assessment Adult  Patient Identification: Ronald [name] MRN:  [id number] Date of Evaluation:  06/12/2022 Chief Complaint:  Schizophrenia (HCC) [F20.9] Principal Diagnosis: Schizophrenia (HCC) Diagnosis:  Principal Problem:   Schizophrenia (HCC)  History of Present Illness: Patient seen and chart reviewed.  Patient known from previous encounters.  27 year old man with a history of schizophrenia and substance abuse was brought to the emergency room by family.  Family reported that his hallucinations have been worse psychotic symptoms have been worse and that he had been off medication.  Review of ER notes suggest that the patient was disorganized and at times a little agitated in the ER.  Could just barely cooperate with the evaluation.  Because of history of psychosis and substance induced psychosis patient was admitted to the psychiatric unit.  Attempted to interview him today.  Patient is in bed sleeping.  I was able to wake him up enough to open his eyes and make a couple of vocalizations but not to answer any questions lucidly.  Went right back to sleep.  Labs reviewed.  Nothing remarkable but no drug screen was obtained.  I have attempted to reach his mother on the telephone without success so far Associated Signs/Symptoms: Depression Symptoms:  insomnia, (Hypo) Manic Symptoms:  Hallucinations, Anxiety Symptoms:   Patient not specifically complaining of any Psychotic Symptoms:  Hallucinations: Auditory PTSD Symptoms: Negative Total Time spent with patient: 30 minutes  Past Psychiatric History: Patient has a history of psychiatric problems usually presenting with psychosis including hallucinations paranoia and agitation.  Underlying diagnosis of schizophrenia complicated by chronic abuse of drugs particularly stimulants.  Last inpatient hospitalization we know of was in June of this past year.  Is the patient at risk to self? Yes.    Has the patient been a risk to self  in the past 6 months? Yes.    Has the patient been a risk to self within the distant past? Yes.    Is the patient a risk to others? No.  Has the patient been a risk to others in the past 6 months? No.  Has the patient been a risk to others within the distant past? No.   [place] Scale:  Flowsheet Row Admission (Current) from 06/12/2022 in [facility] INPATIENT BEHAVIORAL MEDICINE ED from 06/11/2022 in [facility] EMERGENCY DEPARTMENT ED from 12[facility]18/2021 [facility]  C-SSRS RISK CATEGORY No Risk No Risk No Risk        Prior Inpatient Therapy: Yes.   If yes, describe patient has had similar prior admissions Prior Outpatient Therapy: No. If yes, describe does not cooperate with recommended outpatient treatment  Alcohol Screening: Patient refused Alcohol Screening Tool: Yes 1. How often do you have a drink containing alcohol?: Never 2. How many drinks containing alcohol do you have on a typical day when you are drinking?: 1 or 2 3. How often do you have six or more drinks on one occasion?: Never AUDIT-C Score: 0 4. How often during the last year have you found that you were not able to stop drinking once you had started?: Never 5. How often during the last year have you failed to do what was normally expected from you because of drinking?: Never 6. How often during the last year have you needed a first drink in the morning to get yourself going after a heavy drinking session?: Never 7. How often during the last year have you had a feeling of guilt of remorse after  drinking?: Never 8. How often during the last year have you been unable to remember what happened the night before because you had been drinking?: Never 9. Have you or someone else been injured as a result of your drinking?: No 10. Has a relative or friend or a doctor or another health worker been concerned about your drinking or suggested you cut down?: No Alcohol Use Disorder  Identification Test Final Score (AUDIT): 0 Substance Abuse History in the last 12 months:  Yes.   Consequences of Substance Abuse: Known history of stimulant abuse leading to worsening psychotic symptoms Previous Psychotropic Medications: Yes  Psychological Evaluations: Yes  Past Medical History:  Past Medical History:  Diagnosis Date   Asthma    History reviewed. No pertinent surgical history. Family History: History reviewed. No pertinent family history. Family Psychiatric  History: No information Tobacco Screening:  Social History   Tobacco Use  Smoking Status Every Day   Packs/day: 1.00   Types: Cigarettes  Smokeless Tobacco Never    BH Tobacco Counseling     Are you interested in Tobacco Cessation Medications?  No, patient refused Counseled patient on smoking cessation:  Refused/Declined practical counseling Reason Tobacco Screening Not Completed: Patient Refused Screening       Social History:  Social History   Substance and Sexual Activity  Alcohol Use Yes   Alcohol/week: 2.0 standard drinks of alcohol   Types: 2 Cans of beer per week   Comment: every other week     Social History   Substance and Sexual Activity  Drug Use Yes   Types: Marijuana   Comment: weekly    Additional Social History:                           Allergies:  No Known Allergies Lab Results:  Results for orders placed or performed during the hospital encounter of 06/11/22 (from the past 48 hour(s))  Comprehensive metabolic panel     Status: Abnormal   Collection Time: 06/11/22  8:04 PM  Result Value Ref Range   Sodium 136 135 - 145 mmol/L   Potassium 3.2 (L) 3.5 - 5.1 mmol/L   Chloride 101 98 - 111 mmol/L   CO2 26 22 - 32 mmol/L   Glucose, Bld 136 (H) 70 - 99 mg/dL    Comment: Glucose reference range applies only to samples taken after fasting for at least 8 hours.   BUN 20 6 - 20 mg/dL   Creatinine, Ser 0.17 0.61 - 1.24 mg/dL   Calcium 9.5 8.9 - 49.4 mg/dL   Total  Protein 8.0 6.5 - 8.1 g/dL   Albumin 4.3 3.5 - 5.0 g/dL   AST 24 15 - 41 U/L   ALT 20 0 - 44 U/L   Alkaline Phosphatase 61 38 - 126 U/L   Total Bilirubin 1.3 (H) 0.3 - 1.2 mg/dL   GFR, Estimated >49 >67 mL/min    Comment: (NOTE) Calculated using the CKD-EPI Creatinine Equation (2021)    Anion gap 9 5 - 15    Comment: Performed at [facility], [address] [place], [place] [address]  Ethanol     Status: None   Collection Time: 06/11/22  8:04 PM  Result Value Ref Range   Alcohol, Ethyl (B) <10 <10 mg/dL    Comment: (NOTE) Lowest detectable limit for serum alcohol is 10 mg/dL.  For medical purposes only. Performed at [facility], [address] [place],  [place] [address]   Salicylate level     Status: Abnormal   Collection Time: 06/11/22  8:04 PM  Result Value Ref Range   Salicylate Lvl <3.8 (L) 7.0 - 30.0 mg/dL    Comment: Performed at [facility], [address] [place], [place] [address]  Acetaminophen level     Status: Abnormal   Collection Time: 06/11/22  8:04 PM  Result Value Ref Range   Acetaminophen (Tylenol), Serum <10 (L) 10 - 30 ug/mL    Comment: (NOTE) Therapeutic concentrations vary significantly. A range of 10-30 ug/mL  may be an effective concentration for many patients. However, some  are best treated at concentrations outside of this range. Acetaminophen concentrations >150 ug/mL at 4 hours after ingestion  and >50 ug/mL at 12 hours after ingestion are often associated with  toxic reactions.  Performed at [facility], [address] [place], [place] [address]   cbc     Status: Abnormal   Collection Time: 06/11/22  8:04 PM  Result Value Ref Range   WBC 7.1 4.0 - 10.5 K/uL   RBC 5.11 4.22 - 5.81 MIL/uL   Hemoglobin 15.0 13.0 - 17.0 g/dL   HCT 45.7 39.0 - 52.0 %   MCV 89.4 80.0 - 100.0 fL   MCH 29.4 26.0 - 34.0 pg   MCHC 32.8 30.0 - 36.0 g/dL   RDW 12.6 11.5 - 15.5 %   Platelets 330 150 - 400 K/uL    nRBC 0.3 (H) 0.0 - 0.2 %    Comment: Performed at [facility], [address], [place], [place] [address]  Resp panel by RT-PCR (RSV, Flu A&B, Covid) Anterior Nasal Swab     Status: None   Collection Time: 06/11/22  8:04 PM   Specimen: Anterior Nasal Swab  Result Value Ref Range   SARS Coronavirus 2 by RT PCR NEGATIVE NEGATIVE    Comment: (NOTE) SARS-CoV-2 target nucleic acids are NOT DETECTED.  The SARS-CoV-2 RNA is generally detectable in upper respiratory specimens during the acute phase of infection. The lowest concentration of SARS-CoV-2 viral copies this assay can detect is 138 copies/mL. A negative result does not preclude SARS-Cov-2 infection and should not be used as the sole basis for treatment or other patient management decisions. A negative result may occur with  improper specimen collection/handling, submission of specimen other than nasopharyngeal swab, presence of viral mutation(s) within the areas targeted by this assay, and inadequate number of viral copies(<138 copies/mL). A negative result must be combined with clinical observations, patient history, and epidemiological information. The expected result is Negative.  Fact Sheet for Patients:  EntrepreneurPulse.com.au  Fact Sheet for Healthcare Providers:  IncredibleEmployment.be  This test is no t yet approved or cleared by the [place] FDA and  has been authorized for detection and/or diagnosis of SARS-CoV-2 by FDA under an Emergency Use Authorization (EUA). This EUA will remain  in effect (meaning this test can be used) for the duration of the COVID-19 declaration under Section 564(b)(1) of the Act, 21 U.S.C.section 360bbb-3(b)(1), unless the authorization is terminated  or revoked sooner.       Influenza A by PCR NEGATIVE NEGATIVE   Influenza B by PCR NEGATIVE NEGATIVE    Comment: (NOTE) The Xpert Xpress SARS-CoV-2/FLU/RSV plus assay is intended as an  aid in the diagnosis of influenza from Nasopharyngeal swab specimens and should not be used as a sole basis for treatment. Nasal washings and aspirates are unacceptable for Xpert Xpress SARS-CoV-2/FLU/RSV testing.  Fact Sheet for Patients: EntrepreneurPulse.com.au  Fact Sheet for Healthcare Providers: IncredibleEmployment.be  This test is not yet approved or cleared by the [place] FDA and has been authorized for detection and/or diagnosis of SARS-CoV-2 by FDA under an Emergency Use Authorization (EUA). This EUA will remain in effect (meaning this test can be used) for the duration of the COVID-19 declaration under Section 564(b)(1) of the Act, 21 U.S.C. section 360bbb-3(b)(1), unless the authorization is terminated or revoked.     Resp Syncytial Virus by PCR NEGATIVE NEGATIVE    Comment: (NOTE) Fact Sheet for Patients: EntrepreneurPulse.com.au  Fact Sheet for Healthcare Providers: IncredibleEmployment.be  This test is not yet approved or cleared by the [place] FDA and has been authorized for detection and/or diagnosis of SARS-CoV-2 by FDA under an Emergency Use Authorization (EUA). This EUA will remain in effect (meaning this test can be used) for the duration of the COVID-19 declaration under Section 564(b)(1) of the Act, 21 U.S.C. section 360bbb-3(b)(1), unless the authorization is terminated or revoked.  Performed at [facility], [address] [place], [place] [address]     Blood Alcohol level:  Lab Results  Component Value Date   [facility] <10 06/11/2022   ETH <10 50/35/4656    Metabolic Disorder Labs:  Lab Results  Component Value Date   HGBA1C 5.1 11/02/2021   MPG 99.67 11/02/2021   Lab Results  Component Value Date   PROLACTIN 22.8 (H) 05/08/2019   Lab Results  Component Value Date   CHOL 141 11/02/2021   TRIG 93 11/02/2021   HDL 69 11/02/2021   CHOLHDL 2.0  11/02/2021   VLDL 19 11/02/2021   LDLCALC 53 11/02/2021   LDLCALC 56 05/08/2019    Current Medications: Current Facility-Administered Medications  Medication Dose Route Frequency Provider Last Rate Last Admin   acetaminophen (TYLENOL) tablet 650 mg  650 mg Oral Q6H PRN Sherlon Handing, NP       alum & mag hydroxide-simeth (MAALOX/MYLANTA) 200-200-20 MG/5ML suspension 30 mL  30 mL Oral Q4H PRN Sherlon Handing, NP       benztropine (COGENTIN) tablet 0.5 mg  0.5 mg Oral QHS Barthold, Louise F, NP       benztropine (COGENTIN) tablet 0.5 mg  0.5 mg Oral BID Shanesha Bednarz, Madie Reno, MD       hydrOXYzine (ATARAX) tablet 50 mg  50 mg Oral TID PRN Sherlon Handing, NP       magnesium hydroxide (MILK OF MAGNESIA) suspension 30 mL  30 mL Oral Daily PRN Waldon Merl F, NP       risperiDONE (RISPERDAL M-TABS) disintegrating tablet 2 mg  2 mg Oral BID Waldon Merl F, NP       risperiDONE (RISPERDAL M-TABS) disintegrating tablet 2 mg  2 mg Oral BID Lillar Bianca, Madie Reno, MD       traZODone (DESYREL) tablet 50 mg  50 mg Oral QHS PRN Sherlon Handing, NP       PTA Medications: Medications Prior to Admission  Medication Sig Dispense Refill Last Dose   benztropine (COGENTIN) 0.5 MG tablet Take 1 tablet (0.5 mg total) by mouth at bedtime. (Patient not taking: Reported on 06/11/2022) 10 tablet 0    hydrOXYzine (ATARAX) 50 MG tablet Take 1 tablet (50 mg total) by mouth 3 (three) times daily as needed for anxiety (agitation). (Patient not taking: Reported on 06/11/2022) 30 tablet 0    nicotine polacrilex (NICORETTE) 2 MG gum Take 1 each (2 mg total) by  mouth as needed for smoking cessation. (Patient not taking: Reported on 06/11/2022) 30 tablet 0    risperiDONE (RISPERDAL M-TABS) 2 MG disintegrating tablet Take 1 tablet (2 mg total) by mouth 2 (two) times daily. (Patient not taking: Reported on 06/11/2022) 20 tablet 0    traZODone (DESYREL) 50 MG tablet Take 1 tablet (50 mg total) by mouth at bedtime as needed  for sleep. (Patient not taking: Reported on 06/11/2022) 10 tablet 0     Musculoskeletal: Strength & Muscle Tone: within normal limits Gait & Station:  Currently not able to test because he is not able to wake up but presumably still ambulatory at baseline Patient leans: N/A            Psychiatric Specialty Exam:  Presentation  General Appearance:  Other (comment) (Sleepy)  Eye Contact: Poor  Speech: Garbled  Speech Volume: Decreased  Handedness: Right   Mood and Affect  Mood: Euthymic  Affect: Congruent   Thought Process  Thought Processes: Goal Directed  Duration of Psychotic Symptoms:N/A Past Diagnosis of Schizophrenia or Psychoactive disorder: Yes  Descriptions of Associations:Loose  Orientation:Full (Time, Place and Person)  Thought Content:Delusions  Hallucinations:Hallucinations: None (Denies)  Ideas of Reference:-- (No evidence of delusions noted or reported.)  Suicidal Thoughts:Suicidal Thoughts: No  Homicidal Thoughts:Homicidal Thoughts: No   Sensorium  Memory: Immediate Fair  Judgment: Impaired  Insight: Lacking   Executive Functions  Concentration: Poor  Attention Span: Fair  Recall: Fair  Fund of Knowledge: Poor  Language: Fair   Psychomotor Activity  Psychomotor Activity: Psychomotor Activity: Decreased   Assets  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  Constitutional:      Appearance: Normal appearance.  HENT:     Head: Normocephalic and atraumatic.     Mouth/Throat:     Pharynx: Oropharynx is clear.  Eyes:     Pupils: Pupils are equal, round, and reactive to light.  Cardiovascular:     Rate and Rhythm: Normal rate and regular rhythm.  Pulmonary:     Effort: Pulmonary effort is normal.     Breath sounds: Normal breath sounds.  Abdominal:      General: Abdomen is flat.     Palpations: Abdomen is soft.  Musculoskeletal:        General: Normal range of motion.  Skin:    General: Skin is warm and dry.  Neurological:     General: No focal deficit present.     Mental Status: He is alert. Mental status is at baseline.  Psychiatric:        Attention and Perception: He is inattentive.        Mood and Affect: Affect is blunt.        Speech: He is noncommunicative.    Review of Systems  Unable to perform ROS: Language   Blood pressure 107/74, pulse 87, temperature 98.4 F (36.9 C), temperature source Oral, resp. rate 15, height 5\' 11"  (1.803 m), weight 64 kg, SpO2 99 %. Body mass index is 19.68 kg/m.  Treatment Plan Summary: Medication management and Plan patient will be started back on Risperdal and Cogentin at doses similar to what he had been on last time he was in the hospital.  We will continue to try to request a urine sample.  I have reached out to his mother but been and able to get anyone on the phone.  I  have left a message with the phone number of the unit.  Ongoing assessment daily of symptoms and inclusion in individual and group therapy and assessment  Observation Level/Precautions:  15 minute checks  Laboratory:  Chemistry Profile  Psychotherapy:    Medications:    Consultations:    Discharge Concerns:    Estimated LOS:  Other:     Physician Treatment Plan for Primary Diagnosis: Schizophrenia (HCC) Long Term Goal(s): Improvement in symptoms so as ready for discharge  Short Term Goals: Ability to demonstrate self-control will improve and Ability to identify and develop effective coping behaviors will improve  Physician Treatment Plan for Secondary Diagnosis: Principal Problem:   Schizophrenia (HCC)  Long Term Goal(s): Improvement in symptoms so as ready for discharge  Short Term Goals: Ability to maintain clinical measurements within normal limits will improve and Compliance with prescribed medications will  improve  I certify that inpatient services furnished can reasonably be expected to improve the patient's condition.    Mordecai Rasmussen, MD 1/16/20242:32 PM

## 2022-06-12 NOTE — ED Notes (Signed)
Pt given breakfast tray

## 2022-06-12 NOTE — ED Notes (Signed)
vol/pt was unable to participate in the assessment /psych team to follow up when pt is willing to engage.Marland Kitchen

## 2022-06-12 NOTE — Group Note (Signed)
LCSW Group Therapy Note  Group Date: 06/12/2022 Start Time: 1300 End Time: 1400   Type of Therapy and Topic:  Group Therapy - Healthy vs Unhealthy Coping Skills  Participation Level:  Did Not Attend   Description of Group The focus of this group was to determine what unhealthy coping techniques typically are used by group members and what healthy coping techniques would be helpful in coping with various problems. Patients were guided in becoming aware of the differences between healthy and unhealthy coping techniques. Patients were asked to identify 2-3 healthy coping skills they would like to learn to use more effectively.  Therapeutic Goals Patients learned that coping is what human beings do all day long to deal with various situations in their lives Patients defined and discussed healthy vs unhealthy coping techniques Patients identified their preferred coping techniques and identified whether these were healthy or unhealthy Patients determined 2-3 healthy coping skills they would like to become more familiar with and use more often. Patients provided support and ideas to each other   Summary of Patient Progress:   Patient did not attend group despite encouraged participation.     Therapeutic Modalities Cognitive Behavioral Therapy Motivational Interviewing  Larose Kells 06/12/2022  2:37 PM

## 2022-06-13 LAB — COMPREHENSIVE METABOLIC PANEL
ALT: 16 U/L (ref 0–44)
AST: 18 U/L (ref 15–41)
Albumin: 3.6 g/dL (ref 3.5–5.0)
Alkaline Phosphatase: 58 U/L (ref 38–126)
Anion gap: 7 (ref 5–15)
BUN: 15 mg/dL (ref 6–20)
CO2: 26 mmol/L (ref 22–32)
Calcium: 8.9 mg/dL (ref 8.9–10.3)
Chloride: 102 mmol/L (ref 98–111)
Creatinine, Ser: 1.05 mg/dL (ref 0.61–1.24)
GFR, Estimated: >60 mL/min (ref >60)
Glucose, Bld: 99 mg/dL (ref 70–99)
Potassium: 4 mmol/L (ref 3.5–5.1)
Sodium: 135 mmol/L (ref 135–145)
Total Bilirubin: 1 mg/dL (ref 0.3–1.2)
Total Protein: 7.2 g/dL (ref 6.5–8.1)

## 2022-06-13 MED ORDER — RISPERIDONE 1 MG PO TBDP
2.0000 mg | ORAL_TABLET | Freq: Two times a day (BID) | ORAL | Status: DC
  Administered 2022-06-13 – 2022-06-14 (×2): 2 mg via ORAL
  Filled 2022-06-13 (×2): qty 2

## 2022-06-13 NOTE — B[facility] Counselor (Signed)
CSW attempted to meet with the patient to complete assessment, patient was awake, however declined to respond to questions.   CSW will attempt again later.  Assunta Curtis, MSW, LCSW 06/13/2022 12:53 PM

## 2022-06-13 NOTE — Plan of Care (Signed)
  Problem: Safety: Goal: Ability to remain free from injury will improve Outcome: Progressing   Problem: Education: Goal: Mental status will improve Outcome: Progressing

## 2022-06-13 NOTE — Progress Notes (Signed)
[facility] MD Progress Note  06/13/2022 10:17 AM Ronald [name]  MRN:  [id number] Subjective: Patient seen and chart reviewed.  Patient attended treatment team today.  He was awake and interactive.  Took his medicine this morning.  At treatment team he was cooperative although still seems sluggish and withdrawn.  Patient was able to answer questions appropriately about his living situation and give some at least basic acknowledgment that he would follow-up with outpatient treatment.  Seemed distracted at times.  Not able to answer questions about hallucinations very consistently.  Not acting out however.  No behavioral issues today. Principal Problem: Schizophrenia (HCC) Diagnosis: Principal Problem:   Schizophrenia (HCC)  Total Time spent with patient: 30 minutes  Past Psychiatric History: Past history of psychosis and substance use problems  Past Medical History:  Past Medical History:  Diagnosis Date   Asthma    History reviewed. No pertinent surgical history. Family History: History reviewed. No pertinent family history. Family Psychiatric  History: See previous Social History:  Social History   Substance and Sexual Activity  Alcohol Use Yes   Alcohol/week: 2.0 standard drinks of alcohol   Types: 2 Cans of beer per week   Comment: every other week     Social History   Substance and Sexual Activity  Drug Use Yes   Types: Marijuana   Comment: weekly    Social History   Socioeconomic History   Marital status: Single    Spouse name: Not on file   Number of children: Not on file   Years of education: Not on file   Highest education level: Not on file  Occupational History   Not on file  Tobacco Use   Smoking status: Every Day    Packs/day: 1.00    Types: Cigarettes   Smokeless tobacco: Never  Vaping Use   Vaping Use: Never used  Substance and Sexual Activity   Alcohol use: Yes    Alcohol/week: 2.0 standard drinks of alcohol    Types: 2 Cans of beer per week    Comment:  every other week   Drug use: Yes    Types: Marijuana    Comment: weekly   Sexual activity: Yes    Partners: Female    Birth control/protection: Condom  Other Topics Concern   Not on file  Social History Narrative   ** Merged History Encounter **       Social Determinants of Health   Financial Resource Strain: Not on file  Food Insecurity: Unknown (06/12/2022)   Hunger Vital Sign    Worried About Running Out of Food in the Last Year: Patient refused    Ran Out of Food in the Last Year: Patient refused  Transportation Needs: Unknown (06/12/2022)   PRAPARE - Administrator, Civil Service (Medical): Patient refused    Lack of Transportation (Non-Medical): Patient refused  Physical Activity: Not on file  Stress: Not on file  Social Connections: Not on file   Additional Social History:                         Sleep: Fair  Appetite:  Fair  Current Medications: Current Facility-Administered Medications  Medication Dose Route Frequency Provider Last Rate Last Admin   acetaminophen (TYLENOL) tablet 650 mg  650 mg Oral Q6H PRN Vanetta Mulders, NP       alum & mag hydroxide-simeth (MAALOX/MYLANTA) 200-200-20 MG/5ML suspension 30 mL  30 mL Oral Q4H PRN Gabriel Cirri  F, NP       benztropine (COGENTIN) tablet 0.5 mg  0.5 mg Oral QHS Gabriel Cirri F, NP       benztropine (COGENTIN) tablet 0.5 mg  0.5 mg Oral BID Nyonna Hargrove, Jackquline Denmark, MD   0.5 mg at 06/13/22 8185   hydrOXYzine (ATARAX) tablet 50 mg  50 mg Oral TID PRN Vanetta Mulders, NP       magnesium hydroxide (MILK OF MAGNESIA) suspension 30 mL  30 mL Oral Daily PRN Vanetta Mulders, NP       risperiDONE (RISPERDAL M-TABS) disintegrating tablet 2 mg  2 mg Oral BID Gabriel Cirri F, NP       risperiDONE (RISPERDAL M-TABS) disintegrating tablet 2 mg  2 mg Oral BID Yukio Bisping, Jackquline Denmark, MD   2 mg at 06/13/22 6314   traZODone (DESYREL) tablet 50 mg  50 mg Oral QHS PRN Vanetta Mulders, NP        Lab Results:   Results for orders placed or performed during the hospital encounter of 06/11/22 (from the past 48 hour(s))  Comprehensive metabolic panel     Status: Abnormal   Collection Time: 06/11/22  8:04 PM  Result Value Ref Range   Sodium 136 135 - 145 mmol/L   Potassium 3.2 (L) 3.5 - 5.1 mmol/L   Chloride 101 98 - 111 mmol/L   CO2 26 22 - 32 mmol/L   Glucose, Bld 136 (H) 70 - 99 mg/dL    Comment: Glucose reference range applies only to samples taken after fasting for at least 8 hours.   BUN 20 6 - 20 mg/dL   Creatinine, Ser 9.70 0.61 - 1.24 mg/dL   Calcium 9.5 8.9 - 26.3 mg/dL   Total Protein 8.0 6.5 - 8.1 g/dL   Albumin 4.3 3.5 - 5.0 g/dL   AST 24 15 - 41 U/L   ALT 20 0 - 44 U/L   Alkaline Phosphatase 61 38 - 126 U/L   Total Bilirubin 1.3 (H) 0.3 - 1.2 mg/dL   GFR, Estimated >78 >58 mL/min    Comment: (NOTE) Calculated using the CKD-EPI Creatinine Equation (2021)    Anion gap 9 5 - 15    Comment: Performed at [facility], [address] [place], [place] [address]  Ethanol     Status: None   Collection Time: 06/11/22  8:04 PM  Result Value Ref Range   Alcohol, Ethyl (B) <10 <10 mg/dL    Comment: (NOTE) Lowest detectable limit for serum alcohol is 10 mg/dL.  For medical purposes only. Performed at [facility], [address] [place], [place] [address]   Salicylate level     Status: Abnormal   Collection Time: 06/11/22  8:04 PM  Result Value Ref Range   Salicylate Lvl <7.0 (L) 7.0 - 30.0 mg/dL    Comment: Performed at [facility], [address] [place], [place] [address]  Acetaminophen level     Status: Abnormal   Collection Time: 06/11/22  8:04 PM  Result Value Ref Range   Acetaminophen (Tylenol), Serum <10 (L) 10 - 30 ug/mL    Comment: (NOTE) Therapeutic concentrations vary significantly. A range of 10-30 ug/mL  may be an effective concentration for many patients. However, some  are best treated at concentrations outside of this  range. Acetaminophen concentrations >150 ug/mL at 4 hours after ingestion  and >50 ug/mL at 12 hours after ingestion are often associated with  toxic reactions.  Performed at [facility], [address] [place], [place] [address]   cbc     Status: Abnormal   Collection Time: 06/11/22  8:04 PM  Result Value Ref Range   WBC 7.1 4.0 - 10.5 K/uL   RBC 5.11 4.22 - 5.81 MIL/uL   Hemoglobin 15.0 13.0 - 17.0 g/dL   HCT 84.6 96.2 - 95.2 %   MCV 89.4 80.0 - 100.0 fL   MCH 29.4 26.0 - 34.0 pg   MCHC 32.8 30.0 - 36.0 g/dL   RDW 84.1 32.4 - 40.1 %   Platelets 330 150 - 400 K/uL   nRBC 0.3 (H) 0.0 - 0.2 %    Comment: Performed at [facility], [address], [place], [place] [address]  Resp panel by RT-PCR (RSV, Flu A&B, Covid) Anterior Nasal Swab     Status: None   Collection Time: 06/11/22  8:04 PM   Specimen: Anterior Nasal Swab  Result Value Ref Range   SARS Coronavirus 2 by RT PCR NEGATIVE NEGATIVE    Comment: (NOTE) SARS-CoV-2 target nucleic acids are NOT DETECTED.  The SARS-CoV-2 RNA is generally detectable in upper respiratory specimens during the acute phase of infection. The lowest concentration of SARS-CoV-2 viral copies this assay can detect is 138 copies/mL. A negative result does not preclude SARS-Cov-2 infection and should not be used as the sole basis for treatment or other patient management decisions. A negative result may occur with  improper specimen collection/handling, submission of specimen other than nasopharyngeal swab, presence of viral mutation(s) within the areas targeted by this assay, and inadequate number of viral copies(<138 copies/mL). A negative result must be combined with clinical observations, patient history, and epidemiological information. The expected result is Negative.  Fact Sheet for Patients:  BloggerCourse.com  Fact Sheet for Healthcare Providers:   SeriousBroker.it  This test is no t yet approved or cleared by the [place] FDA and  has been authorized for detection and/or diagnosis of SARS-CoV-2 by FDA under an Emergency Use Authorization (EUA). This EUA will remain  in effect (meaning this test can be used) for the duration of the COVID-19 declaration under Section 564(b)(1) of the Act, 21 U.S.C.section 360bbb-3(b)(1), unless the authorization is terminated  or revoked sooner.       Influenza A by PCR NEGATIVE NEGATIVE   Influenza B by PCR NEGATIVE NEGATIVE    Comment: (NOTE) The Xpert Xpress SARS-CoV-2/FLU/RSV plus assay is intended as an aid in the diagnosis of influenza from Nasopharyngeal swab specimens and should not be used as a sole basis for treatment. Nasal washings and aspirates are unacceptable for Xpert Xpress SARS-CoV-2/FLU/RSV testing.  Fact Sheet for Patients: BloggerCourse.com  Fact Sheet for Healthcare Providers: SeriousBroker.it  This test is not yet approved or cleared by the [place] FDA and has been authorized for detection and/or diagnosis of SARS-CoV-2 by FDA under an Emergency Use Authorization (EUA). This EUA will remain in effect (meaning this test can be used) for the duration of the COVID-19 declaration under Section 564(b)(1) of the Act, 21 U.S.C. section 360bbb-3(b)(1), unless the authorization is terminated or revoked.     Resp Syncytial Virus by PCR NEGATIVE NEGATIVE    Comment: (NOTE) Fact Sheet for Patients: BloggerCourse.com  Fact Sheet for Healthcare Providers: SeriousBroker.it  This test is not yet approved or cleared by the [place] FDA and has been authorized for detection and/or diagnosis of SARS-CoV-2 by FDA under an Emergency Use Authorization (EUA). This EUA will remain in effect (meaning this test  can be used) for the duration of  the COVID-19 declaration under Section 564(b)(1) of the Act, 21 U.S.C. section 360bbb-3(b)(1), unless the authorization is terminated or revoked.  Performed at [facility], [address] [place], [place] [address]     Blood Alcohol level:  Lab Results  Component Value Date   [facility] <10 06/11/2022   ETH <10 41/28/7867    Metabolic Disorder Labs: Lab Results  Component Value Date   HGBA1C 5.1 11/02/2021   MPG 99.67 11/02/2021   Lab Results  Component Value Date   PROLACTIN 22.8 (H) 05/08/2019   Lab Results  Component Value Date   CHOL 141 11/02/2021   TRIG 93 11/02/2021   HDL 69 11/02/2021   CHOLHDL 2.0 11/02/2021   VLDL 19 11/02/2021   LDLCALC 53 11/02/2021   LDLCALC 56 05/08/2019    Physical Findings: AIMS:  , ,  ,  ,    CIWA:    COWS:     Musculoskeletal: Strength & Muscle Tone: within normal limits Gait & Station: normal Patient leans: N/A  Psychiatric Specialty Exam:  Presentation  General Appearance:  Other (comment) (Sleepy)  Eye Contact: Poor  Speech: Garbled  Speech Volume: Decreased  Handedness: Right   Mood and Affect  Mood: Euthymic  Affect: Congruent   Thought Process  Thought Processes: Goal Directed  Descriptions of Associations:Loose  Orientation:Full (Time, Place and Person)  Thought Content:Delusions  History of Schizophrenia/Schizoaffective disorder:Yes  Duration of Psychotic Symptoms:Greater than six months  Hallucinations:Hallucinations: None (Denies)  Ideas of Reference:-- (No evidence of delusions noted or reported.)  Suicidal Thoughts:Suicidal Thoughts: No  Homicidal Thoughts:Homicidal Thoughts: No   Sensorium  Memory: Immediate Fair  Judgment: Impaired  Insight: Lacking   Executive Functions  Concentration: Poor  Attention Span: Fair  Recall: [place] of Knowledge: Poor  Language: Fair   Psychomotor Activity  Psychomotor Activity: Psychomotor Activity:  Decreased   Assets  Assets: Catering manager; Housing; Social Support; Transportation   Sleep  Sleep: Sleep: Fair    Physical Exam: Physical Exam Vitals and nursing note reviewed.  Constitutional:      Appearance: Normal appearance.  HENT:     Head: Normocephalic and atraumatic.     Mouth/Throat:     Pharynx: Oropharynx is clear.  Eyes:     Pupils: Pupils are equal, round, and reactive to light.  Cardiovascular:     Rate and Rhythm: Normal rate and regular rhythm.  Pulmonary:     Effort: Pulmonary effort is normal.     Breath sounds: Normal breath sounds.  Abdominal:     General: Abdomen is flat.     Palpations: Abdomen is soft.  Musculoskeletal:        General: Normal range of motion.  Skin:    General: Skin is warm and dry.  Neurological:     General: No focal deficit present.     Mental Status: He is alert. Mental status is at baseline.  Psychiatric:        Attention and Perception: He is inattentive.        Mood and Affect: Mood normal. Affect is blunt.        Speech: Speech is delayed.        Behavior: Behavior is slowed.        Thought Content: Thought content normal.        Cognition and Memory: Memory is impaired.    Review of Systems  Constitutional: Negative.   HENT: Negative.  Eyes: Negative.   Respiratory: Negative.    Cardiovascular: Negative.   Gastrointestinal: Negative.   Musculoskeletal: Negative.   Skin: Negative.   Neurological: Negative.   Psychiatric/Behavioral: Negative.     Blood pressure (!) 102/58, pulse 62, temperature 98.7 F (37.1 C), temperature source Oral, resp. rate 18, height 5\' 11"  (1.803 m), weight 64 kg, SpO2 99 %. Body mass index is 19.68 kg/m.   Treatment Plan Summary: Medication management and Plan continue current dose of Risperdal and Cogentin.  Labs reviewed he had a couple of abnormalities in his labs when he first came to the hospital that I am going to recheck on.  Encourage group  attendance.  Patient spoke with the representative from [facility] and is tentatively agreeable to setting back up with outpatient treatment after discharge.  Estimated length of stay probably in the 3 or 4-day range.  Alethia Berthold, MD 06/13/2022, 10:17 AM

## 2022-06-13 NOTE — Progress Notes (Signed)
Patient demonstrating adequate appetite and comes out mostly for meals. Patient remains mostly in room throughout day. Patient denies SI,HI, and A/V/H with no plan/intent. Patient is med compliant and states his goal for today is to attend groups.     06/13/22 0830  Psych Admission Type (Psych Patients Only)  Admission Status Involuntary  Psychosocial Assessment  Patient Complaints None  Eye Contact Fair  Facial Expression Flat  Affect Flat  Speech Logical/coherent  Interaction Minimal  Motor Activity Slow  Appearance/Hygiene Disheveled;In scrubs  Behavior Characteristics Cooperative  Mood Depressed  Thought Process  Coherency Circumstantial  Content WDL  Delusions None reported or observed  Perception WDL  Hallucination None reported or observed  Judgment Limited  Confusion None  Danger to Self  Current suicidal ideation? Denies  Danger to Others  Danger to Others None reported or observed

## 2022-06-13 NOTE — Progress Notes (Signed)
Recreation Therapy Notes  Date: 06/13/2022   Time: 10:25 am   Location: Craft room      Behavioral response: N/A   Intervention Topic: Stress Management    Discussion/Intervention: Patient refused to attend group.    Clinical Observations/Feedback:  Patient refused to attend group.    Orrin Yurkovich LRT/CTRS        Brittan Mapel 06/13/2022 11:54 AM

## 2022-06-13 NOTE — BH IP Treatment Plan (Signed)
Interdisciplinary Treatment and Diagnostic Plan Update  06/13/2022 Time of Session: 09:27 Ronald [name] MRN: [id number]  Principal Diagnosis: Schizophrenia Ronald [name])  Secondary Diagnoses: Principal Problem:   [name]   Current Medications:  Current Facility-Administered Medications  Medication Dose Route Frequency Provider Last Rate Last Admin   acetaminophen (TYLENOL) tablet 650 mg  650 mg Oral Q6H PRN Sherlon Handing, NP       alum & mag hydroxide-simeth (MAALOX/MYLANTA) 200-200-20 MG/5ML suspension 30 mL  30 mL Oral Q4H PRN Sherlon Handing, NP       benztropine (COGENTIN) tablet 0.5 mg  0.5 mg Oral QHS Waldon Merl F, NP       benztropine (COGENTIN) tablet 0.5 mg  0.5 mg Oral BID Clapacs, Madie Reno, MD   0.5 mg at 06/13/22 3710   hydrOXYzine (ATARAX) tablet 50 mg  50 mg Oral TID PRN Sherlon Handing, NP       magnesium hydroxide (MILK OF MAGNESIA) suspension 30 mL  30 mL Oral Daily PRN Waldon Merl F, NP       risperiDONE (RISPERDAL M-TABS) disintegrating tablet 2 mg  2 mg Oral BID Waldon Merl F, NP       risperiDONE (RISPERDAL M-TABS) disintegrating tablet 2 mg  2 mg Oral BID Clapacs, Madie Reno, MD   2 mg at 06/13/22 6269   traZODone (DESYREL) tablet 50 mg  50 mg Oral QHS PRN Sherlon Handing, NP       PTA Medications: Medications Prior to Admission  Medication Sig Dispense Refill Last Dose   benztropine (COGENTIN) 0.5 MG tablet Take 1 tablet (0.5 mg total) by mouth at bedtime. (Patient not taking: Reported on 06/11/2022) 10 tablet 0    hydrOXYzine (ATARAX) 50 MG tablet Take 1 tablet (50 mg total) by mouth 3 (three) times daily as needed for anxiety (agitation). (Patient not taking: Reported on 06/11/2022) 30 tablet 0    nicotine polacrilex (NICORETTE) 2 MG gum Take 1 each (2 mg total) by mouth as needed for smoking cessation. (Patient not taking: Reported on 06/11/2022) 30 tablet 0    risperiDONE (RISPERDAL M-TABS) 2 MG disintegrating tablet Take 1 tablet (2  mg total) by mouth 2 (two) times daily. (Patient not taking: Reported on 06/11/2022) 20 tablet 0    traZODone (DESYREL) 50 MG tablet Take 1 tablet (50 mg total) by mouth at bedtime as needed for sleep. (Patient not taking: Reported on 06/11/2022) 10 tablet 0     Patient Stressors: Medication change or noncompliance    Patient Strengths: Ability for insight   Treatment Modalities: Medication Management, Group therapy, Case management,  1 to 1 session with clinician, Psychoeducation, Recreational therapy.   Physician Treatment Plan for Primary Diagnosis: [name] Long Term Goal(s): Improvement in symptoms so as ready for discharge   Short Term Goals: Ability to maintain clinical measurements within normal limits will improve Compliance with prescribed medications will improve Ability to demonstrate self-control will improve Ability to identify and develop effective coping behaviors will improve  Medication Management: Evaluate patient's response, side effects, and tolerance of medication regimen.  Therapeutic Interventions: 1 to 1 sessions, Unit Group sessions and Medication administration.  Evaluation of Outcomes: Progressing  Physician Treatment Plan for Secondary Diagnosis: Principal Problem:   [name]  Long Term Goal(s): Improvement in symptoms so as ready for discharge   Short Term Goals: Ability to maintain clinical measurements within normal limits will improve Compliance with prescribed medications will improve Ability to demonstrate self-control will improve Ability  to identify and develop effective coping behaviors will improve     Medication Management: Evaluate patient's response, side effects, and tolerance of medication regimen.  Therapeutic Interventions: 1 to 1 sessions, Unit Group sessions and Medication administration.  Evaluation of Outcomes: Progressing   RN Treatment Plan for Primary Diagnosis: [name] Long Term Goal(s):  Knowledge of disease and therapeutic regimen to maintain health will improve  Short Term Goals: Ability to remain free from injury will improve, Ability to verbalize frustration and anger appropriately will improve, Ability to demonstrate self-control, Ability to participate in decision making will improve, Ability to verbalize feelings will improve, Ability to disclose and discuss suicidal ideas, Ability to identify and develop effective coping behaviors will improve, and Compliance with prescribed medications will improve  Medication Management: RN will administer medications as ordered by provider, will assess and evaluate patient's response and provide education to patient for prescribed medication. RN will report any adverse and/or side effects to prescribing provider.  Therapeutic Interventions: 1 on 1 counseling sessions, Psychoeducation, Medication administration, Evaluate responses to treatment, Monitor vital signs and CBGs as ordered, Perform/monitor CIWA, COWS, AIMS and Fall Risk screenings as ordered, Perform wound care treatments as ordered.  Evaluation of Outcomes: Progressing   LCSW Treatment Plan for Primary Diagnosis: [name] Long Term Goal(s): Safe transition to appropriate next level of care at discharge, Engage patient in therapeutic group addressing interpersonal concerns.  Short Term Goals: Engage patient in aftercare planning with referrals and resources, Increase social support, Increase ability to appropriately verbalize feelings, Increase emotional regulation, Facilitate acceptance of mental health diagnosis and concerns, and Increase skills for wellness and recovery  Therapeutic Interventions: Assess for all discharge needs, 1 to 1 time with Social worker, Explore available resources and support systems, Assess for adequacy in community support network, Educate family and significant other(s) on suicide prevention, Complete Psychosocial Assessment, Interpersonal  group therapy.  Evaluation of Outcomes: Progressing   Progress in Treatment: Attending groups: No. Participating in groups: No. Taking medication as prescribed: Yes. Toleration medication: Yes. Family/Significant other contact made: No, will contact:  once given permission. Patient understands diagnosis: Yes. Discussing patient identified problems/goals with staff: Yes. Medical problems stabilized or resolved: Yes. Denies suicidal/homicidal ideation: Yes. Issues/concerns per patient self-inventory: No. Other: none.  New problem(s) identified: No, Describe:  none identified.  New Short Term/Long Term Goal(s): elimination of symptoms of psychosis, medication management for mood stabilization; elimination of SI thoughts; development of comprehensive mental wellness plan.   Patient Goals:  "Going to group everyday and answering the questions."  Discharge Plan or Barriers: CSW will assist pt with development of an appropriate aftercare/discharge plan.   Reason for Continuation of Hospitalization: Suicidal ideation Other; describe Psychosis  Estimated Length of Stay: 1-7 days  Last 3 [place] Suicide Severity Risk Score: [place] Admission (Current) from 06/12/2022 in [place] ED from 06/11/2022 in [place] ED from 12/18/2021 in [place] No Risk No Risk No Risk       Last PHQ 2/9 Scores:     No data to display          Scribe for Treatment Team: Shirl Harris, LCSW 06/13/2022 9:45 AM

## 2022-06-13 NOTE — Group Note (Signed)
[facility] LCSW Group Therapy Note   Group Date: 06/13/2022 Start Time: 1300 End Time: 1400   Type of Therapy/Topic:  Group Therapy:  Emotion Regulation  Participation Level:  Did Not Attend   Mood:  Description of Group:    The purpose of this group is to assist patients in learning to regulate negative emotions and experience positive emotions. Patients will be guided to discuss ways in which they have been vulnerable to their negative emotions. These vulnerabilities will be juxtaposed with experiences of positive emotions or situations, and patients challenged to use positive emotions to combat negative ones. Special emphasis will be placed on coping with negative emotions in conflict situations, and patients will process healthy conflict resolution skills.  Therapeutic Goals: Patient will identify two positive emotions or experiences to reflect on in order to balance out negative emotions:  Patient will label two or more emotions that they find the most difficult to experience:  Patient will be able to demonstrate positive conflict resolution skills through discussion or role plays:   Summary of Patient Progress:   X    Therapeutic Modalities:   Cognitive Behavioral Therapy Feelings Identification Dialectical Behavioral Therapy   Dann Galicia J Janeese Mcgloin, LCSW 

## 2022-06-14 DIAGNOSIS — F203 Undifferentiated schizophrenia: Secondary | ICD-10-CM

## 2022-06-14 MED ORDER — RISPERIDONE 1 MG PO TBDP
4.0000 mg | ORAL_TABLET | Freq: Every day | ORAL | Status: DC
  Administered 2022-06-16 – 2022-06-17 (×2): 4 mg via ORAL
  Filled 2022-06-14 (×2): qty 4

## 2022-06-14 MED ORDER — BENZTROPINE MESYLATE 1 MG PO TABS
1.0000 mg | ORAL_TABLET | Freq: Every day | ORAL | Status: DC
  Administered 2022-06-15 – 2022-06-17 (×3): 1 mg via ORAL
  Filled 2022-06-14 (×3): qty 1

## 2022-06-14 NOTE — Progress Notes (Signed)
[name] MD Progress Note  06/14/2022 12:54 PM Ronald [name]  MRN:  [id number] Subjective: Patient seen and chart reviewed.  27 year old man with a history of schizophrenia and substance use.  Patient is still staying mostly to himself in bed.  Did not have any new complaint except to mention that sometimes his hand will twitch although he minimized the degree to which it was bothering him.  Claims to not be having hallucinations. Principal Problem: Schizophrenia ([place]) Diagnosis: Principal Problem:   Schizophrenia ([place])  Total Time spent with patient: 30 minutes  Past Psychiatric History: Past history of schizophrenia  Past Medical History:  Past Medical History:  Diagnosis Date   Asthma    History reviewed. No pertinent surgical history. Family History: History reviewed. No pertinent family history. Family Psychiatric  History: See previous Social History:  Social History   Substance and Sexual Activity  Alcohol Use Yes   Alcohol/week: 2.0 standard drinks of alcohol   Types: 2 Cans of beer per week   Comment: every other week     Social History   Substance and Sexual Activity  Drug Use Yes   Types: Marijuana   Comment: weekly    Social History   Socioeconomic History   Marital status: Single    Spouse name: Not on file   Number of children: Not on file   Years of education: Not on file   Highest education level: Not on file  Occupational History   Not on file  Tobacco Use   Smoking status: Every Day    Packs/day: 1.00    Types: Cigarettes   Smokeless tobacco: Never  Vaping Use   Vaping Use: Never used  Substance and Sexual Activity   Alcohol use: Yes    Alcohol/week: 2.0 standard drinks of alcohol    Types: 2 Cans of beer per week    Comment: every other week   Drug use: Yes    Types: Marijuana    Comment: weekly   Sexual activity: Yes    Partners: Female    Birth control/protection: Condom  Other Topics Concern   Not on file  Social History Narrative    ** Merged History Encounter **       Social Determinants of Health   Financial Resource Strain: Not on file  Food Insecurity: Unknown (06/12/2022)   Hunger Vital Sign    Worried About Running Out of Food in the Last Year: Patient refused    Ran Out of Food in the Last Year: Patient refused  Transportation Needs: Unknown (06/12/2022)   PRAPARE - Hydrologist (Medical): Patient refused    Lack of Transportation (Non-Medical): Patient refused  Physical Activity: Not on file  Stress: Not on file  Social Connections: Not on file   Additional Social History:                         Sleep: Fair  Appetite:  Fair  Current Medications: Current Facility-Administered Medications  Medication Dose Route Frequency Provider Last Rate Last Admin   acetaminophen (TYLENOL) tablet 650 mg  650 mg Oral Q6H PRN Sherlon Handing, NP       alum & mag hydroxide-simeth (MAALOX/MYLANTA) 200-200-20 MG/5ML suspension 30 mL  30 mL Oral Q4H PRN Sherlon Handing, NP       [START ON 06/15/2022] benztropine (COGENTIN) tablet 1 mg  1 mg Oral QHS Galdino Hinchman, Madie Reno, MD  hydrOXYzine (ATARAX) tablet 50 mg  50 mg Oral TID PRN Vanetta Mulders, NP       magnesium hydroxide (MILK OF MAGNESIA) suspension 30 mL  30 mL Oral Daily PRN Vanetta Mulders, NP       [START ON 06/15/2022] risperiDONE (RISPERDAL M-TABS) disintegrating tablet 4 mg  4 mg Oral QHS Kelie Gainey, Jackquline Denmark, MD       traZODone (DESYREL) tablet 50 mg  50 mg Oral QHS PRN Vanetta Mulders, NP        Lab Results:  Results for orders placed or performed during the hospital encounter of 06/12/22 (from the past 48 hour(s))  Comprehensive metabolic panel     Status: None   Collection Time: 06/13/22 12:20 PM  Result Value Ref Range   Sodium 135 135 - 145 mmol/L   Potassium 4.0 3.5 - 5.1 mmol/L   Chloride 102 98 - 111 mmol/L   CO2 26 22 - 32 mmol/L   Glucose, Bld 99 70 - 99 mg/dL    Comment: Glucose reference range  applies only to samples taken after fasting for at least 8 hours.   BUN 15 6 - 20 mg/dL   Creatinine, Ser 8.29 0.61 - 1.24 mg/dL   Calcium 8.9 8.9 - 93.7 mg/dL   Total Protein 7.2 6.5 - 8.1 g/dL   Albumin 3.6 3.5 - 5.0 g/dL   AST 18 15 - 41 U/L   ALT 16 0 - 44 U/L   Alkaline Phosphatase 58 38 - 126 U/L   Total Bilirubin 1.0 0.3 - 1.2 mg/dL   GFR, Estimated >16 >96 mL/min    Comment: (NOTE) Calculated using the CKD-EPI Creatinine Equation (2021)    Anion gap 7 5 - 15    Comment: Performed at [facility], [address] [place], [place] [address]    Blood Alcohol level:  Lab Results  Component Value Date   [facility] <10 06/11/2022   ETH <10 12/18/2021    Metabolic Disorder Labs: Lab Results  Component Value Date   HGBA1C 5.1 11/02/2021   MPG 99.67 11/02/2021   Lab Results  Component Value Date   PROLACTIN 22.8 (H) 05/08/2019   Lab Results  Component Value Date   CHOL 141 11/02/2021   TRIG 93 11/02/2021   HDL 69 11/02/2021   CHOLHDL 2.0 11/02/2021   VLDL 19 11/02/2021   LDLCALC 53 11/02/2021   LDLCALC 56 05/08/2019    Physical Findings: AIMS: Facial and Oral Movements Muscles of Facial Expression: None, normal Lips and Perioral Area: None, normal Jaw: None, normal Tongue: None, normal,Extremity Movements Upper (arms, wrists, hands, fingers): None, normal Lower (legs, knees, ankles, toes): None, normal, Trunk Movements Neck, shoulders, hips: None, normal, Overall Severity Severity of abnormal movements (highest score from questions above): None, normal Incapacitation due to abnormal movements: None, normal Patient's awareness of abnormal movements (rate only patient's report): No Awareness, Dental Status Current problems with teeth and/or dentures?: No Does patient usually wear dentures?: No  CIWA:    COWS:     Musculoskeletal: Strength & Muscle Tone: within normal limits Gait & Station: normal Patient leans: N/A  Psychiatric Specialty  Exam:  Presentation  General Appearance:  Other (comment) (Sleepy)  Eye Contact: Poor  Speech: Garbled  Speech Volume: Decreased  Handedness: Right   Mood and Affect  Mood: Euthymic  Affect: Congruent   Thought Process  Thought Processes: Goal Directed  Descriptions of Associations:Loose  Orientation:Full (Time, Place and Person)  Thought Content:Delusions  History of Schizophrenia/Schizoaffective disorder:Yes  Duration of Psychotic Symptoms:Greater than six months  Hallucinations:No data recorded Ideas of Reference:-- (No evidence of delusions noted or reported.)  Suicidal Thoughts:No data recorded Homicidal Thoughts:No data recorded  Sensorium  Memory: Immediate Fair  Judgment: Impaired  Insight: Lacking   Executive Functions  Concentration: Poor  Attention Span: Fair  Recall: [place] of Knowledge: Poor  Language: Fair   Psychomotor Activity  Psychomotor Activity:No data recorded  Assets  Assets: Financial Resources/Insurance; Housing; Social Support; Transportation   Sleep  Sleep:No data recorded   Physical Exam: Physical Exam Vitals and nursing note reviewed.  Constitutional:      Appearance: Normal appearance.  HENT:     Head: Normocephalic and atraumatic.     Mouth/Throat:     Pharynx: Oropharynx is clear.  Eyes:     Pupils: Pupils are equal, round, and reactive to light.  Cardiovascular:     Rate and Rhythm: Normal rate and regular rhythm.  Pulmonary:     Effort: Pulmonary effort is normal.     Breath sounds: Normal breath sounds.  Abdominal:     General: Abdomen is flat.     Palpations: Abdomen is soft.  Musculoskeletal:        General: Normal range of motion.  Skin:    General: Skin is warm and dry.  Neurological:     General: No focal deficit present.     Mental Status: He is alert. Mental status is at baseline.  Psychiatric:        Attention and Perception: He is inattentive.        Mood  and Affect: Mood normal. Affect is blunt.        Speech: Speech is delayed.        Behavior: Behavior is slowed.        Thought Content: Thought content normal.    Review of Systems  Constitutional: Negative.   HENT: Negative.    Eyes: Negative.   Respiratory: Negative.    Cardiovascular: Negative.   Gastrointestinal: Negative.   Musculoskeletal: Negative.   Skin: Negative.   Neurological:  Positive for tremors.  Psychiatric/Behavioral: Negative.     Blood pressure 116/80, pulse 70, temperature 98.1 F (36.7 C), temperature source Oral, resp. rate 18, height 5\' 11"  (1.803 m), weight 64 kg, SpO2 100 %. Body mass index is 19.68 kg/m.   Treatment Plan Summary: Medication management and Plan I am going to alter his medication by changing the Risperdal and Cogentin to be given at nighttime only to decrease daytime sedation.  Encourage patient to be up when he can participate in activities.  Anticipate still that we will be looking at discharge fairly soon once he is back to being more appropriate and alert during the day  Alethia Berthold, MD 06/14/2022, 12:54 PM

## 2022-06-14 NOTE — Plan of Care (Signed)
  Problem: Nutrition: Goal: Adequate nutrition will be maintained Outcome: Progressing   Problem: Health Behavior/Discharge Planning: Goal: Compliance with treatment plan for underlying cause of condition will improve Outcome: Progressing

## 2022-06-14 NOTE — Progress Notes (Signed)
Pt denies SI/HI/AVH and verbally agrees to approach staff if these become apparent or before harming themselves/others. Rates depression 5/10. Rates anxiety 0/10. Rates pain 0/10. Pt has been in his room most of the day but will come out for meals. Pt is very minimal. Scheduled medications administered to pt, per MD orders. RN provided support and encouragement to pt. Q15 min safety checks implemented and continued. Pt safe on the unit. RN will continue to monitor and intervene as needed.  06/14/22 0823  Psych Admission Type (Psych Patients Only)  Admission Status Involuntary  Psychosocial Assessment  Patient Complaints Depression  Eye Contact Fair  Facial Expression Sad  Affect Depressed;Flat  Speech Logical/coherent  Interaction Minimal  Motor Activity Slow  Appearance/Hygiene Disheveled;Poor hygiene  Behavior Characteristics Cooperative;Appropriate to situation;Calm  Mood Depressed;Pleasant  Aggressive Behavior  Effect No apparent injury  Thought Process  Coherency WDL  Content WDL  Delusions WDL;None reported or observed  Perception WDL  Hallucination None reported or observed  Judgment WDL  Confusion None  Danger to Self  Current suicidal ideation? Denies  Danger to Others  Danger to Others None reported or observed

## 2022-06-14 NOTE — Group Note (Signed)
[facility] LCSW Group Therapy Note   Group Date: 06/14/2022 Start Time: 1300 End Time: 1400   Type of Therapy/Topic:  Group Therapy:  Balance in Life  Participation Level:  Minimal   Description of Group:    This group will address the concept of balance and how it feels and looks when one is unbalanced. Patients will be encouraged to process areas in their lives that are out of balance, and identify reasons for remaining unbalanced. Facilitators will guide patients utilizing problem- solving interventions to address and correct the stressor making their life unbalanced. Understanding and applying boundaries will be explored and addressed for obtaining  and maintaining a balanced life. Patients will be encouraged to explore ways to assertively make their unbalanced needs known to significant others in their lives, using other group members and facilitator for support and feedback.  Therapeutic Goals: Patient will identify two or more emotions or situations they have that consume much of in their lives. Patient will identify signs/triggers that life has become out of balance:  Patient will identify two ways to set boundaries in order to achieve balance in their lives:  Patient will demonstrate ability to communicate their needs through discussion and/or role plays  Summary of Patient Progress: Patient was present for the entirety of the group process. He defined balance as being equal. Arguments were identified as things that could throw him off balance. He mentioned setting deadlines for oneself as a way to help maintain balance. His involvement in the discussion was minimal. Insight into the topic remains questionable.    Therapeutic Modalities:   Cognitive Behavioral Therapy Solution-Focused Therapy Assertiveness Training   Shirl Harris, LCSW

## 2022-06-14 NOTE — Plan of Care (Signed)
  Problem: Education: Goal: Knowledge of [place] [facility] Education information/materials will improve Outcome: Progressing Goal: Emotional status will improve Outcome: Progressing Goal: Mental status will improve Outcome: Progressing

## 2022-06-14 NOTE — BHH Counselor (Signed)
Adult Comprehensive Assessment  Patient ID: Ronald [name], male   DOB: November 09, 1995, 27 y.o.   MRN: [id number]  Information Source: Information source: Patient  Current Stressors:  Patient states their primary concerns and needs for treatment are:: "I get paranoid around my family, especialy when I am around them too long" Patient states their goals for this hospitilization and ongoing recovery are:: "To go to [facility] [employer] and groups daily" Educational / Learning stressors: Unable to assess. Employment / Job issues: Unable to assess. Family Relationships: Unable to assess. Financial / Lack of resources (include bankruptcy): Unable to assess. Housing / Lack of housing: Unable to assess. Physical health (include injuries & life threatening diseases): Unable to assess. Social relationships: Unable to assess. Substance abuse: Unable to assess. Bereavement / Loss: Unable to assess.   Living/Environment/Situation:  Living Arrangements: Parent, Other relatives Who else lives in the home?:"one parent" How long has patient lived in current situation?: "way too long." What is atmosphere in current home: Other(horrible)   Family History:  Marital status: Single Are you sexually active?: No What is your sexual orientation?: Bisexual Has your sexual activity been affected by drugs, alcohol, medication, or emotional stress?: N/A Does patient have children?: No   Childhood History:  By whom was/is the patient raised?: Mother/father and step-parent Description of patient's relationship with caregiver when they were a child: "horrible" Patient's description of current relationship with people who raised him/her: "It's better since I met my real father, mentally anyway. But not out there" How were you disciplined when you got in trouble as a child/adolescent?: Unable to assess. Does patient have siblings?: Yes Number of Siblings: 3  Description of patient's current relationship with siblings:  "Not good.  It's okay with two of them".   Did patient suffer any verbal/emotional/physical/sexual abuse as a child?: No Did patient suffer from severe childhood neglect?: No (Pt states, "Probably some neglect.") Has patient ever been sexually abused/assaulted/raped as an adolescent or adult?: No Was the patient ever a victim of a crime or a disaster?: No Witnessed domestic violence?: No Has patient been affected by domestic violence as an adult?: No   Education:  Highest grade of school patient has completed: High school graduate Currently a student?: No Learning disability?: No   Employment/Work Situation:   Employment Situation: Unemployed Patient's Job has Been Impacted by Current Illness: No What is the Longest Time Patient has Held a Job?: "A year or maybe like 10 months." Where was the Patient Employed at that Time?: [employer] Has Patient ever Been in the [employer]?: No   Financial Resources:   Museum/gallery curator resources: [employer], Support from parents / caregiver Does patient have a Programmer, applications or guardian?: No   Alcohol/Substance Abuse:   What has been your use of drugs/alcohol within the last 12 months?: "very little alcohol use"  If attempted suicide, did drugs/alcohol play a role in this?: No Alcohol/Substance Abuse Treatment Hx: Denies past history If yes, describe treatment: N/A Has alcohol/substance abuse ever caused legal problems?: No   Social Support System:   Heritage manager System: None Type of faith/religion: Islam How does patient's faith help to cope with current illness?: "just a little bit of Islam, it makes things a little more clear"   Leisure/Recreation:   Do You Have Hobbies?: "read, basketball"   Strengths/Needs:   What is the patient's perception of their strengths?: Unable to assess Patient states they can use these personal strengths during their treatment to contribute to their recovery: N/A  Patient states these barriers may  affect/interfere with their treatment: Pt denies Patient states these barriers may affect their return to the community: Pt denies Other important information patient would like considered in planning for their treatment: N/A   Discharge Plan:   Currently receiving community mental health services: No  Patient states concerns and preferences for aftercare planning are: He states he would like to be connected with [facility]. Patient states they will know when they are safe and ready for discharge when: Unable to assess Does patient have access to transportation?: Yes Does patient have financial barriers related to discharge medications?: No Patient description of barriers related to discharge medications: N/A Will patient be returning to same living situation after discharge?: Yes   Summary/Recommendations:   Summary and Recommendations (to be completed by the evaluator): Patient is a 27 year old, single, male from [place] [place] [facility] Patient was brought into the hospital with his mother for concerns for medication noncompliance and reported allegations. Patient was reserved and bit guarded when attempting to gather information to complete his assessment.  Patient reports that he has been triggered by the death of a parent, conflictual relationship with his surviving, siblings and extended family. He reports that he does not have any support and no peers at this time.  He reports that he is not current with outpatient mental health care, however, is open to a referral at discharge. Recommendations include crisis stabilization, therapeutic milieu, encourage group attendance and participation, medication management for mood stabilization, and development of comprehensive mental wellness/sobriety plan.  Rozann Lesches. 06/14/2022

## 2022-06-14 NOTE — BHH Group Notes (Signed)
[facility] Group Notes:  (Nursing/MHT/Case Management/Adjunct)  Date:  06/14/2022  Time:  9:55 PM  Type of Therapy:   Wrap up  Participation Level:  Active  Participation Quality:  Appropriate  Affect:  Appropriate  Cognitive:  Alert  Insight:  Good  Engagement in Group:  Engaged and his goal is to go to group.  Modes of Intervention:  Support  Summary of Progress/Problems:  Nehemiah Settle 06/14/2022, 9:55 PM

## 2022-06-14 NOTE — Progress Notes (Signed)
Patient isolative to his room.  Has  been asleep since the beginning of the shift.  Had to be awakened to get both snack and evening meds.  He is pleasant and cooperative. Has no issues or concerns that need to be addressed this evening. Denies si  hi  avh.  Presents flat and depressed. Will continue to monitor with q15 minute safety checks.      C Butler-Nicholson, LPN

## 2022-06-14 NOTE — BHH Suicide Risk Assessment (Signed)
[facility] INPATIENT:  Family/Significant Other Suicide Prevention Education  Suicide Prevention Education:  Patient Refusal for Family/Significant Other Suicide Prevention Education: The patient Ronald [name] has refused to provide written consent for family/significant other to be provided Family/Significant Other Suicide Prevention Education during admission and/or prior to discharge.  Physician notified.  SPE completed with pt, as pt refused to consent to family contact. SPI pamphlet provided to pt and pt was encouraged to share information with support network, ask questions, and talk about any concerns relating to SPE. Pt denies access to guns/firearms and verbalized understanding of information provided. Mobile Crisis information also provided to pt.   Rozann Lesches 06/14/2022, 3:30 PM

## 2022-06-15 NOTE — Progress Notes (Signed)
[facility] MD Progress Note  06/15/2022 4:34 PM Ronald [name]  MRN:  [id number] Subjective: Patient seen and chart reviewed.  Patient more interactive has his eyes open more talks a little bit more.  Still withdrawn a lot.  Denies hallucinations but stays in bed quite a bit little interaction with others.  Taking his medicine. Principal Problem: Schizophrenia ([place]) Diagnosis: Principal Problem:   Schizophrenia ([place])  Total Time spent with patient: 20 minutes  Past Psychiatric History: Past history of longstanding schizophrenia and substances  Past Medical History:  Past Medical History:  Diagnosis Date   Asthma    History reviewed. No pertinent surgical history. Family History: History reviewed. No pertinent family history. Family Psychiatric  History: See previous Social History:  Social History   Substance and Sexual Activity  Alcohol Use Yes   Alcohol/week: 2.0 standard drinks of alcohol   Types: 2 Cans of beer per week   Comment: every other week     Social History   Substance and Sexual Activity  Drug Use Yes   Types: Marijuana   Comment: weekly    Social History   Socioeconomic History   Marital status: Single    Spouse name: Not on file   Number of children: Not on file   Years of education: Not on file   Highest education level: Not on file  Occupational History   Not on file  Tobacco Use   Smoking status: Every Day    Packs/day: 1.00    Types: Cigarettes   Smokeless tobacco: Never  Vaping Use   Vaping Use: Never used  Substance and Sexual Activity   Alcohol use: Yes    Alcohol/week: 2.0 standard drinks of alcohol    Types: 2 Cans of beer per week    Comment: every other week   Drug use: Yes    Types: Marijuana    Comment: weekly   Sexual activity: Yes    Partners: Female    Birth control/protection: Condom  Other Topics Concern   Not on file  Social History Narrative   ** Merged History Encounter **       Social Determinants of Health    Financial Resource Strain: Not on file  Food Insecurity: Unknown (06/12/2022)   Hunger Vital Sign    Worried About Running Out of Food in the Last Year: Patient refused    Ran Out of Food in the Last Year: Patient refused  Transportation Needs: Unknown (06/12/2022)   PRAPARE - Hydrologist (Medical): Patient refused    Lack of Transportation (Non-Medical): Patient refused  Physical Activity: Not on file  Stress: Not on file  Social Connections: Not on file   Additional Social History:                         Sleep: Fair  Appetite:  Fair  Current Medications: Current Facility-Administered Medications  Medication Dose Route Frequency Provider Last Rate Last Admin   acetaminophen (TYLENOL) tablet 650 mg  650 mg Oral Q6H PRN Sherlon Handing, NP       alum & mag hydroxide-simeth (MAALOX/MYLANTA) 200-200-20 MG/5ML suspension 30 mL  30 mL Oral Q4H PRN Waldon Merl F, NP       benztropine (COGENTIN) tablet 1 mg  1 mg Oral QHS Mehar Kirkwood T, MD       hydrOXYzine (ATARAX) tablet 50 mg  50 mg Oral TID PRN Sherlon Handing, NP  magnesium hydroxide (MILK OF MAGNESIA) suspension 30 mL  30 mL Oral Daily PRN Gabriel Cirri F, NP       risperiDONE (RISPERDAL M-TABS) disintegrating tablet 4 mg  4 mg Oral QHS Alonzo Loving T, MD       traZODone (DESYREL) tablet 50 mg  50 mg Oral QHS PRN Vanetta Mulders, NP        Lab Results: No results found for this or any previous visit (from the past 48 hour(s)).  Blood Alcohol level:  Lab Results  Component Value Date   ETH <10 06/11/2022   ETH <10 12/18/2021    Metabolic Disorder Labs: Lab Results  Component Value Date   HGBA1C 5.1 11/02/2021   MPG 99.67 11/02/2021   Lab Results  Component Value Date   PROLACTIN 22.8 (H) 05/08/2019   Lab Results  Component Value Date   CHOL 141 11/02/2021   TRIG 93 11/02/2021   HDL 69 11/02/2021   CHOLHDL 2.0 11/02/2021   VLDL 19 11/02/2021    LDLCALC 53 11/02/2021   LDLCALC 56 05/08/2019    Physical Findings: AIMS: Facial and Oral Movements Muscles of Facial Expression: None, normal Lips and Perioral Area: None, normal Jaw: None, normal Tongue: None, normal,Extremity Movements Upper (arms, wrists, hands, fingers): None, normal Lower (legs, knees, ankles, toes): None, normal, Trunk Movements Neck, shoulders, hips: None, normal, Overall Severity Severity of abnormal movements (highest score from questions above): None, normal Incapacitation due to abnormal movements: None, normal Patient's awareness of abnormal movements (rate only patient's report): No Awareness, Dental Status Current problems with teeth and/or dentures?: No Does patient usually wear dentures?: No  CIWA:    COWS:     Musculoskeletal: Strength & Muscle Tone: within normal limits Gait & Station: normal Patient leans: N/A  Psychiatric Specialty Exam:  Presentation  General Appearance:  Other (comment) (Sleepy)  Eye Contact: Poor  Speech: Garbled  Speech Volume: Decreased  Handedness: Right   Mood and Affect  Mood: Euthymic  Affect: Congruent   Thought Process  Thought Processes: Goal Directed  Descriptions of Associations:Loose  Orientation:Full (Time, Place and Person)  Thought Content:Delusions  History of Schizophrenia/Schizoaffective disorder:Yes  Duration of Psychotic Symptoms:Greater than six months  Hallucinations:No data recorded Ideas of Reference:-- (No evidence of delusions noted or reported.)  Suicidal Thoughts:No data recorded Homicidal Thoughts:No data recorded  Sensorium  Memory: Immediate Fair  Judgment: Impaired  Insight: Lacking   Executive Functions  Concentration: Poor  Attention Span: Fair  Recall: Fair  Fund of Knowledge: Poor  Language: Fair   Psychomotor Activity  Psychomotor Activity:No data recorded  Assets  Assets: Financial Resources/Insurance; Housing; Social  Support; Transportation   Sleep  Sleep:No data recorded   Physical Exam: Physical Exam Constitutional:      Appearance: Normal appearance.  HENT:     Head: Normocephalic and atraumatic.     Mouth/Throat:     Pharynx: Oropharynx is clear.  Eyes:     Pupils: Pupils are equal, round, and reactive to light.  Cardiovascular:     Rate and Rhythm: Normal rate and regular rhythm.  Pulmonary:     Effort: Pulmonary effort is normal.     Breath sounds: Normal breath sounds.  Abdominal:     General: Abdomen is flat.     Palpations: Abdomen is soft.  Musculoskeletal:        General: Normal range of motion.  Skin:    General: Skin is warm and dry.  Neurological:     General:  No focal deficit present.     Mental Status: He is alert. Mental status is at baseline.  Psychiatric:        Attention and Perception: Attention normal.        Mood and Affect: Mood normal. Affect is blunt.        Speech: Speech normal.        Behavior: Behavior normal.        Thought Content: Thought content normal.        Cognition and Memory: Cognition is impaired.    Review of Systems  Constitutional: Negative.   HENT: Negative.    Eyes: Negative.   Respiratory: Negative.    Cardiovascular: Negative.   Gastrointestinal: Negative.   Musculoskeletal: Negative.   Skin: Negative.   Neurological: Negative.   Psychiatric/Behavioral: Negative.     Blood pressure 95/69, pulse 80, temperature 98.7 F (37.1 C), temperature source Oral, resp. rate 18, height 5\' 11"  (1.803 m), weight 64 kg, SpO2 95 %. Body mass index is 19.68 kg/m.   Treatment Plan Summary: Medication management and Plan no change to medication.  Anticipate a likely discharge by Monday if he continues to improve.  Alethia Berthold, MD 06/15/2022, 4:34 PM

## 2022-06-15 NOTE — Progress Notes (Signed)
Isolative to self. Came out for meal meds and group.Quiet, Denies SI, HI, AVh. Minimal interaction with staff and peers. No complaints voiced.  Encouragement and support provided. Safety checks maintained. Medications given as prescribed. Pt receptive and remains safe on unit with q 15 min checks.

## 2022-06-15 NOTE — Progress Notes (Addendum)
Pt denies SI/HI/AVH and verbally agrees to approach staff if these become apparent or before harming themselves/others. Rates depression 5/10. Rates anxiety 0/10. Rates pain 0/10. Pt has been in his room all day. Pt was on the phone in the evening for quite a while and seemed to get frustrated at times. Scheduled medications administered to pt, per MD orders. RN provided support and encouragement to pt. Q15 min safety checks implemented and continued. Pt safe on the unit. RN will continue to monitor and intervene as needed.  06/15/22 0900  Psych Admission Type (Psych Patients Only)  Admission Status Involuntary  Psychosocial Assessment  Patient Complaints Depression  Eye Contact Fair  Facial Expression Sad  Affect Depressed  Speech Logical/coherent  Interaction Isolative;Minimal  Motor Activity Other (Comment) (WDL)  Appearance/Hygiene Poor hygiene;Disheveled  Behavior Characteristics Cooperative;Appropriate to situation;Calm  Mood Depressed;Pleasant  Aggressive Behavior  Effect No apparent injury  Thought Process  Coherency WDL  Content WDL  Delusions WDL;None reported or observed  Perception WDL  Hallucination None reported or observed  Judgment WDL  Confusion None  Danger to Self  Current suicidal ideation? Denies  Danger to Others  Danger to Others None reported or observed

## 2022-06-15 NOTE — Plan of Care (Signed)
  Problem: Education: Goal: Knowledge of General Education information will improve Description: Including pain rating scale, medication(s)/side effects and non-pharmacologic comfort measures Outcome: Progressing   Problem: Coping: Goal: Level of anxiety will decrease Outcome: Progressing   Problem: Coping: Goal: Ability to demonstrate self-control will improve Outcome: Progressing   Problem: Education: Goal: Emotional status will improve Outcome: Not Progressing Goal: Mental status will improve Outcome: Not Progressing

## 2022-06-15 NOTE — Group Note (Signed)
LCSW Group Therapy Note  Group Date: 06/15/2022 Start Time: 1300 End Time: 1400   Type of Therapy and Topic:  Group Therapy - Healthy vs Unhealthy Coping Skills  Participation Level:  Minimal   Description of Group The focus of this group was to determine what unhealthy coping techniques typically are used by group members and what healthy coping techniques would be helpful in coping with various problems. Patients were guided in becoming aware of the differences between healthy and unhealthy coping techniques. Patients were asked to identify 2-3 healthy coping skills they would like to learn to use more effectively.  Therapeutic Goals Patients learned that coping is what human beings do all day long to deal with various situations in their lives Patients defined and discussed healthy vs unhealthy coping techniques Patients identified their preferred coping techniques and identified whether these were healthy or unhealthy Patients determined 2-3 healthy coping skills they would like to become more familiar with and use more often. Patients provided support and ideas to each other   Summary of Patient Progress:    Patient was present for the entirety of group session. Patient participated in opening and closing remarks. However, patient did not contribute at all to the topic of discussion despite encouraged participation.    Therapeutic Modalities Cognitive Behavioral Therapy Motivational Interviewing  Larose Kells 06/15/2022  2:50 PM

## 2022-06-15 NOTE — Plan of Care (Signed)
  Problem: Coping: ?Goal: Level of anxiety will decrease ?Outcome: Progressing ?  ?Problem: Safety: ?Goal: Ability to remain free from injury will improve ?Outcome: Progressing ?  ?

## 2022-06-16 MED ORDER — BENZTROPINE MESYLATE 1 MG PO TABS: 1.0000 mg | ORAL_TABLET | Freq: Every day | ORAL | 1 refills | Status: DC

## 2022-06-16 MED ORDER — RISPERIDONE 4 MG PO TBDP: 4.0000 mg | ORAL_TABLET | Freq: Every day | ORAL | 1 refills | Status: DC

## 2022-06-16 MED ORDER — TRAZODONE HCL 50 MG PO TABS
50.0000 mg | ORAL_TABLET | Freq: Every evening | ORAL | 0 refills | Status: DC | PRN
  Filled 2022-06-16: qty 30, 30d supply, fill #0

## 2022-06-16 MED ORDER — BENZTROPINE MESYLATE 1 MG PO TABS
1.0000 mg | ORAL_TABLET | Freq: Every day | ORAL | 0 refills | Status: DC
  Filled 2022-06-16: qty 30, 30d supply, fill #0

## 2022-06-16 MED ORDER — NICOTINE POLACRILEX 2 MG MT GUM
2.0000 mg | CHEWING_GUM | OROMUCOSAL | 0 refills | Status: DC | PRN
  Filled 2022-06-16: qty 110, 30d supply, fill #0

## 2022-06-16 MED ORDER — RISPERIDONE 2 MG PO TABS
4.0000 mg | ORAL_TABLET | Freq: Every day | ORAL | 0 refills | Status: DC
  Filled 2022-06-16: qty 60, 30d supply, fill #0

## 2022-06-16 MED ORDER — TRAZODONE HCL 50 MG PO TABS: 50.0000 mg | ORAL_TABLET | Freq: Every evening | ORAL | 1 refills | Status: DC | PRN

## 2022-06-16 MED ORDER — NICOTINE POLACRILEX 2 MG MT GUM: 2.0000 mg | CHEWING_GUM | OROMUCOSAL | 1 refills | Status: DC | PRN

## 2022-06-16 MED ORDER — HYDROXYZINE HCL 50 MG PO TABS
50.0000 mg | ORAL_TABLET | Freq: Three times a day (TID) | ORAL | 0 refills | Status: DC | PRN
  Filled 2022-06-16: qty 60, 20d supply, fill #0

## 2022-06-16 MED ORDER — HYDROXYZINE HCL 50 MG PO TABS: 50.0000 mg | ORAL_TABLET | Freq: Three times a day (TID) | ORAL | 1 refills | Status: DC | PRN

## 2022-06-16 NOTE — [facility] Group Notes (Signed)
LCSW Group Therapy Note   06/16/2022 1:15pm   Type of Therapy and Topic:  Group Therapy:  Overcoming Obstacles   Participation Level:  Active   Description of Group:    In this group patients will be encouraged to explore what they see as obstacles to their own wellness and recovery. They will be guided to discuss their thoughts, feelings, and behaviors related to these obstacles. The group will process together ways to cope with barriers, with attention given to specific choices patients can make. Each patient will be challenged to identify changes they are motivated to make in order to overcome their obstacles. This group will be process-oriented, with patients participating in exploration of their own experiences as well as giving and receiving support and challenge from other group members.   Therapeutic Goals: Patient will identify personal and current obstacles as they relate to admission. Patient will identify barriers that currently interfere with their wellness or overcoming obstacles.  Patient will identify feelings, thought process and behaviors related to these barriers. Patient will identify two changes they are willing to make to overcome these obstacles:      Summary of Patient Progress: pt active, attentive in group, made good contributions to the group discussion.  Pt identified "debates" with his mother as an obstacle, reports they often end with the police being called and he ends up at [facility].  Pt also identified drug use as an obstacle.  Pt identified one thing he could do to help as "making a plan" when things start to get heated with his mother.       Therapeutic Modalities:   Cognitive Behavioral Therapy Solution Focused Therapy Motivational Interviewing Relapse Prevention Therapy  Joanne Chars, LCSW 06/16/2022 2:44 PM

## 2022-06-16 NOTE — Plan of Care (Signed)
  Problem: Nutrition: °Goal: Adequate nutrition will be maintained °Outcome: Progressing °  °Problem: Coping: °Goal: Level of anxiety will decrease °Outcome: Progressing °  °Problem: Safety: °Goal: Ability to remain free from injury will improve °Outcome: Progressing °  °

## 2022-06-16 NOTE — Progress Notes (Signed)
Patient is A+O x 4. He denies SI/HI/AVH. Denies depression and anxiety. Denies pain. Patient was in and out of room using the phone and being active throughout the hallways. He showed positive interaction with staff and other patients. Patient had no complaints and stated that he would be discharging on Monday.  No scheduled meds on this shift. No PRN's adm.  Q15 minute unit checks in place.

## 2022-06-16 NOTE — Progress Notes (Signed)
Patient refused Risperdal due to reports of side effects during the night. He denies SI, HI & AVH. He does endorse depression although. He interacted well with peers and staff on shift. He seemed to sleep well through out the night.

## 2022-06-16 NOTE — Progress Notes (Signed)
[facility] MD Progress Note  06/16/2022 12:51 PM Ronald [name]  MRN:  [id number] Subjective: Follow-up 27 year old man with schizophrenia.  Patient seen and chart reviewed.  Patient says he is feeling better today.  Almost back to normal.  Hallucinations are not present.  He is less paranoid and less anxious and has been up out of his bed taking care of himself and interacting more.  Continues to state that he would like to follow-up with outpatient treatment after discharge. Principal Problem: Schizophrenia ([place]) Diagnosis: Principal Problem:   Schizophrenia ([place])  Total Time spent with patient: 30 minutes  Past Psychiatric History: Past history of schizophrenia  Past Medical History:  Past Medical History:  Diagnosis Date   Asthma    History reviewed. No pertinent surgical history. Family History: History reviewed. No pertinent family history. Family Psychiatric  History: See previous Social History:  Social History   Substance and Sexual Activity  Alcohol Use Yes   Alcohol/week: 2.0 standard drinks of alcohol   Types: 2 Cans of beer per week   Comment: every other week     Social History   Substance and Sexual Activity  Drug Use Yes   Types: Marijuana   Comment: weekly    Social History   Socioeconomic History   Marital status: Single    Spouse name: Not on file   Number of children: Not on file   Years of education: Not on file   Highest education level: Not on file  Occupational History   Not on file  Tobacco Use   Smoking status: Every Day    Packs/day: 1.00    Types: Cigarettes   Smokeless tobacco: Never  Vaping Use   Vaping Use: Never used  Substance and Sexual Activity   Alcohol use: Yes    Alcohol/week: 2.0 standard drinks of alcohol    Types: 2 Cans of beer per week    Comment: every other week   Drug use: Yes    Types: Marijuana    Comment: weekly   Sexual activity: Yes    Partners: Female    Birth control/protection: Condom  Other Topics Concern    Not on file  Social History Narrative   ** Merged History Encounter **       Social Determinants of Health   Financial Resource Strain: Not on file  Food Insecurity: Unknown (06/12/2022)   Hunger Vital Sign    Worried About Running Out of Food in the Last Year: Patient refused    Ran Out of Food in the Last Year: Patient refused  Transportation Needs: Unknown (06/12/2022)   PRAPARE - Hydrologist (Medical): Patient refused    Lack of Transportation (Non-Medical): Patient refused  Physical Activity: Not on file  Stress: Not on file  Social Connections: Not on file   Additional Social History:                         Sleep: Fair  Appetite:  Fair  Current Medications: Current Facility-Administered Medications  Medication Dose Route Frequency Provider Last Rate Last Admin   acetaminophen (TYLENOL) tablet 650 mg  650 mg Oral Q6H PRN Waldon Merl F, NP       alum & mag hydroxide-simeth (MAALOX/MYLANTA) 200-200-20 MG/5ML suspension 30 mL  30 mL Oral Q4H PRN Waldon Merl F, NP       benztropine (COGENTIN) tablet 1 mg  1 mg Oral QHS Antoine Vandermeulen, Madie Reno, MD  1 mg at 06/15/22 2118   hydrOXYzine (ATARAX) tablet 50 mg  50 mg Oral TID PRN Vanetta Mulders, NP       magnesium hydroxide (MILK OF MAGNESIA) suspension 30 mL  30 mL Oral Daily PRN Gabriel Cirri F, NP       risperiDONE (RISPERDAL M-TABS) disintegrating tablet 4 mg  4 mg Oral QHS Ahilyn Nell T, MD       traZODone (DESYREL) tablet 50 mg  50 mg Oral QHS PRN Vanetta Mulders, NP   50 mg at 06/15/22 2118    Lab Results: No results found for this or any previous visit (from the past 48 hour(s)).  Blood Alcohol level:  Lab Results  Component Value Date   ETH <10 06/11/2022   ETH <10 12/18/2021    Metabolic Disorder Labs: Lab Results  Component Value Date   HGBA1C 5.1 11/02/2021   MPG 99.67 11/02/2021   Lab Results  Component Value Date   PROLACTIN 22.8 (H) 05/08/2019    Lab Results  Component Value Date   CHOL 141 11/02/2021   TRIG 93 11/02/2021   HDL 69 11/02/2021   CHOLHDL 2.0 11/02/2021   VLDL 19 11/02/2021   LDLCALC 53 11/02/2021   LDLCALC 56 05/08/2019    Physical Findings: AIMS: Facial and Oral Movements Muscles of Facial Expression: None, normal Lips and Perioral Area: None, normal Jaw: None, normal Tongue: None, normal,Extremity Movements Upper (arms, wrists, hands, fingers): None, normal Lower (legs, knees, ankles, toes): None, normal, Trunk Movements Neck, shoulders, hips: None, normal, Overall Severity Severity of abnormal movements (highest score from questions above): None, normal Incapacitation due to abnormal movements: None, normal Patient's awareness of abnormal movements (rate only patient's report): No Awareness, Dental Status Current problems with teeth and/or dentures?: No Does patient usually wear dentures?: No  CIWA:    COWS:     Musculoskeletal: Strength & Muscle Tone: within normal limits Gait & Station: normal Patient leans: N/A  Psychiatric Specialty Exam:  Presentation  General Appearance:  Other (comment) (Sleepy)  Eye Contact: Poor  Speech: Garbled  Speech Volume: Decreased  Handedness: Right   Mood and Affect  Mood: Euthymic  Affect: Congruent   Thought Process  Thought Processes: Goal Directed  Descriptions of Associations:Loose  Orientation:Full (Time, Place and Person)  Thought Content:Delusions  History of Schizophrenia/Schizoaffective disorder:Yes  Duration of Psychotic Symptoms:Greater than six months  Hallucinations:No data recorded Ideas of Reference:-- (No evidence of delusions noted or reported.)  Suicidal Thoughts:No data recorded Homicidal Thoughts:No data recorded  Sensorium  Memory: Immediate Fair  Judgment: Impaired  Insight: Lacking   Executive Functions  Concentration: Poor  Attention Span: Fair  Recall: Fair  Fund of  Knowledge: Poor  Language: Fair   Psychomotor Activity  Psychomotor Activity:No data recorded  Assets  Assets: Financial Resources/Insurance; Housing; Social Support; Transportation   Sleep  Sleep:No data recorded   Physical Exam: Physical Exam Vitals and nursing note reviewed.  Constitutional:      Appearance: Normal appearance.  HENT:     Head: Normocephalic and atraumatic.     Mouth/Throat:     Pharynx: Oropharynx is clear.  Eyes:     Pupils: Pupils are equal, round, and reactive to light.  Cardiovascular:     Rate and Rhythm: Normal rate and regular rhythm.  Pulmonary:     Effort: Pulmonary effort is normal.     Breath sounds: Normal breath sounds.  Abdominal:     General: Abdomen is flat.  Palpations: Abdomen is soft.  Musculoskeletal:        General: Normal range of motion.  Skin:    General: Skin is warm and dry.  Neurological:     General: No focal deficit present.     Mental Status: He is alert. Mental status is at baseline.  Psychiatric:        Attention and Perception: Attention normal.        Mood and Affect: Mood normal.        Speech: Speech normal.        Behavior: Behavior is cooperative.        Thought Content: Thought content normal.        Cognition and Memory: Cognition normal.    Review of Systems  Constitutional: Negative.   HENT: Negative.    Eyes: Negative.   Respiratory: Negative.    Cardiovascular: Negative.   Gastrointestinal: Negative.   Musculoskeletal: Negative.   Skin: Negative.   Neurological: Negative.   Psychiatric/Behavioral: Negative.     Blood pressure 111/64, pulse 89, temperature 98.6 F (37 C), temperature source Oral, resp. rate 18, height 5\' 11"  (1.803 m), weight 64 kg, SpO2 100 %. Body mass index is 19.68 kg/m.   Treatment Plan Summary: Medication management and Plan no change to medication.  I am going to start making preparations so that we will have things ready for likely discharge Monday  Alethia Berthold, MD 06/16/2022, 12:51 PM

## 2022-06-16 NOTE — Plan of Care (Signed)
   Problem: Health Behavior/Discharge Planning: Goal: Compliance with treatment plan for underlying cause of condition will improve Outcome: Progressing   Problem: Coping: Goal: Ability to demonstrate self-control will improve Outcome: Progressing   Problem: Activity: Goal: Sleeping patterns will improve Outcome: Progressing   Problem: Activity: Goal: Interest or engagement in activities will improve Outcome: Progressing Goal: Sleeping patterns will improve Outcome: Progressing   Problem: Education: Goal: Verbalization of understanding the information provided will improve Outcome: Progressing   Problem: Education: Goal: Mental status will improve Outcome: Progressing   Problem: Education: Goal: Emotional status will improve Outcome: Progressing

## 2022-06-17 NOTE — Progress Notes (Signed)
Patient is much better. More social with both staff and his peers. He is med compliant and received his meds without issue. Denies si hi avh depression anxiety and pain at this encounter. Will continue to monitor with q 15 minute safety check.   C Butler-Nicholson, LPN

## 2022-06-17 NOTE — Plan of Care (Signed)
  Problem: Education: Goal: Knowledge of [facility] Education information/materials will improve Outcome: Progressing Goal: Emotional status will improve Outcome: Progressing Goal: Mental status will improve Outcome: Progressing

## 2022-06-17 NOTE — [facility] Group Notes (Signed)
[facility] Group Notes:  (Nursing/MHT/Case Management/Adjunct)  Date:  06/17/2022  Time:  11:06 AM  Type of Therapy:   community meeting  Participation Level:  Did Not Attend    [name] 06/17/2022, 11:06 AM

## 2022-06-17 NOTE — Progress Notes (Signed)
Pt denies SI/HI/AVH and verbally agrees to approach staff if these become apparent or before harming themselves/others. Rates depression 3/10. Rates anxiety 3/10. Rates pain 0/10.  Pt denied dep and anxiety with RN but wrote that he was having some on his self-inventory. Pt has been in his room for most of the day. Pt was in the dayroom in the evening for a little bit. Scheduled medications administered to pt, per MD orders. RN provided support and encouragement to pt. Q15 min safety checks implemented and continued. Pt safe on the unit. RN will continue to monitor and intervene as needed.  06/17/22 0830  Psych Admission Type (Psych Patients Only)  Admission Status Voluntary  Psychosocial Assessment  Patient Complaints Anxiety;Depression  Eye Contact Fair  Facial Expression Flat  Affect Flat  Speech Logical/coherent  Interaction Isolative;Minimal  Motor Activity Other (Comment) (WDL)  Appearance/Hygiene In scrubs  Behavior Characteristics Cooperative;Appropriate to situation;Calm  Mood Depressed;Anxious  Aggressive Behavior  Effect No apparent injury  Thought Process  Coherency Disorganized  Content WDL  Delusions None reported or observed  Perception WDL  Hallucination None reported or observed  Judgment WDL  Confusion None  Danger to Self  Current suicidal ideation? Denies  Danger to Others  Danger to Others None reported or observed

## 2022-06-17 NOTE — Progress Notes (Signed)
[facility] MD Progress Note  06/17/2022 12:23 PM Ronald [name]  MRN:  [id number] Subjective: Patient seen and chart reviewed.  No new complaints.  Reports he is feeling better.  Denies hallucinations.  Tolerating medicine. Principal Problem: Schizophrenia ([place]) Diagnosis: Principal Problem:   Schizophrenia ([place])  Total Time spent with patient: 20 minutes  Past Psychiatric History: Past history of schizophrenia  Past Medical History:  Past Medical History:  Diagnosis Date   Asthma    History reviewed. No pertinent surgical history. Family History: History reviewed. No pertinent family history. Family Psychiatric  History: See previous Social History:  Social History   Substance and Sexual Activity  Alcohol Use Yes   Alcohol/week: 2.0 standard drinks of alcohol   Types: 2 Cans of beer per week   Comment: every other week     Social History   Substance and Sexual Activity  Drug Use Yes   Types: Marijuana   Comment: weekly    Social History   Socioeconomic History   Marital status: Single    Spouse name: Not on file   Number of children: Not on file   Years of education: Not on file   Highest education level: Not on file  Occupational History   Not on file  Tobacco Use   Smoking status: Every Day    Packs/day: 1.00    Types: Cigarettes   Smokeless tobacco: Never  Vaping Use   Vaping Use: Never used  Substance and Sexual Activity   Alcohol use: Yes    Alcohol/week: 2.0 standard drinks of alcohol    Types: 2 Cans of beer per week    Comment: every other week   Drug use: Yes    Types: Marijuana    Comment: weekly   Sexual activity: Yes    Partners: Female    Birth control/protection: Condom  Other Topics Concern   Not on file  Social History Narrative   ** Merged History Encounter **       Social Determinants of Health   Financial Resource Strain: Not on file  Food Insecurity: Unknown (06/12/2022)   Hunger Vital Sign    Worried About Running Out of Food in  the Last Year: Patient refused    [place] in the Last Year: Patient refused  Transportation Needs: Unknown (06/12/2022)   [place] - Hydrologist (Medical): Patient refused    Lack of Transportation (Non-Medical): Patient refused  Physical Activity: Not on file  Stress: Not on file  Social Connections: Not on file   Additional Social History:                         Sleep: Fair  Appetite:  Fair  Current Medications: Current Facility-Administered Medications  Medication Dose Route Frequency Provider Last Rate Last Admin   acetaminophen (TYLENOL) tablet 650 mg  650 mg Oral Q6H PRN Sherlon Handing, NP       alum & mag hydroxide-simeth (MAALOX/MYLANTA) 200-200-20 MG/5ML suspension 30 mL  30 mL Oral Q4H PRN Waldon Merl F, NP       benztropine (COGENTIN) tablet 1 mg  1 mg Oral QHS Nicci Vaughan T, MD   1 mg at 06/16/22 2109   hydrOXYzine (ATARAX) tablet 50 mg  50 mg Oral TID PRN Sherlon Handing, NP       magnesium hydroxide (MILK OF MAGNESIA) suspension 30 mL  30 mL Oral Daily PRN Sherlon Handing, NP  risperiDONE (RISPERDAL M-TABS) disintegrating tablet 4 mg  4 mg Oral QHS Demaree Liberto T, MD   4 mg at 06/16/22 2110   traZODone (DESYREL) tablet 50 mg  50 mg Oral QHS PRN Sherlon Handing, NP   50 mg at 06/16/22 2110    Lab Results: No results found for this or any previous visit (from the past 48 hour(s)).  Blood Alcohol level:  Lab Results  Component Value Date   ETH <10 06/11/2022   ETH <10 70/35/0093    Metabolic Disorder Labs: Lab Results  Component Value Date   HGBA1C 5.1 11/02/2021   MPG 99.67 11/02/2021   Lab Results  Component Value Date   PROLACTIN 22.8 (H) 05/08/2019   Lab Results  Component Value Date   CHOL 141 11/02/2021   TRIG 93 11/02/2021   HDL 69 11/02/2021   CHOLHDL 2.0 11/02/2021   VLDL 19 11/02/2021   LDLCALC 53 11/02/2021   LDLCALC 56 05/08/2019    Physical Findings: AIMS: Facial  and Oral Movements Muscles of Facial Expression: None, normal Lips and Perioral Area: None, normal Jaw: None, normal Tongue: None, normal,Extremity Movements Upper (arms, wrists, hands, fingers): None, normal Lower (legs, knees, ankles, toes): None, normal, Trunk Movements Neck, shoulders, hips: None, normal, Overall Severity Severity of abnormal movements (highest score from questions above): None, normal Incapacitation due to abnormal movements: None, normal Patient's awareness of abnormal movements (rate only patient's report): No Awareness, Dental Status Current problems with teeth and/or dentures?: No Does patient usually wear dentures?: No  CIWA:    COWS:     Musculoskeletal: Strength & Muscle Tone: within normal limits Gait & Station: normal Patient leans: N/A  Psychiatric Specialty Exam:  Presentation  General Appearance:  Other (comment) (Sleepy)  Eye Contact: Poor  Speech: Garbled  Speech Volume: Decreased  Handedness: Right   Mood and Affect  Mood: Euthymic  Affect: Congruent   Thought Process  Thought Processes: Goal Directed  Descriptions of Associations:Loose  Orientation:Full (Time, Place and Person)  Thought Content:Delusions  History of Schizophrenia/Schizoaffective disorder:Yes  Duration of Psychotic Symptoms:Greater than six months  Hallucinations:No data recorded Ideas of Reference:-- (No evidence of delusions noted or reported.)  Suicidal Thoughts:No data recorded Homicidal Thoughts:No data recorded  Sensorium  Memory: Immediate Fair  Judgment: Impaired  Insight: Lacking   Executive Functions  Concentration: Poor  Attention Span: Fair  Recall: [place] of Knowledge: Poor  Language: Fair   Psychomotor Activity  Psychomotor Activity:No data recorded  Assets  Assets: Financial Resources/Insurance; Housing; Social Support; Transportation   Sleep  Sleep:No data recorded   Physical  Exam: Physical Exam Vitals and nursing note reviewed.  Constitutional:      Appearance: Normal appearance.  HENT:     Head: Normocephalic and atraumatic.     Mouth/Throat:     Pharynx: Oropharynx is clear.  Eyes:     Pupils: Pupils are equal, round, and reactive to light.  Cardiovascular:     Rate and Rhythm: Normal rate and regular rhythm.  Pulmonary:     Effort: Pulmonary effort is normal.     Breath sounds: Normal breath sounds.  Abdominal:     General: Abdomen is flat.     Palpations: Abdomen is soft.  Musculoskeletal:        General: Normal range of motion.  Skin:    General: Skin is warm and dry.  Neurological:     General: No focal deficit present.     Mental Status: He is  alert. Mental status is at baseline.  Psychiatric:        Attention and Perception: Attention normal.        Mood and Affect: Mood normal.        Speech: Speech normal.        Behavior: Behavior normal.        Thought Content: Thought content normal.        Cognition and Memory: Cognition normal.    Review of Systems  Constitutional: Negative.   HENT: Negative.    Eyes: Negative.   Respiratory: Negative.    Cardiovascular: Negative.   Gastrointestinal: Negative.   Musculoskeletal: Negative.   Skin: Negative.   Neurological: Negative.   Psychiatric/Behavioral: Negative.     Blood pressure 112/88, pulse 73, temperature 98.2 F (36.8 C), temperature source Oral, resp. rate 18, height 5\' 11"  (1.803 m), weight 64 kg, SpO2 100 %. Body mass index is 19.68 kg/m.   Treatment Plan Summary: Plan no change to medication.  Plan is already in place for discharge tomorrow with follow-up at [facility].  [facility], MD 06/17/2022, 12:23 PM

## 2022-06-17 NOTE — Plan of Care (Signed)
  Problem: Education: Goal: Knowledge of General Education information will improve Description: Including pain rating scale, medication(s)/side effects and non-pharmacologic comfort measures Outcome: Progressing   Problem: Health Behavior/Discharge Planning: Goal: Ability to manage health-related needs will improve Outcome: Progressing   Problem: Safety: Goal: Periods of time without injury will increase Outcome: Progressing   Problem: Physical Regulation: Goal: Ability to maintain clinical measurements within normal limits will improve Outcome: Progressing   Problem: Health Behavior/Discharge Planning: Goal: Identification of resources available to assist in meeting health care needs will improve Outcome: Progressing Goal: Compliance with treatment plan for underlying cause of condition will improve Outcome: Progressing   Problem: Activity: Goal: Interest or engagement in activities will improve Outcome: Progressing Goal: Sleeping patterns will improve Outcome: Progressing

## 2022-06-18 ENCOUNTER — Other Ambulatory Visit: Payer: Self-pay

## 2022-06-18 NOTE — Progress Notes (Signed)
  [facility] Adult Case Management Discharge Plan :  Will you be returning to the same living situation after discharge:  Yes,  pt plans to return home. At discharge, do you have transportation home?: Yes,  he stated that his uncle may provide transportation  home. Do you have the ability to pay for your medications: Yes,  [facility].  Release of information consent forms completed and in the chart;  Patient's signature needed at discharge.  Patient to Follow up at:  Follow-up Information     [facility] Follow up.   Why: You are scheduled to meet with Lanae Boast, peer support specialist, on Wednesday, 06/20/22 at 7:30AM. Thanks! Contact information: [place] [address] [phone]                 Next level of care provider has access to [place] and Suicide Prevention discussed: Yes,  SPE completed with the patient.     Has patient been referred to the Quitline?: Patient refused referral  Patient has been referred for addiction treatment: Yes  Shirl Harris, LCSW 06/18/2022, 11:09 AM

## 2022-06-18 NOTE — [facility] Suicide Risk Assessment (Signed)
[facility] Discharge Suicide Risk Assessment   Principal Problem: Schizophrenia [facility]) Discharge Diagnoses: Principal Problem:   Schizophrenia ([place])   Total Time spent with patient: 30 minutes  Musculoskeletal: Strength & Muscle Tone: within normal limits Gait & Station: normal Patient leans: N/A  Psychiatric Specialty Exam  Presentation  General Appearance:  Other (comment) (Sleepy)  Eye Contact: Poor  Speech: Garbled  Speech Volume: Decreased  Handedness: Right   Mood and Affect  Mood: Euthymic  Duration of Depression Symptoms: No data recorded Affect: Congruent   Thought Process  Thought Processes: Goal Directed  Descriptions of Associations:Loose  Orientation:Full (Time, Place and Person)  Thought Content:Delusions  History of Schizophrenia/Schizoaffective disorder:Yes  Duration of Psychotic Symptoms:Greater than six months  Hallucinations:No data recorded Ideas of Reference:-- (No evidence of delusions noted or reported.)  Suicidal Thoughts:No data recorded Homicidal Thoughts:No data recorded  Sensorium  Memory: Immediate Fair  Judgment: Impaired  Insight: Lacking   Executive Functions  Concentration: Poor  Attention Span: Fair  Recall: [place] of Knowledge: Poor  Language: Fair   Psychomotor Activity  Psychomotor Activity:No data recorded  Assets  Assets: Financial Resources/Insurance; Housing; Social Support; Transportation   Sleep  Sleep:No data recorded  Physical Exam: Physical Exam Vitals and nursing note reviewed.  Constitutional:      Appearance: Normal appearance.  HENT:     Head: Normocephalic and atraumatic.     Mouth/Throat:     Pharynx: Oropharynx is clear.  Eyes:     Pupils: Pupils are equal, round, and reactive to light.  Cardiovascular:     Rate and Rhythm: Normal rate and regular rhythm.  Pulmonary:     Effort: Pulmonary effort is normal.     Breath sounds: Normal breath sounds.   Abdominal:     General: Abdomen is flat.     Palpations: Abdomen is soft.  Musculoskeletal:        General: Normal range of motion.  Skin:    General: Skin is warm and dry.  Neurological:     General: No focal deficit present.     Mental Status: He is alert. Mental status is at baseline.  Psychiatric:        Attention and Perception: Attention normal.        Mood and Affect: Mood normal.        Speech: Speech normal.        Behavior: Behavior normal.        Thought Content: Thought content normal.        Cognition and Memory: Cognition normal.        Judgment: Judgment normal.    Review of Systems  Constitutional: Negative.   HENT: Negative.    Eyes: Negative.   Respiratory: Negative.    Cardiovascular: Negative.   Gastrointestinal: Negative.   Musculoskeletal: Negative.   Skin: Negative.   Neurological: Negative.   Psychiatric/Behavioral: Negative.     Blood pressure 106/62, pulse 81, temperature 98.7 F (37.1 C), temperature source Oral, resp. rate 18, height 5\' 11"  (1.803 m), weight 64 kg, SpO2 100 %. Body mass index is 19.68 kg/m.  Mental Status Per Nursing Assessment::   On Admission:  NA  Demographic Factors:  Male  Loss Factors: NA  Historical Factors: NA  Risk Reduction Factors:   Sense of responsibility to family, Living with another person, especially a relative, and Positive social support  Continued Clinical Symptoms:  Schizophrenia:   Paranoid or undifferentiated type  Cognitive Features That Contribute To Risk:  None  Suicide Risk:  Minimal: No identifiable suicidal ideation.  Patients presenting with no risk factors but with morbid ruminations; may be classified as minimal risk based on the severity of the depressive symptoms   Follow-up Information     [facility] Follow up.   Why: You are scheduled to meet with Lanae Boast, peer support specialist, on Wednesday, 06/20/22 at 7:30AM. Thanks! Contact information: [place] [address] [phone]                 Plan Of Care/Follow-up recommendations:  Other:  Patient has shown no dangerous behavior during his time in the hospital and has denied suicidal or homicidal thought.  Currently denies psychotic symptoms.  He has been compliant with medicine and has repeatedly stated that he plans to follow-up with outpatient treatment through [facility].  Alethia Berthold, MD 06/18/2022, 9:46 AM

## 2022-06-18 NOTE — Group Note (Signed)
[facility] LCSW Group Therapy Note    Group Date: 06/18/2022 Start Time: 1300 End Time: 1400  Type of Therapy and Topic:  Group Therapy:  Overcoming Obstacles  Participation Level:  [facility] PARTICIPATION LEVEL: Did Not Attend  Mood:  Description of Group:   In this group patients will be encouraged to explore what they see as obstacles to their own wellness and recovery. They will be guided to discuss their thoughts, feelings, and behaviors related to these obstacles. The group will process together ways to cope with barriers, with attention given to specific choices patients can make. Each patient will be challenged to identify changes they are motivated to make in order to overcome their obstacles. This group will be process-oriented, with patients participating in exploration of their own experiences as well as giving and receiving support and challenge from other group members.  Therapeutic Goals: 1. Patient will identify personal and current obstacles as they relate to admission. 2. Patient will identify barriers that currently interfere with their wellness or overcoming obstacles.  3. Patient will identify feelings, thought process and behaviors related to these barriers. 4. Patient will identify two changes they are willing to make to overcome these obstacles:    Summary of Patient Progress   X   Therapeutic Modalities:   Cognitive Behavioral Therapy Solution Focused Therapy Motivational Interviewing Relapse Prevention Therapy   Savina Olshefski J Conall Vangorder, LCSW 

## 2022-06-18 NOTE — Progress Notes (Signed)
Patient pleasant and cooperative on approach. Denies SI,HI and AVH. Verbalized understanding discharge instructions and follow up care. 7 days medicines given to patient. All belongings from [facility] locker returned to patient. Patient escorted out by staff and transported by cab.

## 2022-06-18 NOTE — Progress Notes (Signed)
Recreation Therapy Notes    Date: 06/18/2022  Time: 10:30 am  Location: Craft room     Behavioral response: N/A   Intervention Topic: Time Management   Discussion/Intervention: Patient refused to attend group.   Clinical Observations/Feedback:  Patient refused to attend group.    Mishka Stegemann LRT/CTRS        Theora Vankirk 06/18/2022 1:38 PM

## 2022-06-18 NOTE — BH IP Treatment Plan (Signed)
Interdisciplinary Treatment and Diagnostic Plan Update  06/18/2022 Time of Session: 08:30 Ronald [name] MRN: [id number]  Principal Diagnosis: Schizophrenia [facility])  Secondary Diagnoses: Principal Problem:   Schizophrenia (HCC)   Current Medications:  Current Facility-Administered Medications  Medication Dose Route Frequency Provider Last Rate Last Admin   acetaminophen (TYLENOL) tablet 650 mg  650 mg Oral Q6H PRN Vanetta Mulders, NP       alum & mag hydroxide-simeth (MAALOX/MYLANTA) 200-200-20 MG/5ML suspension 30 mL  30 mL Oral Q4H PRN Gabriel Cirri F, NP       benztropine (COGENTIN) tablet 1 mg  1 mg Oral QHS Clapacs, John T, MD   1 mg at 06/17/22 2052   hydrOXYzine (ATARAX) tablet 50 mg  50 mg Oral TID PRN Vanetta Mulders, NP       magnesium hydroxide (MILK OF MAGNESIA) suspension 30 mL  30 mL Oral Daily PRN Gabriel Cirri F, NP       risperiDONE (RISPERDAL M-TABS) disintegrating tablet 4 mg  4 mg Oral QHS Clapacs, John T, MD   4 mg at 06/17/22 2053   traZODone (DESYREL) tablet 50 mg  50 mg Oral QHS PRN Vanetta Mulders, NP   50 mg at 06/17/22 2053   PTA Medications: Medications Prior to Admission  Medication Sig Dispense Refill Last Dose   benztropine (COGENTIN) 0.5 MG tablet Take 1 tablet (0.5 mg total) by mouth at bedtime. (Patient not taking: Reported on 06/11/2022) 10 tablet 0    risperiDONE (RISPERDAL M-TABS) 2 MG disintegrating tablet Take 1 tablet (2 mg total) by mouth 2 (two) times daily. (Patient not taking: Reported on 06/11/2022) 20 tablet 0    [DISCONTINUED] hydrOXYzine (ATARAX) 50 MG tablet Take 1 tablet (50 mg total) by mouth 3 (three) times daily as needed for anxiety (agitation). (Patient not taking: Reported on 06/11/2022) 30 tablet 0    [DISCONTINUED] nicotine polacrilex (NICORETTE) 2 MG gum Take 1 each (2 mg total) by mouth as needed for smoking cessation. (Patient not taking: Reported on 06/11/2022) 30 tablet 0    [DISCONTINUED] traZODone (DESYREL) 50 MG  tablet Take 1 tablet (50 mg total) by mouth at bedtime as needed for sleep. (Patient not taking: Reported on 06/11/2022) 10 tablet 0     Patient Stressors: Medication change or noncompliance    Patient Strengths: Ability for insight   Treatment Modalities: Medication Management, Group therapy, Case management,  1 to 1 session with clinician, Psychoeducation, Recreational therapy.   Physician Treatment Plan for Primary Diagnosis: Schizophrenia (HCC) Long Term Goal(s): Improvement in symptoms so as ready for discharge   Short Term Goals: Ability to maintain clinical measurements within normal limits will improve Compliance with prescribed medications will improve Ability to demonstrate self-control will improve Ability to identify and develop effective coping behaviors will improve  Medication Management: Evaluate patient's response, side effects, and tolerance of medication regimen.  Therapeutic Interventions: 1 to 1 sessions, Unit Group sessions and Medication administration.  Evaluation of Outcomes: Adequate for Discharge  Physician Treatment Plan for Secondary Diagnosis: Principal Problem:   Schizophrenia (HCC)  Long Term Goal(s): Improvement in symptoms so as ready for discharge   Short Term Goals: Ability to maintain clinical measurements within normal limits will improve Compliance with prescribed medications will improve Ability to demonstrate self-control will improve Ability to identify and develop effective coping behaviors will improve     Medication Management: Evaluate patient's response, side effects, and tolerance of medication regimen.  Therapeutic Interventions: 1 to 1 sessions, Unit Group  sessions and Medication administration.  Evaluation of Outcomes: Adequate for Discharge   RN Treatment Plan for Primary Diagnosis: Schizophrenia ([place]) Long Term Goal(s): Knowledge of disease and therapeutic regimen to maintain health will improve  Short Term Goals:  Ability to remain free from injury will improve, Ability to verbalize frustration and anger appropriately will improve, Ability to demonstrate self-control, Ability to participate in decision making will improve, Ability to verbalize feelings will improve, Ability to disclose and discuss suicidal ideas, Ability to identify and develop effective coping behaviors will improve, and Compliance with prescribed medications will improve  Medication Management: RN will administer medications as ordered by provider, will assess and evaluate patient's response and provide education to patient for prescribed medication. RN will report any adverse and/or side effects to prescribing provider.  Therapeutic Interventions: 1 on 1 counseling sessions, Psychoeducation, Medication administration, Evaluate responses to treatment, Monitor vital signs and CBGs as ordered, Perform/monitor CIWA, COWS, AIMS and Fall Risk screenings as ordered, Perform wound care treatments as ordered.  Evaluation of Outcomes: Adequate for Discharge   LCSW Treatment Plan for Primary Diagnosis: Schizophrenia [facility]) Long Term Goal(s): Safe transition to appropriate next level of care at discharge, Engage patient in therapeutic group addressing interpersonal concerns.  Short Term Goals: Engage patient in aftercare planning with referrals and resources, Increase social support, Increase ability to appropriately verbalize feelings, Increase emotional regulation, Facilitate acceptance of mental health diagnosis and concerns, and Increase skills for wellness and recovery  Therapeutic Interventions: Assess for all discharge needs, 1 to 1 time with Social worker, Explore available resources and support systems, Assess for adequacy in community support network, Educate family and significant other(s) on suicide prevention, Complete Psychosocial Assessment, Interpersonal group therapy.  Evaluation of Outcomes: Adequate for Discharge   Progress in  Treatment: Attending groups: Yes. Participating in groups: Yes. Taking medication as prescribed: Yes. Toleration medication: Yes. Family/Significant other contact made: No, will contact:  if given permission as pt declined collateral/familial contact. Patient understands diagnosis: Yes. Discussing patient identified problems/goals with staff: Yes. Medical problems stabilized or resolved: Yes. Denies suicidal/homicidal ideation: Yes. Issues/concerns per patient self-inventory: No. Other: none.  New problem(s) identified: No, Describe:  none identified.      Shirl Harris, LCSW Last edited by Shirl Harris, LCSW on 06/13/2022  9:49 AM EST       Interdisciplinary Treatment and Diagnostic Plan Update   06/13/2022 Time of Session: 09:27 Skylier Kretschmer MRN: [id number]   Principal Diagnosis: Schizophrenia [facility])   Secondary Diagnoses: Principal Problem:   Schizophrenia ([place])     Current Medications:           Current Facility-Administered Medications  Medication Dose Route Frequency Provider Last Rate Last Admin   acetaminophen (TYLENOL) tablet 650 mg  650 mg Oral Q6H PRN Sherlon Handing, NP       alum & mag hydroxide-simeth (MAALOX/MYLANTA) 200-200-20 MG/5ML suspension 30 mL  30 mL Oral Q4H PRN Waldon Merl F, NP       benztropine (COGENTIN) tablet 0.5 mg  0.5 mg Oral QHS Waldon Merl F, NP       benztropine (COGENTIN) tablet 0.5 mg  0.5 mg Oral BID Clapacs, John T, MD   0.5 mg at 06/13/22 9563   hydrOXYzine (ATARAX) tablet 50 mg  50 mg Oral TID PRN Sherlon Handing, NP       magnesium hydroxide (MILK OF MAGNESIA) suspension 30 mL  30 mL Oral Daily PRN Sherlon Handing, NP  risperiDONE (RISPERDAL M-TABS) disintegrating tablet 2 mg  2 mg Oral BID Gabriel Cirri F, NP       risperiDONE (RISPERDAL M-TABS) disintegrating tablet 2 mg  2 mg Oral BID Clapacs, Jackquline Denmark, MD   2 mg at 06/13/22 3500   traZODone (DESYREL) tablet 50 mg  50 mg Oral QHS PRN Vanetta Mulders,  NP        PTA Medications:        Medications Prior to Admission  Medication Sig Dispense Refill Last Dose   benztropine (COGENTIN) 0.5 MG tablet Take 1 tablet (0.5 mg total) by mouth at bedtime. (Patient not taking: Reported on 06/11/2022) 10 tablet 0     hydrOXYzine (ATARAX) 50 MG tablet Take 1 tablet (50 mg total) by mouth 3 (three) times daily as needed for anxiety (agitation). (Patient not taking: Reported on 06/11/2022) 30 tablet 0     nicotine polacrilex (NICORETTE) 2 MG gum Take 1 each (2 mg total) by mouth as needed for smoking cessation. (Patient not taking: Reported on 06/11/2022) 30 tablet 0     risperiDONE (RISPERDAL M-TABS) 2 MG disintegrating tablet Take 1 tablet (2 mg total) by mouth 2 (two) times daily. (Patient not taking: Reported on 06/11/2022) 20 tablet 0     traZODone (DESYREL) 50 MG tablet Take 1 tablet (50 mg total) by mouth at bedtime as needed for sleep. (Patient not taking: Reported on 06/11/2022) 10 tablet 0        Patient Stressors: Medication change or noncompliance     Patient Strengths: Ability for insight    Treatment Modalities: Medication Management, Group therapy, Case management,  1 to 1 session with clinician, Psychoeducation, Recreational therapy.     Physician Treatment Plan for Primary Diagnosis: Schizophrenia (HCC) Long Term Goal(s): Improvement in symptoms so as ready for discharge    Short Term Goals: Ability to maintain clinical measurements within normal limits will improve Compliance with prescribed medications will improve Ability to demonstrate self-control will improve Ability to identify and develop effective coping behaviors will improve   Medication Management: Evaluate patient's response, side effects, and tolerance of medication regimen.   Therapeutic Interventions: 1 to 1 sessions, Unit Group sessions and Medication administration.   Evaluation of Outcomes: Progressing   Physician Treatment Plan for Secondary Diagnosis: Principal  Problem:   Schizophrenia (HCC)   Long Term Goal(s): Improvement in symptoms so as ready for discharge    Short Term Goals: Ability to maintain clinical measurements within normal limits will improve Compliance with prescribed medications will improve Ability to demonstrate self-control will improve Ability to identify and develop effective coping behaviors will improve      Medication Management: Evaluate patient's response, side effects, and tolerance of medication regimen.   Therapeutic Interventions: 1 to 1 sessions, Unit Group sessions and Medication administration.   Evaluation of Outcomes: Progressing     RN Treatment Plan for Primary Diagnosis: Schizophrenia (HCC) Long Term Goal(s): Knowledge of disease and therapeutic regimen to maintain health will improve   Short Term Goals: Ability to remain free from injury will improve, Ability to verbalize frustration and anger appropriately will improve, Ability to demonstrate self-control, Ability to participate in decision making will improve, Ability to verbalize feelings will improve, Ability to disclose and discuss suicidal ideas, Ability to identify and develop effective coping behaviors will improve, and Compliance with prescribed medications will improve   Medication Management: RN will administer medications as ordered by provider, will assess and evaluate patient's response and provide education to  patient for prescribed medication. RN will report any adverse and/or side effects to prescribing provider.   Therapeutic Interventions: 1 on 1 counseling sessions, Psychoeducation, Medication administration, Evaluate responses to treatment, Monitor vital signs and CBGs as ordered, Perform/monitor CIWA, COWS, AIMS and Fall Risk screenings as ordered, Perform wound care treatments as ordered.   Evaluation of Outcomes: Progressing     LCSW Treatment Plan for Primary Diagnosis: Schizophrenia ([place]) Long Term Goal(s): Safe transition to  appropriate next level of care at discharge, Engage patient in therapeutic group addressing interpersonal concerns.   Short Term Goals: Engage patient in aftercare planning with referrals and resources, Increase social support, Increase ability to appropriately verbalize feelings, Increase emotional regulation, Facilitate acceptance of mental health diagnosis and concerns, and Increase skills for wellness and recovery   Therapeutic Interventions: Assess for all discharge needs, 1 to 1 time with Social worker, Explore available resources and support systems, Assess for adequacy in community support network, Educate family and significant other(s) on suicide prevention, Complete Psychosocial Assessment, Interpersonal group therapy.   Evaluation of Outcomes: Progressing     Progress in Treatment: Attending groups: No. Participating in groups: No. Taking medication as prescribed: Yes. Toleration medication: Yes. Family/Significant other contact made: No, will contact:  once given permission. Patient understands diagnosis: Yes. Discussing patient identified problems/goals with staff: Yes. Medical problems stabilized or resolved: Yes. Denies suicidal/homicidal ideation: Yes. Issues/concerns per patient self-inventory: No. Other: none.   New problem(s) identified: No, Describe:  none identified.   New Short Term/Long Term Goal(s): elimination of symptoms of psychosis, medication management for mood stabilization; elimination of SI thoughts; development of comprehensive mental wellness plan.    Patient Goals:  "Going to group everyday and answering the questions." Update 06/18/22: No changes at this time.   Discharge Plan or Barriers: CSW will assist pt with development of an appropriate aftercare/discharge plan. Update 06/18/22: Pt set to discharge today and return home with outpatient follow up through [facility].    Reason for Continuation of Hospitalization: Suicidal ideation Other; describe  Psychosis   Estimated Length of Stay: 1-7 days     Last 3 [place] Suicide Severity Risk Score: [place] Admission (Current) from 06/12/2022 in [place] ED from 06/11/2022 in [facility] Emergency Department at [facility] ED from 12/18/2021 in [facility] Emergency Department at [place] No Risk No Risk No Risk       Last PHQ 2/9 Scores:     No data to display          Scribe for Treatment Team: Shirl Harris, LCSW 06/18/2022 11:11 AM

## 2022-06-18 NOTE — Progress Notes (Signed)
Patient is pleasant and easy to engage in conversation.  He has been mostly isolative to his room, but does come out for meals and snack. He is med compliant and received his meds without issue. He is aware that he is going home tomorrow and appears to be ok with it. States that he is feeling better. Denies si/hi/avh. Endorses mild anxiety and depression that he says is his "norm". Staff offered support and encouragement. Q 15 minute safety checks in place.   C Butler-Nicholson, LPN

## 2022-06-18 NOTE — Discharge Summary (Signed)
Physician Discharge Summary Note  Patient:  Ronald [name] is an 27 y.o., male MRN:  [id number] DOB:  27-Aug-1995 Patient phone:  [phone] (home)  Patient address:   [address] [place] [address],  Total Time spent with patient: 30 minutes  Date of Admission:  06/12/2022 Date of Discharge: 06/18/2022  Reason for Admission: Patient was admitted because of a return of psychotic symptoms with bizarre behavior at home related to noncompliance with medicine  Principal Problem: Schizophrenia [facility]) Discharge Diagnoses: Principal Problem:   Schizophrenia [facility])   Past Psychiatric History: Established history of schizophrenia.  Repeated noncompliance with outpatient treatment.  Good response to medication in the past.  No history of violence.  Past Medical History:  Past Medical History:  Diagnosis Date   Asthma    History reviewed. No pertinent surgical history. Family History: History reviewed. No pertinent family history. Family Psychiatric  History: See previous Social History:  Social History   Substance and Sexual Activity  Alcohol Use Yes   Alcohol/week: 2.0 standard drinks of alcohol   Types: 2 Cans of beer per week   Comment: every other week     Social History   Substance and Sexual Activity  Drug Use Yes   Types: Marijuana   Comment: weekly    Social History   Socioeconomic History   Marital status: Single    Spouse name: Not on file   Number of children: Not on file   Years of education: Not on file   Highest education level: Not on file  Occupational History   Not on file  Tobacco Use   Smoking status: Every Day    Packs/day: 1.00    Types: Cigarettes   Smokeless tobacco: Never  Vaping Use   Vaping Use: Never used  Substance and Sexual Activity   Alcohol use: Yes    Alcohol/week: 2.0 standard drinks of alcohol    Types: 2 Cans of beer per week    Comment: every other week   Drug use: Yes    Types: Marijuana    Comment: weekly    Sexual activity: Yes    Partners: Female    Birth control/protection: Condom  Other Topics Concern   Not on file  Social History Narrative   ** Merged History Encounter **       Social Determinants of Health   Financial Resource Strain: Not on file  Food Insecurity: Unknown (06/12/2022)   Hunger Vital Sign    Worried About Running Out of Food in the Last Year: Patient refused    Ran Out of Food in the Last Year: Patient refused  Transportation Needs: Unknown (06/12/2022)   PRAPARE - Administrator, Civil Service (Medical): Patient refused    Lack of Transportation (Non-Medical): Patient refused  Physical Activity: Not on file  Stress: Not on file  Social Connections: Not on file    Hospital Course: Patient did not display any dangerous aggressive violent or suicidal behavior in the hospital.  He was compliant with treatment and was agreeable to continuing Risperdal at previous dosage.  Stayed isolated much of the time but became more interactive and more interpersonal towards the end of his stay.  Denied hallucinations.  Able to display appropriate behavior and he met with the representative from [facility] and agreed multiple times to follow-up with outpatient treatment.  Physical Findings: AIMS: Facial and Oral Movements Muscles of Facial Expression: None, normal Lips and Perioral Area: None, normal Jaw: None, normal  Tongue: None, normal,Extremity Movements Upper (arms, wrists, hands, fingers): None, normal Lower (legs, knees, ankles, toes): None, normal, Trunk Movements Neck, shoulders, hips: None, normal, Overall Severity Severity of abnormal movements (highest score from questions above): None, normal Incapacitation due to abnormal movements: None, normal Patient's awareness of abnormal movements (rate only patient's report): No Awareness, Dental Status Current problems with teeth and/or dentures?: No Does patient usually wear dentures?: No  CIWA:    COWS:      Musculoskeletal: Strength & Muscle Tone: within normal limits Gait & Station: normal Patient leans: N/A   Psychiatric Specialty Exam:  Presentation  General Appearance:  Other (comment) (Sleepy)  Eye Contact: Poor  Speech: Garbled  Speech Volume: Decreased  Handedness: Right   Mood and Affect  Mood: Euthymic  Affect: Congruent   Thought Process  Thought Processes: Goal Directed  Descriptions of Associations:Loose  Orientation:Full (Time, Place and Person)  Thought Content:Delusions  History of Schizophrenia/Schizoaffective disorder:Yes  Duration of Psychotic Symptoms:Greater than six months  Hallucinations:No data recorded Ideas of Reference:-- (No evidence of delusions noted or reported.)  Suicidal Thoughts:No data recorded Homicidal Thoughts:No data recorded  Sensorium  Memory: Immediate Fair  Judgment: Impaired  Insight: Lacking   Executive Functions  Concentration: Poor  Attention Span: Fair  Recall: [place] of Knowledge: Poor  Language: Fair   Psychomotor Activity  Psychomotor Activity:No data recorded  Assets  Assets: Financial Resources/Insurance; Housing; Social Support; Transportation   Sleep  Sleep:No data recorded   Physical Exam: Physical Exam Vitals and nursing note reviewed.  Constitutional:      Appearance: Normal appearance.  HENT:     Head: Normocephalic and atraumatic.     Mouth/Throat:     Pharynx: Oropharynx is clear.  Eyes:     Pupils: Pupils are equal, round, and reactive to light.  Cardiovascular:     Rate and Rhythm: Normal rate and regular rhythm.  Pulmonary:     Effort: Pulmonary effort is normal.     Breath sounds: Normal breath sounds.  Abdominal:     General: Abdomen is flat.     Palpations: Abdomen is soft.  Musculoskeletal:        General: Normal range of motion.  Skin:    General: Skin is warm and dry.  Neurological:     General: No focal deficit present.      Mental Status: He is alert. Mental status is at baseline.  Psychiatric:        Attention and Perception: Attention normal.        Mood and Affect: Mood normal.        Speech: Speech normal.        Behavior: Behavior normal.        Thought Content: Thought content normal.        Cognition and Memory: Cognition normal.        Judgment: Judgment normal.    Review of Systems  Constitutional: Negative.   HENT: Negative.    Eyes: Negative.   Respiratory: Negative.    Cardiovascular: Negative.   Gastrointestinal: Negative.   Musculoskeletal: Negative.   Skin: Negative.   Neurological: Negative.   Psychiatric/Behavioral: Negative.     Blood pressure 106/62, pulse 81, temperature 98.7 F (37.1 C), temperature source Oral, resp. rate 18, height 5\' 11"  (1.803 m), weight 64 kg, SpO2 100 %. Body mass index is 19.68 kg/m.   Social History   Tobacco Use  Smoking Status Every Day   Packs/day: 1.00   Types:  Cigarettes  Smokeless Tobacco Never   Tobacco Cessation:  A prescription for an FDA-approved tobacco cessation medication provided at discharge   Blood Alcohol level:  Lab Results  Component Value Date   [facility] <10 06/11/2022   ETH <10 16/02/9603    Metabolic Disorder Labs:  Lab Results  Component Value Date   HGBA1C 5.1 11/02/2021   MPG 99.67 11/02/2021   Lab Results  Component Value Date   PROLACTIN 22.8 (H) 05/08/2019   Lab Results  Component Value Date   CHOL 141 11/02/2021   TRIG 93 11/02/2021   HDL 69 11/02/2021   CHOLHDL 2.0 11/02/2021   VLDL 19 11/02/2021   LDLCALC 53 11/02/2021   LDLCALC 56 05/08/2019    See Psychiatric Specialty Exam and Suicide Risk Assessment completed by Attending Physician prior to discharge.  Discharge destination:  Home  Is patient on multiple antipsychotic therapies at discharge:  No   Has Patient had three or more failed trials of antipsychotic monotherapy by history:  No  Recommended Plan for Multiple Antipsychotic  Therapies: NA  Discharge Instructions     Diet - low sodium heart healthy   Complete by: As directed    Increase activity slowly   Complete by: As directed       Allergies as of 06/18/2022   No Known Allergies      Medication List     STOP taking these medications    risperiDONE 2 MG disintegrating tablet Commonly known as: RISPERDAL M-TABS Replaced by: risperiDONE 2 MG tablet       TAKE these medications      Indication  benztropine 1 MG tablet Commonly known as: COGENTIN Take 1 tablet (1 mg total) by mouth at bedtime. What changed:  medication strength how much to take  Indication: Extrapyramidal Reaction caused by Medications   hydrOXYzine 50 MG tablet Commonly known as: ATARAX Take 1 tablet (50 mg total) by mouth 3 (three) times daily as needed for anxiety (agitation).  Indication: Feeling Anxious   nicotine polacrilex 2 MG gum Commonly known as: NICORETTE Chew 1 each (2 mg total) by mouth as needed for smoking cessation.  Indication: Nicotine Addiction   risperiDONE 2 MG tablet Commonly known as: RISPERDAL Take 2 tablets (4 mg total) by mouth at bedtime. Replaces: risperiDONE 2 MG disintegrating tablet  Indication: Schizophrenia   traZODone 50 MG tablet Commonly known as: DESYREL Take 1 tablet (50 mg total) by mouth at bedtime as needed for sleep.  Indication: [place] Follow up.   Why: You are scheduled to meet with Lanae Boast, peer support specialist, on Wednesday, 06/20/22 at 7:30AM. Thanks! Contact information: [place] [address] [phone]                 Follow-up recommendations:  Other:  Recommend follow-up with [facility].  Prescriptions of medicines and supply of medicine given at discharge.  Psychoeducation and supportive counseling done.  Comments: See above  Signed: Alethia Berthold, MD 06/18/2022, 9:50 AM

## 2022-06-21 ENCOUNTER — Emergency Department (EMERGENCY_DEPARTMENT_HOSPITAL)
Admission: EM | Admit: 2022-06-21 | Discharge: 2022-06-22 | Disposition: A | Discharge status: Other Acute Inpatient Hospital | Payer: No Typology Code available for payment source | Source: Home / Self Care | Attending: Emergency Medicine | Admitting: Emergency Medicine

## 2022-06-21 DIAGNOSIS — R45851 Suicidal ideations: Secondary | ICD-10-CM | POA: Insufficient documentation

## 2022-06-21 DIAGNOSIS — Z20822 Contact with and (suspected) exposure to covid-19: Secondary | ICD-10-CM | POA: Insufficient documentation

## 2022-06-21 DIAGNOSIS — F23 Brief psychotic disorder: Secondary | ICD-10-CM | POA: Diagnosis present

## 2022-06-21 DIAGNOSIS — F29 Unspecified psychosis not due to a substance or known physiological condition: Secondary | ICD-10-CM | POA: Insufficient documentation

## 2022-06-21 DIAGNOSIS — R443 Hallucinations, unspecified: Secondary | ICD-10-CM

## 2022-06-21 LAB — URINE DRUG SCREEN, QUALITATIVE (ARMC ONLY)
Amphetamines, Ur Screen: POSITIVE — AB
Barbiturates, Ur Screen: NOT DETECTED
Benzodiazepine, Ur Scrn: NOT DETECTED
Cannabinoid 50 Ng, Ur ~~LOC~~: NOT DETECTED
Cocaine Metabolite,Ur ~~LOC~~: NOT DETECTED
MDMA (Ecstasy)Ur Screen: NOT DETECTED
Methadone Scn, Ur: NOT DETECTED
Opiate, Ur Screen: NOT DETECTED
Phencyclidine (PCP) Ur S: NOT DETECTED
Tricyclic, Ur Screen: NOT DETECTED

## 2022-06-21 LAB — CBC
HCT: 45.5 % (ref 39.0–52.0)
Hemoglobin: 14.7 g/dL (ref 13.0–17.0)
MCH: 29.8 pg (ref 26.0–34.0)
MCHC: 32.3 g/dL (ref 30.0–36.0)
MCV: 92.1 fL (ref 80.0–100.0)
Platelets: 298 10*3/uL (ref 150–400)
RBC: 4.94 MIL/uL (ref 4.22–5.81)
RDW: 13.2 % (ref 11.5–15.5)
WBC: 10.2 10*3/uL (ref 4.0–10.5)
nRBC: 0 % (ref 0.0–0.2)

## 2022-06-21 LAB — SALICYLATE LEVEL: Salicylate Lvl: <7 mg/dL — ABNORMAL LOW (ref 7.0–30.0)

## 2022-06-21 LAB — RESP PANEL BY RT-PCR (RSV, FLU A&B, COVID)  RVPGX2
Influenza A by PCR: NEGATIVE
Influenza B by PCR: NEGATIVE
Resp Syncytial Virus by PCR: NEGATIVE
SARS Coronavirus 2 by RT PCR: NEGATIVE

## 2022-06-21 LAB — COMPREHENSIVE METABOLIC PANEL
ALT: 33 U/L (ref 0–44)
AST: 33 U/L (ref 15–41)
Albumin: 4.5 g/dL (ref 3.5–5.0)
Alkaline Phosphatase: 63 U/L (ref 38–126)
Anion gap: 10 (ref 5–15)
BUN: 20 mg/dL (ref 6–20)
CO2: 26 mmol/L (ref 22–32)
Calcium: 9.7 mg/dL (ref 8.9–10.3)
Chloride: 101 mmol/L (ref 98–111)
Creatinine, Ser: 1 mg/dL (ref 0.61–1.24)
GFR, Estimated: >60 mL/min (ref >60)
Glucose, Bld: 65 mg/dL — ABNORMAL LOW (ref 70–99)
Potassium: 4 mmol/L (ref 3.5–5.1)
Sodium: 137 mmol/L (ref 135–145)
Total Bilirubin: 1.3 mg/dL — ABNORMAL HIGH (ref 0.3–1.2)
Total Protein: 8.5 g/dL — ABNORMAL HIGH (ref 6.5–8.1)

## 2022-06-21 LAB — ETHANOL: Alcohol, Ethyl (B): <10 mg/dL (ref <10)

## 2022-06-21 LAB — ACETAMINOPHEN LEVEL: Acetaminophen (Tylenol), Serum: <10 ug/mL — ABNORMAL LOW (ref 10–30)

## 2022-06-21 MED ORDER — NICOTINE POLACRILEX 2 MG MT GUM: 2.0000 mg | CHEWING_GUM | OROMUCOSAL | Status: DC | PRN

## 2022-06-21 MED ORDER — TRAZODONE HCL 100 MG PO TABS
50.0000 mg | ORAL_TABLET | Freq: Every evening | ORAL | Status: DC | PRN
  Administered 2022-06-21: 50 mg via ORAL
  Filled 2022-06-21: qty 1

## 2022-06-21 MED ORDER — HYDROXYZINE HCL 25 MG PO TABS
50.0000 mg | ORAL_TABLET | Freq: Three times a day (TID) | ORAL | Status: DC | PRN
  Administered 2022-06-21: 50 mg via ORAL
  Filled 2022-06-21: qty 2

## 2022-06-21 MED ORDER — RISPERIDONE 3 MG PO TABS
4.0000 mg | ORAL_TABLET | Freq: Every day | ORAL | Status: DC
  Administered 2022-06-21: 4 mg via ORAL
  Filled 2022-06-21: qty 1

## 2022-06-21 MED ORDER — BENZTROPINE MESYLATE 1 MG PO TABS
1.0000 mg | ORAL_TABLET | Freq: Every day | ORAL | Status: DC
  Administered 2022-06-21: 1 mg via ORAL
  Filled 2022-06-21: qty 1

## 2022-06-21 NOTE — ED Notes (Signed)
Dinner was provided. 

## 2022-06-21 NOTE — ED Provider Notes (Signed)
[facility] Provider Note   None    (approximate) History  Psychiatric Evaluation  HPI Ronald [name] is a 27 y.o. male with a past medical history of schizophrenia and methamphetamine abuse who presents under involuntary commitment via law enforcement after he was discharged 3 days ago and has reportedly been back to hallucinations and using methamphetamines per IVC.  Patient states that he has been taking all of his medications on time and as prescribed and denies any illicit drug use.  Patient refusing to respond to any further questions stating "every time I talk you people take me the fuck back in here" ROS: Unable to assess   Physical Exam  Triage Vital Signs: ED Triage Vitals  Enc Vitals Group     BP 06/21/22 1246 127/88     Pulse Rate 06/21/22 1246 97     Resp 06/21/22 1246 18     Temp 06/21/22 1246 98.7 F (37.1 C)     Temp Source 06/21/22 1246 Oral     SpO2 06/21/22 1246 100 %     Weight 06/21/22 1247 154 lb 5.2 oz (70 kg)     Height --      Head Circumference --      Peak Flow --      Pain Score 06/21/22 1247 0     Pain Loc --      Pain Edu? --      Excl. in [place]? --    Most recent vital signs: Vitals:   06/21/22 1246  BP: 127/88  Pulse: 97  Resp: 18  Temp: 98.7 F (37.1 C)  SpO2: 100%   General: Awake, uncooperative CV:  Good peripheral perfusion.  Resp:  Normal effort.  Abd:  No distention.  Other:  Young adult African-American male sitting in chair agitated ED Results / Procedures / Treatments  Labs (all labs ordered are listed, but only abnormal results are displayed) Labs Reviewed  COMPREHENSIVE METABOLIC PANEL  ETHANOL  SALICYLATE LEVEL  ACETAMINOPHEN LEVEL  CBC  URINE DRUG SCREEN, QUALITATIVE ([place])   PROCEDURES: Critical Care performed: No Procedures MEDICATIONS ORDERED IN ED: Medications - No data to display IMPRESSION / MDM / [place] / ED COURSE  I reviewed the triage vital signs and the  nursing notes.                             Patient's presentation is most consistent with acute presentation with potential threat to life or bodily function. Patient presents under IVC for hallucinations/delusions. Thoughts are disorganized. No history of prior suicide attempt, and no SI or HI at this time. Clinically w/ no overt toxidrome, low suspicion for ingestion given hx and exam Thoughts unlikely 2/2 anemia, hypothyroidism, infection, or ICH. Patients decision making capacity is compromised and they are unable to perform all ADLs (additionally they are without appropriate caretakers to assist through this deficit).  Consult: Psychiatry to evaluate patient for grave disability Disposition: Pending psychiatric evaluation  Care of this patient will be signed out the oncoming physician.  All pertinent patient formation is conveyed and all questions answered.  All further care and disposition decisions will be made by the oncoming physician.   FINAL CLINICAL IMPRESSION(S) / ED DIAGNOSES   Final diagnoses:  Hallucinations   Rx / DC Orders   ED Discharge Orders     None      Note:  This document was prepared using  Dragon Armed forces training and education officer and may include unintentional dictation errors.   Naaman Plummer, MD 06/21/22 1256

## 2022-06-21 NOTE — ED Notes (Signed)
This tech obtained vitals on pt.

## 2022-06-21 NOTE — Consult Note (Signed)
[facility] Consult   Reason for Consult:  psychosis Referring Physician:  [place] Patient Identification: Ronald [name] MRN:  [id number] Principal Diagnosis: Acute psychosis ([place]) Diagnosis:  Principal Problem:   Acute psychosis ([place])   Total Time spent with patient: 20 minutes  Subjective:  "I got  little schizophrenia." Ronald [name] is a 27 y.o. male patient admitted with auditory hallucinations.   HPI: Patient presents with the [facility] with IVC papers.  Patient was found sitting out in the rain.  Mother states he has been using meth and not taking his medications.  On evaluation, patient is in nearly constant movement, "tweaking."  His expression of thought is disorganized.  Patient makes phrases such as "peace and quiet" he reports he has used crack in the past, maybe recently.  He appears to be responding to internal stimuli, laughing inappropriately and looking sideways away from the examiners.  Patient states that he cannot sleep.  Patient does not answer other evaluation questions regarding his suicidal thoughts.  He states he has "a lot going on." "No body is patient>"   Past Psychiatric History: schizophrenia, multiple hospitalizations; substance use  Risk to Self:   Risk to Others:   Prior Inpatient Therapy:   Prior Outpatient Therapy:    Past Medical History:  Past Medical History:  Diagnosis Date   Asthma    No past surgical history on file. Family History: No family history on file. Family Psychiatric  History:   Social History:  Social History   Substance and Sexual Activity  Alcohol Use Yes   Alcohol/week: 2.0 standard drinks of alcohol   Types: 2 Cans of beer per week   Comment: every other week     Social History   Substance and Sexual Activity  Drug Use Yes   Types: Marijuana   Comment: weekly    Social History   Socioeconomic History   Marital status: Single    Spouse name: Not on file   Number of  children: Not on file   Years of education: Not on file   Highest education level: Not on file  Occupational History   Not on file  Tobacco Use   Smoking status: Every Day    Packs/day: 1.00    Types: Cigarettes   Smokeless tobacco: Never  Vaping Use   Vaping Use: Never used  Substance and Sexual Activity   Alcohol use: Yes    Alcohol/week: 2.0 standard drinks of alcohol    Types: 2 Cans of beer per week    Comment: every other week   Drug use: Yes    Types: Marijuana    Comment: weekly   Sexual activity: Yes    Partners: Female    Birth control/protection: Condom  Other Topics Concern   Not on file  Social History Narrative   ** Merged History Encounter **       Social Determinants of Health   Financial Resource Strain: Not on file  Food Insecurity: Food Insecurity Present (06/18/2022)   Hunger Vital Sign    Worried About Running Out of Food in the Last Year: Often true    Ran Out of Food in the Last Year: Often true  Transportation Needs: No Transportation Needs (06/18/2022)   PRAPARE - Hydrologist (Medical): No    Lack of Transportation (Non-Medical): No  Physical Activity: Not on file  Stress: Not on file  Social Connections: Not on file   Additional Social History:  Allergies:  No Known Allergies  Labs:  Results for orders placed or performed during the hospital encounter of 06/21/22 (from the past 48 hour(s))  Comprehensive metabolic panel     Status: Abnormal   Collection Time: 06/21/22 12:51 PM  Result Value Ref Range   Sodium 137 135 - 145 mmol/L   Potassium 4.0 3.5 - 5.1 mmol/L   Chloride 101 98 - 111 mmol/L   CO2 26 22 - 32 mmol/L   Glucose, Bld 65 (L) 70 - 99 mg/dL    Comment: Glucose reference range applies only to samples taken after fasting for at least 8 hours.   BUN 20 6 - 20 mg/dL   Creatinine, Ser 5.78 0.61 - 1.24 mg/dL   Calcium 9.7 8.9 - 46.9 mg/dL   Total Protein 8.5 (H) 6.5 - 8.1 g/dL   Albumin 4.5 3.5  - 5.0 g/dL   AST 33 15 - 41 U/L   ALT 33 0 - 44 U/L   Alkaline Phosphatase 63 38 - 126 U/L   Total Bilirubin 1.3 (H) 0.3 - 1.2 mg/dL   GFR, Estimated >62 >95 mL/min    Comment: (NOTE) Calculated using the CKD-EPI Creatinine Equation (2021)    Anion gap 10 5 - 15    Comment: Performed at [facility], [address] [place], [place] [address]  Ethanol     Status: None   Collection Time: 06/21/22 12:51 PM  Result Value Ref Range   Alcohol, Ethyl (B) <10 <10 mg/dL    Comment: (NOTE) Lowest detectable limit for serum alcohol is 10 mg/dL.  For medical purposes only. Performed at [facility], [address] [place], [place] [address]   Salicylate level     Status: Abnormal   Collection Time: 06/21/22 12:51 PM  Result Value Ref Range   Salicylate Lvl <7.0 (L) 7.0 - 30.0 mg/dL    Comment: Performed at [facility], [address] [place], [place] [address]  Acetaminophen level     Status: Abnormal   Collection Time: 06/21/22 12:51 PM  Result Value Ref Range   Acetaminophen (Tylenol), Serum <10 (L) 10 - 30 ug/mL    Comment: (NOTE) Therapeutic concentrations vary significantly. A range of 10-30 ug/mL  may be an effective concentration for many patients. However, some  are best treated at concentrations outside of this range. Acetaminophen concentrations >150 ug/mL at 4 hours after ingestion  and >50 ug/mL at 12 hours after ingestion are often associated with  toxic reactions.  Performed at [facility], [address] [place], [place] [address]   cbc     Status: None   Collection Time: 06/21/22 12:51 PM  Result Value Ref Range   WBC 10.2 4.0 - 10.5 K/uL   RBC 4.94 4.22 - 5.81 MIL/uL   Hemoglobin 14.7 13.0 - 17.0 g/dL   HCT 03.4 74.2 - 59.5 %   MCV 92.1 80.0 - 100.0 fL   MCH 29.8 26.0 - 34.0 pg   MCHC 32.3 30.0 - 36.0 g/dL   RDW 63.8 75.6 - 43.3 %   Platelets 298 150 - 400 K/uL   nRBC 0.0 0.0 - 0.2 %    Comment: Performed at  [facility], [address] [place], [place] [address]    No current facility-administered medications for this encounter.   Current Outpatient Medications  Medication Sig Dispense Refill   benztropine (COGENTIN) 1 MG tablet Take 1 tablet (1 mg total) by mouth at bedtime. 30 tablet  0   hydrOXYzine (ATARAX) 50 MG tablet Take 1 tablet (50 mg total) by mouth 3 (three) times daily as needed for anxiety (agitation). 60 tablet 0   nicotine polacrilex (NICORETTE) 2 MG gum Chew 1 each (2 mg total) by mouth as needed for smoking cessation. 110 tablet 0   risperiDONE (RISPERDAL) 2 MG tablet Take 2 tablets (4 mg total) by mouth at bedtime. 60 tablet 0   traZODone (DESYREL) 50 MG tablet Take 1 tablet (50 mg total) by mouth at bedtime as needed for sleep. 30 tablet 0    Musculoskeletal: Strength & Muscle Tone: within normal limits Gait & Station:  increased movements Patient leans: N/A  Psychiatric Specialty Exam:  Presentation  General Appearance:  Bizarre  Eye Contact: Fleeting  Speech: Garbled  Speech Volume: Normal  Handedness: Right   Mood and Affect  Mood: Anxious (clearly under influence of a substance)  Affect: Inappropriate   Thought Process  Thought Processes: Disorganized  Descriptions of Associations:Loose  Orientation:Full (Time, Place and Person)  Thought Content:Scattered; Illogical  History of Schizophrenia/Schizoaffective disorder:Yes  Duration of Psychotic Symptoms:Greater than six months  Hallucinations:Hallucinations: Visual Description of Visual Hallucinations: appears to be repsonidng to internal stimuli  Ideas of Reference:Delusions ("my parents are dead")  Suicidal Thoughts:Suicidal Thoughts: -- (does not respond to question)  Homicidal Thoughts:Homicidal Thoughts: -- (does not repsond to question)   Sensorium  Memory: Recent Poor  Judgment: Impaired  Insight: Lacking   Executive Functions   Concentration: Poor  Attention Span: Poor  Recall: [place] of Knowledge: Poor  Language: Poor   Psychomotor Activity  Psychomotor Activity:Psychomotor Activity: Increased; Restlessness; Mannerisms   Assets  Assets: Physical Health; Resilience; Social Support; Housing   Sleep  Sleep:Sleep: Poor   Physical Exam: Physical Exam Vitals and nursing note reviewed.  HENT:     Head: Normocephalic.  Cardiovascular:     Rate and Rhythm: Normal rate.  Pulmonary:     Effort: Pulmonary effort is normal.  Musculoskeletal:        General: Normal range of motion.     Cervical back: Normal range of motion.  Skin:    General: Skin is dry.  Neurological:     Mental Status: He is alert.    Review of Systems  Respiratory: Negative.    Psychiatric/Behavioral:  Positive for substance abuse.        History of schizophrenia and substance abuse   Blood pressure 127/88, pulse 97, temperature 98.7 F (37.1 C), temperature source Oral, resp. rate 18, weight 70 kg, SpO2 100 %. Body mass index is 21.52 kg/m.  Treatment Plan Summary: Daily contact with patient to assess and evaluate symptoms and progress in treatment, Medication management, and Plan Re-start medications and admit to inpatient psychiatry . Reviewed with Dr. Cheri Fowler  Disposition: Recommend psychiatric Inpatient admission when medically cleared.  Sherlon Handing, NP 06/21/2022 2:29 PM

## 2022-06-21 NOTE — ED Notes (Signed)
Pt given a [place] sandwich tray. Ate 100% of meal.

## 2022-06-21 NOTE — ED Notes (Signed)
Pt dressed out into [facility] scrubs with this tech, tracey EDT, nursing student, brandon RN and male PD officer present.   Belongings include: White shirt Black belt Blue jeans Blue sweatpants 2 white sandals Loose change  PD had [place] envelope with pts socks and $2  All belongings bagged into a pt belongings back and labeled.

## 2022-06-21 NOTE — ED Triage Notes (Signed)
Pt arrived with [place] [employer]. Pt arrived with IVC papers. Per IVC papers pt has been having auditory hallucinations and sitting out in the rain. Pt mother sts that she believes he is using meth as pt does not take his meds when he does use meth.

## 2022-06-21 NOTE — ED Notes (Signed)
IVC  CONSULT  DONE  PENDING  PLACEMENT 

## 2022-06-21 NOTE — BH Assessment (Signed)
Comprehensive Clinical Assessment (CCA) Note  06/21/2022 Ronald [name] [id number]  Chief Complaint:  Chief Complaint  Patient presents with   Psychiatric Evaluation   Visit Diagnosis: Acute Psychosis   Ronald [name] is a 27 year old male who presents to the ER due to his mother having concerns about his current state of his mental health. Per the mother, the patient abuses drugs when he isn't taking his medications. Thus, she believes he is using methamphetamine and cocaine. Patient admits to the use of cocaine but UDS was negative for it and positive for amphetamines. During the interview, the patient was responding to internal stimuli. Majority of his answers were not appropriate to the questions. For example, he answered "peace and patience" is his current regiment of medications. He denies SI/HI and AV/H. However, it was very noticeable to he was having AV/H. He would laugh at inappropriate times and turn his head and mumble above a whisper.  CCA Screening, Triage and Referral (STR)  Patient Reported Information How did you hear about [place]? Family/Friend  What Is the Reason for Your Visit/Call Today? Patient brought in by [employer], due to not taking his medications and abusing drugs. Responding to internal stimuli.  How Long Has This Been Causing You Problems? > than 6 months  What Do You Feel Would Help You the Most Today? Treatment for Depression or other mood problem; Alcohol or Drug Use Treatment   Have You Recently Had Any Thoughts About Hurting Yourself? No  Are You Planning to Commit Suicide/Harm Yourself At This time? No   Flowsheet Row ED from 06/21/2022 in [facility] Emergency Department at [facility] Admission (Discharged) from 06/12/2022 in [place] ED from 06/11/2022 in [facility] Emergency Department at [place] No Risk No Risk No Risk       Have you Recently Had Thoughts About [place]? No  Are You Planning to Harm Someone at This Time? No  Explanation: No data recorded  Have You Used Any Alcohol or Drugs in the Past 24 Hours? Yes  What Did You Use and How Much? Cocaine and methamphetamine   Do You Currently Have a Therapist/Psychiatrist? Yes  Name of Therapist/Psychiatrist:    Have You Been Recently Discharged From Any Office Practice or Programs? No  Explanation of Discharge From Practice/Program: No data recorded    CCA Screening Triage Referral Assessment Type of Contact: Face-to-Face  Telemedicine Service Delivery:   Is this Initial or Reassessment?   Date Telepsych consult ordered in [facility]:    Time Telepsych consult ordered in [facility]:    Location of Assessment: [facility] ED  Provider Location: [facility] ED   Collateral Involvement: Ronald [name] (Mother)   [phone]   Does Patient Have a Court Appointed Legal Guardian? No  Legal Guardian Contact Information: No data recorded Copy of Legal Guardianship Form: No data recorded Legal Guardian Notified of Arrival: No data recorded Legal Guardian Notified of Pending Discharge: No data recorded If Minor and Not Living with Parent(s), Who has Custody? n/a  Is CPS involved or ever been involved? Never  Is APS involved or ever been involved? Never   Patient Determined To Be At Risk for Harm To Self or Others Based on Review of Patient Reported Information or Presenting Complaint? No  Method: No data recorded Availability of Means: No data recorded Intent: No data recorded Notification Required: No data recorded Additional Information for Danger to Others Potential: No data recorded Additional Comments for Danger to  Others Potential: No data recorded Are There Guns or Other Weapons in [place]? No  Types of Guns/Weapons: No data recorded Are These Weapons Safely Secured?                            No  Who Could Verify You Are Able To Have These Secured: No data recorded Do You Have any  Outstanding Charges, Pending Court Dates, Parole/Probation? No data recorded Contacted To Inform of Risk of Harm To Self or Others: Family/Significant Other:   Does Patient Present under Involuntary Commitment? Yes    [place] of Residence: [place]   Patient Currently Receiving the Following Services: Medication Management   Determination of Need: Emergent (2 hours)   Options For Referral: ED Visit; Inpatient Hospitalization   CCA Biopsychosocial Patient Reported Schizophrenia/Schizoaffective Diagnosis in Past: Yes   Strengths: Have support system, stable housing and receives outpatient treatment.   Mental Health Symptoms Depression:   Change in energy/activity   Duration of Depressive symptoms:  Duration of Depressive Symptoms: Less than two weeks   Mania:   Increased Energy; Recklessness; Racing thoughts   Anxiety:    Restlessness   Psychosis:   Grossly disorganized or catatonic behavior; Grossly disorganized speech   Duration of Psychotic symptoms:  Duration of Psychotic Symptoms: Less than six months   Trauma:   N/A   Obsessions:   N/A   Compulsions:   N/A   Inattention:   N/A   Hyperactivity/Impulsivity:   N/A   Oppositional/Defiant Behaviors:   N/A   Emotional Irregularity:   N/A   Other Mood/Personality Symptoms:   n/a    Mental Status Exam Appearance and self-care  Stature:   Average   Weight:   Average weight   Clothing:   Casual   Grooming:   Normal   Cosmetic use:   None   Posture/gait:   Tense   Motor activity:   -- (within normal range)   Sensorium  Attention:   Normal   Concentration:   Normal   Orientation:   X5   Recall/memory:   Normal   Affect and Mood  Affect:   Constricted; Labile; Not Congruent   Mood:   Anxious   Relating  Eye contact:   Avoided; Fleeting   Facial expression:   Tense   Attitude toward examiner:   Cooperative   Thought and Language  Speech flow:   Articulation error; Flight of Ideas; Pressured   Thought content:   Appropriate to Mood and Circumstances   Preoccupation:   Other (Comment)   Hallucinations:   Auditory   Organization:   Disorganized   Transport planner of Knowledge:   Fair   Intelligence:   Average   Abstraction:   Normal; Overly abstract   Judgement:   Impaired   Reality Testing:   Distorted   Insight:   Poor   Decision Making:   Impulsive   Social Functioning  Social Maturity:   Irresponsible; Isolates   Social Judgement:   Heedless; "Street Smart"   Stress  Stressors:   Other (Comment)   Coping Ability:   Exhausted   Skill Deficits:   Responsibility; Self-care   Supports:   Family     Religion: Religion/Spirituality Are You A Religious Person?: No  Leisure/Recreation: Leisure / Recreation Do You Have Hobbies?: No  Exercise/Diet: Exercise/Diet Do You Exercise?: No Have You Gained or Lost A Significant Amount of Weight in the Past  Six Months?: No Do You Follow a Special Diet?: No Do You Have Any Trouble Sleeping?: Yes Explanation of Sleeping Difficulties: States he has trouble falling asleep.   CCA Employment/Education Employment/Work Situation: Employment / Work Situation Employment Situation: Unemployed Patient's Job has Been Impacted by Current Illness: No Has Patient ever Been in Equities trader?: No  Education: Education Is Patient Currently Attending School?: No Did You Product manager?: No Did You Have An Individualized Education Program (IIEP): No Did You Have Any Difficulty At [employer]?: No Patient's Education Has Been Impacted by Current Illness: No   CCA Family/Childhood History Family and Relationship History: Family history Marital status: Single Does patient have children?: No  Childhood History:  Childhood History By whom was/is the patient raised?: Both parents Did patient suffer any verbal/emotional/physical/sexual abuse as a  child?: No Did patient suffer from severe childhood neglect?: No Has patient ever been sexually abused/assaulted/raped as an adolescent or adult?: No Was the patient ever a victim of a crime or a disaster?: No Witnessed domestic violence?: No Has patient been affected by domestic violence as an adult?: No   CCA Substance Use Alcohol/Drug Use: Alcohol / Drug Use Pain Medications: See PTA Prescriptions: See PTA Over the Counter: See PTA History of alcohol / drug use?: Yes Substance #1 Name of Substance 1: Cocaine Substance #2 Name of Substance 2: Methamphetamine Substance #3 Name of Substance 3: Alcohol   ASAM's:  Six Dimensions of Multidimensional Assessment  Dimension 1:  Acute Intoxication and/or Withdrawal Potential:      Dimension 2:  Biomedical Conditions and Complications:      Dimension 3:  Emotional, Behavioral, or Cognitive Conditions and Complications:     Dimension 4:  Readiness to Change:     Dimension 5:  Relapse, Continued use, or Continued Problem Potential:     Dimension 6:  Recovery/Living Environment:     ASAM Severity Score:    ASAM Recommended Level of Treatment:     Substance use Disorder (SUD)    Recommendations for Services/Supports/Treatments:    Discharge Disposition:    DSM5 Diagnoses: Patient Active Problem List   Diagnosis Date Noted   Schizophrenia (HCC) 11/02/2021   Psychosis (HCC) 11/01/2021   Abnormal behavior    Acute psychosis (HCC) 10/02/2021   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

## 2022-06-22 ENCOUNTER — Other Ambulatory Visit: Payer: Self-pay

## 2022-06-22 ENCOUNTER — Inpatient Hospital Stay
Admission: AD | Admit: 2022-06-22 | Discharge: 2022-06-25 | DRG: 897 | Disposition: A | Discharge status: Home / Self Care | Payer: No Typology Code available for payment source | Source: Intra-hospital | Attending: Psychiatry | Admitting: Psychiatry

## 2022-06-22 ENCOUNTER — Encounter: Payer: Self-pay | Admitting: Psychiatry

## 2022-06-22 DIAGNOSIS — F23 Brief psychotic disorder: Secondary | ICD-10-CM | POA: Diagnosis not present

## 2022-06-22 DIAGNOSIS — F17210 Nicotine dependence, cigarettes, uncomplicated: Secondary | ICD-10-CM | POA: Diagnosis present

## 2022-06-22 DIAGNOSIS — F209 Schizophrenia, unspecified: Principal | ICD-10-CM | POA: Diagnosis present

## 2022-06-22 DIAGNOSIS — F200 Paranoid schizophrenia: Secondary | ICD-10-CM | POA: Diagnosis not present

## 2022-06-22 DIAGNOSIS — Z1152 Encounter for screening for COVID-19: Secondary | ICD-10-CM | POA: Diagnosis not present

## 2022-06-22 DIAGNOSIS — J45909 Unspecified asthma, uncomplicated: Secondary | ICD-10-CM | POA: Diagnosis present

## 2022-06-22 DIAGNOSIS — Z79899 Other long term (current) drug therapy: Secondary | ICD-10-CM | POA: Diagnosis not present

## 2022-06-22 DIAGNOSIS — F15959 Other stimulant use, unspecified with stimulant-induced psychotic disorder, unspecified: Secondary | ICD-10-CM | POA: Diagnosis present

## 2022-06-22 DIAGNOSIS — F15159 Other stimulant abuse with stimulant-induced psychotic disorder, unspecified: Secondary | ICD-10-CM | POA: Diagnosis present

## 2022-06-22 MED ORDER — ACETAMINOPHEN 325 MG PO TABS: 650.0000 mg | ORAL_TABLET | Freq: Four times a day (QID) | ORAL | Status: DC | PRN

## 2022-06-22 MED ORDER — TRAZODONE HCL 50 MG PO TABS
50.0000 mg | ORAL_TABLET | Freq: Every day | ORAL | Status: DC
  Administered 2022-06-22 – 2022-06-24 (×3): 50 mg via ORAL
  Filled 2022-06-22 (×3): qty 1

## 2022-06-22 MED ORDER — MAGNESIUM HYDROXIDE 400 MG/5ML PO SUSP: 30.0000 mL | Freq: Every day | ORAL | Status: DC | PRN

## 2022-06-22 MED ORDER — NICOTINE POLACRILEX 2 MG MT GUM: 2.0000 mg | CHEWING_GUM | OROMUCOSAL | Status: DC | PRN

## 2022-06-22 MED ORDER — RISPERIDONE 1 MG PO TBDP
4.0000 mg | ORAL_TABLET | Freq: Every day | ORAL | Status: DC
  Administered 2022-06-22 – 2022-06-24 (×3): 4 mg via ORAL
  Filled 2022-06-22 (×4): qty 4

## 2022-06-22 MED ORDER — BENZTROPINE MESYLATE 1 MG PO TABS
1.0000 mg | ORAL_TABLET | Freq: Every day | ORAL | Status: DC
  Administered 2022-06-22 – 2022-06-24 (×3): 1 mg via ORAL
  Filled 2022-06-22 (×3): qty 1

## 2022-06-22 MED ORDER — ALUM & MAG HYDROXIDE-SIMETH 200-200-20 MG/5ML PO SUSP: 30.0000 mL | ORAL | Status: DC | PRN

## 2022-06-22 NOTE — Tx Team (Signed)
Initial Treatment Plan 06/22/2022 1:29 AM [name] [id number]    PATIENT STRESSORS: Marital or family conflict   Medication change or noncompliance   Substance abuse     PATIENT STRENGTHS: Supportive family/friends    PATIENT IDENTIFIED PROBLEMS: Medication non compliance  Substance abuse                   DISCHARGE CRITERIA:  Improved stabilization in mood, thinking, and/or behavior Motivation to continue treatment in a less acute level of care Verbal commitment to aftercare and medication compliance  PRELIMINARY DISCHARGE PLAN: Outpatient therapy Return to previous work or school arrangements  PATIENT/FAMILY INVOLVEMENT: This treatment plan has been presented to and reviewed with the patient, Ronald [name].  The patient and family have been given the opportunity to ask questions and make suggestions.  Wylie Hail, RN 06/22/2022, 1:29 AM

## 2022-06-22 NOTE — Progress Notes (Signed)
Patient is A+O x 4. He denies SI/HI/AVH. He denies anxiety and depression. Patient is familiar with the milieu as he was he was a patient on this unit less than a week ago.  Patient prefers to isolate to his room but will present for meals and to briefly communicate with other patients. No meds prescribed on day shift. No PRN's requested.  Q15 minute unit checks in place.

## 2022-06-22 NOTE — BHH Suicide Risk Assessment (Signed)
[place] INPATIENT:  Family/Significant Other Suicide Prevention Education  Suicide Prevention Education:  Patient Refusal for Family/Significant Other Suicide Prevention Education: The patient Ronald [name] has refused to provide written consent for family/significant other to be provided Family/Significant Other Suicide Prevention Education during admission and/or prior to discharge.  Physician notified.  Durenda Hurt 06/22/2022, 2:58 PM

## 2022-06-22 NOTE — Plan of Care (Signed)
  Problem: Nutrition: Goal: Adequate nutrition will be maintained Outcome: Progressing   Problem: Pain Managment: Goal: General experience of comfort will improve Outcome: Progressing   Problem: Health Behavior/Discharge Planning: Goal: Ability to manage health-related needs will improve Outcome: Not Progressing

## 2022-06-22 NOTE — H&P (Signed)
Psychiatric Admission Assessment Adult  Patient Identification: Ronald [name] MRN:  [id number] Date of Evaluation:  06/22/2022 Chief Complaint:  Schizophrenia (HCC) [F20.9] Principal Diagnosis: Schizophrenia (HCC) Diagnosis:  Principal Problem:   Schizophrenia (HCC) Active Problems:   Stimulant-induced psychotic disorder (HCC)  History of Present Illness: Patient seen and chart reviewed.  27 year old man with schizophrenia who was just discharged from the hospital at the early part of this week and returned after only a couple of days.  Commitment paper by mother states that he was standing in the rain smoking methamphetamine shaking talking to himself and acting strangely.  On interview today the patient admits he was smoking methamphetamine.  He says he had been compliant with medicine because he had gone to [facility] for his follow-up appointment which is true.  He currently denies hallucinations.  He denies any suicidal or homicidal ideation.  He expresses irritability with his family for having brought him back into the hospital for what he sees as a nonproblem. Associated Signs/Symptoms: Depression Symptoms:  difficulty concentrating, (Hypo) Manic Symptoms:  Distractibility, Impulsivity, Anxiety Symptoms:   None specific Psychotic Symptoms:   Currently denies all PTSD Symptoms: Negative Total Time spent with patient: 45 minutes  Past Psychiatric History: Patient has a history of schizophrenia and substance abuse.  Several prior hospitalizations.  No history of self-harm or violence identified.  Has done reasonably well on Risperdal.  Most recently stated that he intended to follow-up with outpatient treatment and did keep his follow-up appointment.  Is the patient at risk to self? No.  Has the patient been a risk to self in the past 6 months? No.  Has the patient been a risk to self within the distant past? No.  Is the patient a risk to others? No.  Has the patient been a risk to others  in the past 6 months? No.  Has the patient been a risk to others within the distant past? No.   [place] Scale:  Flowsheet Row Admission (Current) from 06/22/2022 in [facility] INPATIENT BEH[facility] MEDICINE ED from 06/21/2022 in [facility] Emergency Department at [facility] Admission (Discharged) from 06/12/2022 in [facility] INPATIENT BEHAVIORAL MEDICINE  C-SSRS RISK CATEGORY No Risk No Risk No Risk        Prior Inpatient Therapy: Yes.   If yes, describe just last week he was here in the hospital with psychosis Prior Outpatient Therapy: Yes.   If yes, describe follow-up at [facility]  Alcohol Screening: 1. How often do you have a drink containing alcohol?: Never 2. How many drinks containing alcohol do you have on a typical day when you are drinking?: 1 or 2 3. How often do you have six or more drinks on one occasion?: Never AUDIT-C Score: 0 4. How often during the last year have you found that you were not able to stop drinking once you had started?: Never 5. How often during the last year have you failed to do what was normally expected from you because of drinking?: Never 6. How often during the last year have you needed a first drink in the morning to get yourself going after a heavy drinking session?: Never 7. How often during the last year have you had a feeling of guilt of remorse after drinking?: Never 8. How often during the last year have you been unable to remember what happened the night before because you had been drinking?: Never 9. Have you or someone else been injured as a result of your drinking?: No 10. Has  a relative or friend or a doctor or another health worker been concerned about your drinking or suggested you cut down?: No Alcohol Use Disorder Identification Test Final Score (AUDIT): 0 Substance Abuse History in the last 12 months:  Yes.   Consequences of Substance Abuse: Amphetamine use a large driver of symptoms and difficulty with his family Previous Psychotropic  Medications: Yes  Psychological Evaluations: Yes  Past Medical History:  Past Medical History:  Diagnosis Date   Asthma    History reviewed. No pertinent surgical history. Family History: History reviewed. No pertinent family history. Family Psychiatric  History: None reported Tobacco Screening:  Social History   Tobacco Use  Smoking Status Every Day   Packs/day: 1.00   Types: Cigarettes  Smokeless Tobacco Never    BH Tobacco Counseling     Are you interested in Tobacco Cessation Medications?  No, patient refused Counseled patient on smoking cessation:  Refused/Declined practical counseling Reason Tobacco Screening Not Completed: Patient Refused Screening       Social History:  Social History   Substance and Sexual Activity  Alcohol Use Yes   Alcohol/week: 2.0 standard drinks of alcohol   Types: 2 Cans of beer per week   Comment: every other week     Social History   Substance and Sexual Activity  Drug Use Yes   Types: Marijuana   Comment: weekly    Additional Social History:                           Allergies:  No Known Allergies Lab Results:  Results for orders placed or performed during the hospital encounter of 06/21/22 (from the past 48 hour(s))  Comprehensive metabolic panel     Status: Abnormal   Collection Time: 06/21/22 12:51 PM  Result Value Ref Range   Sodium 137 135 - 145 mmol/L   Potassium 4.0 3.5 - 5.1 mmol/L   Chloride 101 98 - 111 mmol/L   CO2 26 22 - 32 mmol/L   Glucose, Bld 65 (L) 70 - 99 mg/dL    Comment: Glucose reference range applies only to samples taken after fasting for at least 8 hours.   BUN 20 6 - 20 mg/dL   Creatinine, Ser 1.00 0.61 - 1.24 mg/dL   Calcium 9.7 8.9 - 10.3 mg/dL   Total Protein 8.5 (H) 6.5 - 8.1 g/dL   Albumin 4.5 3.5 - 5.0 g/dL   AST 33 15 - 41 U/L   ALT 33 0 - 44 U/L   Alkaline Phosphatase 63 38 - 126 U/L   Total Bilirubin 1.3 (H) 0.3 - 1.2 mg/dL   GFR, Estimated >60 >60 mL/min    Comment:  (NOTE) Calculated using the CKD-EPI Creatinine Equation (2021)    Anion gap 10 5 - 15    Comment: Performed at [facility], [address] [place], [place] [address]  Ethanol     Status: None   Collection Time: 06/21/22 12:51 PM  Result Value Ref Range   Alcohol, Ethyl (B) <10 <10 mg/dL    Comment: (NOTE) Lowest detectable limit for serum alcohol is 10 mg/dL.  For medical purposes only. Performed at [facility], [address] [place] [address]   Salicylate level     Status: Abnormal   Collection Time: 06/21/22 12:51 PM  Result Value Ref Range   Salicylate Lvl <3.6 (L) 7.0 - 30.0 mg/dL    Comment: Performed at [facility],  [address] [place], [place] [address]  Acetaminophen level     Status: Abnormal   Collection Time: 06/21/22 12:51 PM  Result Value Ref Range   Acetaminophen (Tylenol), Serum <10 (L) 10 - 30 ug/mL    Comment: (NOTE) Therapeutic concentrations vary significantly. A range of 10-30 ug/mL  may be an effective concentration for many patients. However, some  are best treated at concentrations outside of this range. Acetaminophen concentrations >150 ug/mL at 4 hours after ingestion  and >50 ug/mL at 12 hours after ingestion are often associated with  toxic reactions.  Performed at [facility], [address] [place], [place] [address]   cbc     Status: None   Collection Time: 06/21/22 12:51 PM  Result Value Ref Range   WBC 10.2 4.0 - 10.5 K/uL   RBC 4.94 4.22 - 5.81 MIL/uL   Hemoglobin 14.7 13.0 - 17.0 g/dL   HCT 76.7 20.9 - 47.0 %   MCV 92.1 80.0 - 100.0 fL   MCH 29.8 26.0 - 34.0 pg   MCHC 32.3 30.0 - 36.0 g/dL   RDW 96.2 83.6 - 62.9 %   Platelets 298 150 - 400 K/uL   nRBC 0.0 0.0 - 0.2 %    Comment: Performed at [facility], [address], [place], [place] [address]  Urine Drug Screen, Qualitative     Status: Abnormal   Collection Time: 06/21/22 12:52 PM  Result Value Ref Range    Tricyclic, Ur Screen NONE DETECTED NONE DETECTED   Amphetamines, Ur Screen POSITIVE (A) NONE DETECTED   MDMA (Ecstasy)Ur Screen NONE DETECTED NONE DETECTED   Cocaine Metabolite,Ur [place] NONE DETECTED NONE DETECTED   Opiate, Ur Screen NONE DETECTED NONE DETECTED   Phencyclidine (PCP) Ur S NONE DETECTED NONE DETECTED   Cannabinoid 50 Ng, Ur [place] NONE DETECTED NONE DETECTED   Barbiturates, Ur Screen NONE DETECTED NONE DETECTED   Benzodiazepine, Ur Scrn NONE DETECTED NONE DETECTED   Methadone Scn, Ur NONE DETECTED NONE DETECTED    Comment: (NOTE) Tricyclics + metabolites, urine    Cutoff 1000 ng/mL Amphetamines + metabolites, urine  Cutoff 1000 ng/mL MDMA (Ecstasy), urine              Cutoff 500 ng/mL Cocaine Metabolite, urine          Cutoff 300 ng/mL Opiate + metabolites, urine        Cutoff 300 ng/mL Phencyclidine (PCP), urine         Cutoff 25 ng/mL Cannabinoid, urine                 Cutoff 50 ng/mL Barbiturates + metabolites, urine  Cutoff 200 ng/mL Benzodiazepine, urine              Cutoff 200 ng/mL Methadone, urine                   Cutoff 300 ng/mL  The urine drug screen provides only a preliminary, unconfirmed analytical test result and should not be used for non-medical purposes. Clinical consideration and professional judgment should be applied to any positive drug screen result due to possible interfering substances. A more specific alternate chemical method must be used in order to obtain a confirmed analytical result. Gas chromatography / mass spectrometry (GC/MS) is the preferred confirm atory method. Performed at [facility], [address] [place], [place] [address]   Resp panel by RT-PCR (RSV, Flu A&B, Covid) Anterior Nasal Swab     Status:  None   Collection Time: 06/21/22  7:38 PM   Specimen: Anterior Nasal Swab  Result Value Ref Range   SARS Coronavirus 2 by RT PCR NEGATIVE NEGATIVE    Comment: (NOTE) SARS-CoV-2 target nucleic acids are NOT  DETECTED.  The SARS-CoV-2 RNA is generally detectable in upper respiratory specimens during the acute phase of infection. The lowest concentration of SARS-CoV-2 viral copies this assay can detect is 138 copies/mL. A negative result does not preclude SARS-Cov-2 infection and should not be used as the sole basis for treatment or other patient management decisions. A negative result may occur with  improper specimen collection/handling, submission of specimen other than nasopharyngeal swab, presence of viral mutation(s) within the areas targeted by this assay, and inadequate number of viral copies(<138 copies/mL). A negative result must be combined with clinical observations, patient history, and epidemiological information. The expected result is Negative.  Fact Sheet for Patients:  EntrepreneurPulse.com.au  Fact Sheet for Healthcare Providers:  IncredibleEmployment.be  This test is no t yet approved or cleared by the [place] FDA and  has been authorized for detection and/or diagnosis of SARS-CoV-2 by FDA under an Emergency Use Authorization (EUA). This EUA will remain  in effect (meaning this test can be used) for the duration of the COVID-19 declaration under Section 564(b)(1) of the Act, 21 U.S.C.section 360bbb-3(b)(1), unless the authorization is terminated  or revoked sooner.       Influenza A by PCR NEGATIVE NEGATIVE   Influenza B by PCR NEGATIVE NEGATIVE    Comment: (NOTE) The Xpert Xpress SARS-CoV-2/FLU/RSV plus assay is intended as an aid in the diagnosis of influenza from Nasopharyngeal swab specimens and should not be used as a sole basis for treatment. Nasal washings and aspirates are unacceptable for Xpert Xpress SARS-CoV-2/FLU/RSV testing.  Fact Sheet for Patients: EntrepreneurPulse.com.au  Fact Sheet for Healthcare Providers: IncredibleEmployment.be  This test is not yet approved or  cleared by the [place] FDA and has been authorized for detection and/or diagnosis of SARS-CoV-2 by FDA under an Emergency Use Authorization (EUA). This EUA will remain in effect (meaning this test can be used) for the duration of the COVID-19 declaration under Section 564(b)(1) of the Act, 21 U.S.C. section 360bbb-3(b)(1), unless the authorization is terminated or revoked.     Resp Syncytial Virus by PCR NEGATIVE NEGATIVE    Comment: (NOTE) Fact Sheet for Patients: EntrepreneurPulse.com.au  Fact Sheet for Healthcare Providers: IncredibleEmployment.be  This test is not yet approved or cleared by the [place] FDA and has been authorized for detection and/or diagnosis of SARS-CoV-2 by FDA under an Emergency Use Authorization (EUA). This EUA will remain in effect (meaning this test can be used) for the duration of the COVID-19 declaration under Section 564(b)(1) of the Act, 21 U.S.C. section 360bbb-3(b)(1), unless the authorization is terminated or revoked.  Performed at [facility], [address] [place], [place] [address]     Blood Alcohol level:  Lab Results  Component Value Date   [facility] <10 06/21/2022   ETH <10 60/45/4098    Metabolic Disorder Labs:  Lab Results  Component Value Date   HGBA1C 5.1 11/02/2021   MPG 99.67 11/02/2021   Lab Results  Component Value Date   PROLACTIN 22.8 (H) 05/08/2019   Lab Results  Component Value Date   CHOL 141 11/02/2021   TRIG 93 11/02/2021   HDL 69 11/02/2021   CHOLHDL 2.0 11/02/2021   VLDL 19 11/02/2021   LDLCALC 53 11/02/2021   LDLCALC 56  05/08/2019    Current Medications: Current Facility-Administered Medications  Medication Dose Route Frequency Provider Last Rate Last Admin   acetaminophen (TYLENOL) tablet 650 mg  650 mg Oral Q6H PRN Sindy Guadeloupe, NP       alum & mag hydroxide-simeth (MAALOX/MYLANTA) 200-200-20 MG/5ML suspension 30 mL  30 mL Oral Q4H PRN  Sindy Guadeloupe, NP       benztropine (COGENTIN) tablet 1 mg  1 mg Oral QHS Gavin Telford T, MD       magnesium hydroxide (MILK OF MAGNESIA) suspension 30 mL  30 mL Oral Daily PRN Sindy Guadeloupe, NP       nicotine polacrilex (NICORETTE) gum 2 mg  2 mg Oral PRN Ashton Belote, Jackquline Denmark, MD       risperiDONE (RISPERDAL M-TABS) disintegrating tablet 4 mg  4 mg Oral QHS Evangela Heffler, Jackquline Denmark, MD       traZODone (DESYREL) tablet 50 mg  50 mg Oral QHS Genesia Caslin, Jackquline Denmark, MD       PTA Medications: Medications Prior to Admission  Medication Sig Dispense Refill Last Dose   benztropine (COGENTIN) 1 MG tablet Take 1 tablet (1 mg total) by mouth at bedtime. 30 tablet 0    hydrOXYzine (ATARAX) 50 MG tablet Take 1 tablet (50 mg total) by mouth 3 (three) times daily as needed for anxiety (agitation). 60 tablet 0    nicotine polacrilex (NICORETTE) 2 MG gum Chew 1 each (2 mg total) by mouth as needed for smoking cessation. 110 tablet 0    risperiDONE (RISPERDAL) 2 MG tablet Take 2 tablets (4 mg total) by mouth at bedtime. 60 tablet 0    traZODone (DESYREL) 50 MG tablet Take 1 tablet (50 mg total) by mouth at bedtime as needed for sleep. 30 tablet 0     Musculoskeletal: Strength & Muscle Tone: within normal limits Gait & Station: normal Patient leans: N/A            Psychiatric Specialty Exam:  Presentation  General Appearance:  Bizarre  Eye Contact: Fleeting  Speech: Garbled  Speech Volume: Normal  Handedness: Right   Mood and Affect  Mood: Anxious (clearly under influence of a substance)  Affect: Inappropriate   Thought Process  Thought Processes: Disorganized  Duration of Psychotic Symptoms: Chronic.  Years. Past Diagnosis of Schizophrenia or Psychoactive disorder: Yes  Descriptions of Associations:Loose  Orientation:Full (Time, Place and Person)  Thought Content:Scattered; Illogical  Hallucinations:Hallucinations: Visual Description of Visual Hallucinations: appears to be  repsonidng to internal stimuli  Ideas of Reference:Delusions ("my parents are dead")  Suicidal Thoughts:Suicidal Thoughts: -- (does not respond to question)  Homicidal Thoughts:Homicidal Thoughts: -- (does not repsond to question)   Sensorium  Memory: Recent Poor  Judgment: Impaired  Insight: Lacking   Executive Functions  Concentration: Poor  Attention Span: Poor  Recall: Fair  Fund of Knowledge: Poor  Language: Poor   Psychomotor Activity  Psychomotor Activity: Psychomotor Activity: Increased; Restlessness; Mannerisms   Assets  Assets: Physical Health; Resilience; Social Support; Housing   Sleep  Sleep: Sleep: Poor    Physical Exam: Physical Exam Vitals and nursing note reviewed.  Constitutional:      Appearance: Normal appearance.  HENT:     Head: Normocephalic and atraumatic.     Mouth/Throat:     Pharynx: Oropharynx is clear.  Eyes:     Pupils: Pupils are equal, round, and reactive to light.  Cardiovascular:     Rate and Rhythm: Normal rate and regular rhythm.  Pulmonary:  Effort: Pulmonary effort is normal.     Breath sounds: Normal breath sounds.  Abdominal:     General: Abdomen is flat.     Palpations: Abdomen is soft.  Musculoskeletal:        General: Normal range of motion.  Skin:    General: Skin is warm and dry.  Neurological:     General: No focal deficit present.     Mental Status: He is alert. Mental status is at baseline.  Psychiatric:        Attention and Perception: Attention normal.        Mood and Affect: Mood normal. Affect is blunt.        Speech: Speech normal.        Behavior: Behavior is cooperative.        Thought Content: Thought content normal.        Cognition and Memory: Memory is impaired.        Judgment: Judgment is impulsive.    Review of Systems  Constitutional: Negative.   HENT: Negative.    Eyes: Negative.   Respiratory: Negative.    Cardiovascular: Negative.   Gastrointestinal:  Negative.   Musculoskeletal: Negative.   Skin: Negative.   Neurological: Negative.   Psychiatric/Behavioral:  Positive for memory loss and substance abuse. Negative for depression, hallucinations and suicidal ideas. The patient is not nervous/anxious and does not have insomnia.    Blood pressure 130/80, pulse 97, temperature 98.3 F (36.8 C), temperature source Oral, resp. rate 20, height 5\' 11"  (1.803 m), weight 70 kg, SpO2 100 %. Body mass index is 21.52 kg/m.  Treatment Plan Summary: Medication management and Plan continue 15-minute checks.  Restart Risperdal and other medicines consistent with what he was taking last time.  Labs reviewed.  Engage in individual and group therapy.  Patient met with treatment team today.  Give him a day or so to cool off and then probably be ready for discharge again.  Patient told me that he felt his largest problem was being around his family because he felt they did not understand his problems and kept forcing him to come to the hospital.  Not however making any dangerous threats.  Observation Level/Precautions:  15 minute checks  Laboratory:  Chemistry Profile  Psychotherapy:    Medications:    Consultations:    Discharge Concerns:    Estimated LOS:  Other:     Physician Treatment Plan for Primary Diagnosis: Schizophrenia (HCC) Long Term Goal(s): Improvement in symptoms so as ready for discharge  Short Term Goals: Ability to demonstrate self-control will improve  Physician Treatment Plan for Secondary Diagnosis: Principal Problem:   Schizophrenia (HCC) Active Problems:   Stimulant-induced psychotic disorder (HCC)  Long Term Goal(s): Improvement in symptoms so as ready for discharge  Short Term Goals: Compliance with prescribed medications will improve  I certify that inpatient services furnished can reasonably be expected to improve the patient's condition.    Mordecai RasmussenJohn Leeum Sankey, MD 1/26/20241:21 PM

## 2022-06-22 NOTE — BH IP Treatment Plan (Signed)
Interdisciplinary Treatment and Diagnostic Plan Update  06/22/2022 Time of Session: 0830 Ronald [name] MRN: [id number]  Principal Diagnosis: Schizophrenia [facility])  Secondary Diagnoses: Principal Problem:   Schizophrenia ([place])   Current Medications:  Current Facility-Administered Medications  Medication Dose Route Frequency Provider Last Rate Last Admin   acetaminophen (TYLENOL) tablet 650 mg  650 mg Oral Q6H PRN Evette Georges, NP       alum & mag hydroxide-simeth (MAALOX/MYLANTA) 200-200-20 MG/5ML suspension 30 mL  30 mL Oral Q4H PRN Evette Georges, NP       magnesium hydroxide (MILK OF MAGNESIA) suspension 30 mL  30 mL Oral Daily PRN Evette Georges, NP       PTA Medications: Medications Prior to Admission  Medication Sig Dispense Refill Last Dose   benztropine (COGENTIN) 1 MG tablet Take 1 tablet (1 mg total) by mouth at bedtime. 30 tablet 0    hydrOXYzine (ATARAX) 50 MG tablet Take 1 tablet (50 mg total) by mouth 3 (three) times daily as needed for anxiety (agitation). 60 tablet 0    nicotine polacrilex (NICORETTE) 2 MG gum Chew 1 each (2 mg total) by mouth as needed for smoking cessation. 110 tablet 0    risperiDONE (RISPERDAL) 2 MG tablet Take 2 tablets (4 mg total) by mouth at bedtime. 60 tablet 0    traZODone (DESYREL) 50 MG tablet Take 1 tablet (50 mg total) by mouth at bedtime as needed for sleep. 30 tablet 0     Patient Stressors: Marital or family conflict   Medication change or noncompliance   Substance abuse    Patient Strengths: Supportive family/friends   Treatment Modalities: Medication Management, Group therapy, Case management,  1 to 1 session with clinician, Psychoeducation, Recreational therapy.   Physician Treatment Plan for Primary Diagnosis: Schizophrenia ([place]) Long Term Goal(s):     Short Term Goals:    Medication Management: Evaluate patient's response, side effects, and tolerance of medication regimen.  Therapeutic Interventions: 1 to 1 sessions,  Unit Group sessions and Medication administration.  Evaluation of Outcomes: Progressing  Physician Treatment Plan for Secondary Diagnosis: Principal Problem:   Schizophrenia ([place])  Long Term Goal(s):     Short Term Goals:       Medication Management: Evaluate patient's response, side effects, and tolerance of medication regimen.  Therapeutic Interventions: 1 to 1 sessions, Unit Group sessions and Medication administration.  Evaluation of Outcomes: Progressing   RN Treatment Plan for Primary Diagnosis: Schizophrenia ([place]) Long Term Goal(s): Knowledge of disease and therapeutic regimen to maintain health will improve  Short Term Goals: Ability to remain free from injury will improve, Ability to verbalize frustration and anger appropriately will improve, Ability to demonstrate self-control, Ability to participate in decision making will improve, Ability to verbalize feelings will improve, Ability to disclose and discuss suicidal ideas, Ability to identify and develop effective coping behaviors will improve, and Compliance with prescribed medications will improve  Medication Management: RN will administer medications as ordered by provider, will assess and evaluate patient's response and provide education to patient for prescribed medication. RN will report any adverse and/or side effects to prescribing provider.  Therapeutic Interventions: 1 on 1 counseling sessions, Psychoeducation, Medication administration, Evaluate responses to treatment, Monitor vital signs and CBGs as ordered, Perform/monitor CIWA, COWS, AIMS and Fall Risk screenings as ordered, Perform wound care treatments as ordered.  Evaluation of Outcomes: Progressing   LCSW Treatment Plan for Primary Diagnosis: Schizophrenia ([place]) Long Term Goal(s): Safe transition to appropriate next level of care at discharge,  Engage patient in therapeutic group addressing interpersonal concerns.  Short Term Goals: Engage patient in  aftercare planning with referrals and resources, Increase social support, Increase ability to appropriately verbalize feelings, Increase emotional regulation, Facilitate acceptance of mental health diagnosis and concerns, Facilitate patient progression through stages of change regarding substance use diagnoses and concerns, Identify triggers associated with mental health/substance abuse issues, and Increase skills for wellness and recovery  Therapeutic Interventions: Assess for all discharge needs, 1 to 1 time with Social worker, Explore available resources and support systems, Assess for adequacy in community support network, Educate family and significant other(s) on suicide prevention, Complete Psychosocial Assessment, Interpersonal group therapy.  Evaluation of Outcomes: Progressing   Progress in Treatment: Attending groups: No. Participating in groups: No. Taking medication as prescribed: Yes. Toleration medication: Yes. Family/Significant other contact made: No, will contact:  CSW will obtain consent to reach family/friend.  Patient understands diagnosis: No. Discussing patient identified problems/goals with staff: Yes. Medical problems stabilized or resolved: Yes. Denies suicidal/homicidal ideation: Yes. Issues/concerns per patient self-inventory: Yes. Other: none  New problem(s) identified: No, Describe:  none  New Short Term/Long Term Goal(s): Patient to work towards detox, elimination of symptoms of psychosis, medication management for mood stabilization; development of comprehensive mental wellness/sobriety plan.  Patient Goals: Patient states their goal for treatment is to "I want to check out the stuff on the wall (resources)."  Discharge Plan or Barriers: No psychosocial barriers identified at this time, patient to return to place of residence when appropriate for discharge.   Reason for Continuation of Hospitalization: Delusions  Medication stabilization  Estimated  Length of Stay: 1-7 days   Scribe for Treatment Team: Larose Kells 06/22/2022 9:22 AM

## 2022-06-22 NOTE — [facility]HH Suicide Risk Assessment (Signed)
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  Patient well-known from recent admissions.  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  Petitioned by mother because she saw him acting abnormally shaking and talking to himself and using amphetamine.  Patient denies any suicidal ideation and denies homicidal ideation.  Behavior has been calm without any agitation.  Continued Clinical Symptoms:  Alcohol Use Disorder Identification Test Final Score (AUDIT): 0 The "Alcohol Use Disorders Identification Test", Guidelines for Use in Primary Care, Second Edition.  World Pharmacologist [facility]). Score between 0-7:  no or low risk or alcohol related problems. Score between 8-15:  moderate risk of alcohol related problems. Score between 16-19:  high risk of alcohol related problems. Score 20 or above:  warrants further diagnostic evaluation for alcohol dependence and treatment.   CLINICAL FACTORS:   Alcohol/Substance Abuse/Dependencies Schizophrenia:   Paranoid or undifferentiated type   Musculoskeletal: Strength & Muscle Tone: within normal limits Gait & Station: normal Patient leans: N/A  Psychiatric Specialty Exam:  Presentation  General Appearance:  Bizarre  Eye Contact: Fleeting  Speech: Garbled  Speech Volume: Normal  Handedness: Right   Mood and Affect  Mood: Anxious (clearly under influence of a substance)  Affect: Inappropriate   Thought Process  Thought Processes: Disorganized  Descriptions of Associations:Loose  Orientation:Full  (Time, Place and Person)  Thought Content:Scattered; Illogical  History of Schizophrenia/Schizoaffective disorder:Yes  Duration of Psychotic Symptoms:Less than six months  Hallucinations:Hallucinations: Visual Description of Visual Hallucinations: appears to be repsonidng to internal stimuli  Ideas of Reference:Delusions ("my parents are dead")  Suicidal Thoughts:Suicidal Thoughts: -- (does not respond to question)  Homicidal Thoughts:Homicidal Thoughts: -- (does not repsond to question)   Sensorium  Memory: Recent Poor  Judgment: Impaired  Insight: Lacking   Executive Functions  Concentration: Poor  Attention Span: Poor  Recall: [place] of Knowledge: Poor  Language: Poor   Psychomotor Activity  Psychomotor Activity: Psychomotor Activity: Increased; Restlessness; Mannerisms   Assets  Assets: Physical Health; Resilience; Social Support; Housing   Sleep  Sleep: Sleep: Poor    Physical Exam: Physical Exam Vitals reviewed.  Constitutional:      Appearance: Normal appearance.  HENT:     Head: Normocephalic and atraumatic.     Mouth/Throat:     Pharynx: Oropharynx is clear.  Eyes:     Pupils: Pupils are equal, round, and reactive to light.  Cardiovascular:     Rate and Rhythm: Normal rate and regular rhythm.  Pulmonary:     Effort: Pulmonary effort is normal.     Breath sounds: Normal breath sounds.  Abdominal:     General: Abdomen is flat.     Palpations: Abdomen is soft.  Musculoskeletal:        General: Normal range of motion.  Skin:    General: Skin is warm and dry.  Neurological:     General: No focal deficit present.     Mental Status: He is alert. Mental status is at baseline.  Psychiatric:        Attention and Perception: Attention normal.        Mood and Affect:  Mood normal.        Speech: Speech normal.        Behavior: Behavior is cooperative.        Thought Content: Thought content normal.        Cognition and  Memory: Memory is impaired.    Review of Systems  Constitutional: Negative.   HENT: Negative.    Eyes: Negative.   Respiratory: Negative.    Cardiovascular: Negative.   Gastrointestinal: Negative.   Musculoskeletal: Negative.   Skin: Negative.   Neurological: Negative.   Psychiatric/Behavioral:  Positive for substance abuse. Negative for depression, hallucinations and suicidal ideas.    Blood pressure 130/80, pulse 97, temperature 98.3 F (36.8 C), temperature source Oral, resp. rate 20, height 5\' 11"  (1.803 m), weight 70 kg, SpO2 100 %. Body mass index is 21.52 kg/m.   COGNITIVE FEATURES THAT CONTRIBUTE TO RISK:  Thought constriction (tunnel vision)    SUICIDE RISK:   Minimal: No identifiable suicidal ideation.  Patients presenting with no risk factors but with morbid ruminations; may be classified as minimal risk based on the severity of the depressive symptoms  PLAN OF CARE: Restart medication as it was when he was discharged the other day.  Engage in individual and group therapy.  Attended treatment team today.  Ongoing assessment of dangerousness prior to discharge  I certify that inpatient services furnished can reasonably be expected to improve the patient's condition.   Alethia Berthold, MD 06/22/2022, 1:19 PM

## 2022-06-22 NOTE — Progress Notes (Signed)
Patient admitted from [facility], report received from [place], [place]. Patient calm and cooperative upon arrival. Pt is sleepy as he was given sleep medication before coming down to the BMU. Patient known from previous admission. Patient denies SI/HI/AVH. Pt has been non med compliant since discharging last week. Pt oriented to the unit and his room. Pt given education, support, and encouragement to be active in his treatment plan. Pt being monitored Q 15 minutes for safety per unit protocol, remains safe on the unit

## 2022-06-22 NOTE — Group Note (Signed)
[facility]HH LCSW Group Therapy Note   Group Date: 06/22/2022 Start Time: 1300 End Time: 1400  Type of Therapy and Topic:  Group Therapy:  Feelings around Relapse and Recovery  Participation Level:  Did Not Attend    Description of Group:    Patients in this group will discuss emotions they experience before and after a relapse. They will process how experiencing these feelings, or avoidance of experiencing them, relates to having a relapse. Facilitator will guide patients to explore emotions they have related to recovery. Patients will be encouraged to process which emotions are more powerful. They will be guided to discuss the emotional reaction significant others in their lives may have to patients' relapse or recovery. Patients will be assisted in exploring ways to respond to the emotions of others without this contributing to a relapse.  Therapeutic Goals: Patient will identify two or more emotions that lead to relapse for them:  Patient will identify two emotions that result when they relapse:  Patient will identify two emotions related to recovery:  Patient will demonstrate ability to communicate their needs through discussion and/or role plays.   Summary of Patient Progress: X   Therapeutic Modalities:   Cognitive Behavioral Therapy Solution-Focused Therapy Assertiveness Training Relapse Prevention Therapy   Kamrie Fanton R Kyshawn Teal, LCSW 

## 2022-06-22 NOTE — Plan of Care (Signed)
  Problem: Nutrition: Goal: Adequate nutrition will be maintained Outcome: Progressing   Problem: Nutritional: Goal: Ability to achieve adequate nutritional intake will improve Outcome: Progressing   Problem: Self-Care: Goal: Ability to participate in self-care as condition permits will improve Outcome: Progressing

## 2022-06-22 NOTE — Plan of Care (Signed)
Patient new to the unit tonight, hasn't had time to progress  Problem: Education: Goal: Knowledge of General Education information will improve Description: Including pain rating scale, medication(s)/side effects and non-pharmacologic comfort measures Outcome: Not Progressing   Problem: Health Behavior/Discharge Planning: Goal: Ability to manage health-related needs will improve Outcome: Not Progressing   Problem: Clinical Measurements: Goal: Ability to maintain clinical measurements within normal limits will improve Outcome: Not Progressing Goal: Will remain free from infection Outcome: Not Progressing Goal: Diagnostic test results will improve Outcome: Not Progressing Goal: Respiratory complications will improve Outcome: Not Progressing Goal: Cardiovascular complication will be avoided Outcome: Not Progressing   Problem: Activity: Goal: Risk for activity intolerance will decrease Outcome: Not Progressing   Problem: Nutrition: Goal: Adequate nutrition will be maintained Outcome: Not Progressing   Problem: Coping: Goal: Level of anxiety will decrease Outcome: Not Progressing   Problem: Elimination: Goal: Will not experience complications related to bowel motility Outcome: Not Progressing Goal: Will not experience complications related to urinary retention Outcome: Not Progressing   Problem: Pain Managment: Goal: General experience of comfort will improve Outcome: Not Progressing   Problem: Safety: Goal: Ability to remain free from injury will improve Outcome: Not Progressing   Problem: Skin Integrity: Goal: Risk for impaired skin integrity will decrease Outcome: Not Progressing   Problem: Education: Goal: Knowledge of disease or condition will improve Outcome: Not Progressing Goal: Understanding of discharge needs will improve Outcome: Not Progressing   Problem: Health Behavior/Discharge Planning: Goal: Ability to identify changes in lifestyle to reduce  recurrence of condition will improve Outcome: Not Progressing Goal: Identification of resources available to assist in meeting health care needs will improve Outcome: Not Progressing   Problem: Physical Regulation: Goal: Complications related to the disease process, condition or treatment will be avoided or minimized Outcome: Not Progressing   Problem: Safety: Goal: Ability to remain free from injury will improve Outcome: Not Progressing   Problem: Activity: Goal: Will verbalize the importance of balancing activity with adequate rest periods Outcome: Not Progressing   Problem: Education: Goal: Will be free of psychotic symptoms Outcome: Not Progressing Goal: Knowledge of the prescribed therapeutic regimen will improve Outcome: Not Progressing   Problem: Coping: Goal: Coping ability will improve Outcome: Not Progressing Goal: Will verbalize feelings Outcome: Not Progressing   Problem: Health Behavior/Discharge Planning: Goal: Compliance with prescribed medication regimen will improve Outcome: Not Progressing   Problem: Nutritional: Goal: Ability to achieve adequate nutritional intake will improve Outcome: Not Progressing   Problem: Role Relationship: Goal: Ability to communicate needs accurately will improve Outcome: Not Progressing Goal: Ability to interact with others will improve Outcome: Not Progressing   Problem: Safety: Goal: Ability to redirect hostility and anger into socially appropriate behaviors will improve Outcome: Not Progressing Goal: Ability to remain free from injury will improve Outcome: Not Progressing   Problem: Self-Care: Goal: Ability to participate in self-care as condition permits will improve Outcome: Not Progressing   Problem: Self-Concept: Goal: Will verbalize positive feelings about self Outcome: Not Progressing

## 2022-06-22 NOTE — BHH Counselor (Signed)
Adult Comprehensive Assessment  Patient ID: Ronald [name], male   DOB: 03-30-1996, 27 y.o.   MRN: [id number]  Information Source: Information source: Patient  Current Stressors:  Patient states their primary concerns and needs for treatment are:: During assessment, patient states he has no concerns for his mental heatlh. States his mother is vindictive and calls law enfocement for no apparent reason. Per chart review, patient is positive for amphetamines; described as liskly meth intoxication. Patient was sitting in the rain amoung other bizzare behaviors. Patient acknolweges he uses IV Methamphetamine use and cannabis use. States his parents do not agree with drug use; finds himself in a cycle of hospitalization. States he gets upset with his "resources" (drugs) come up missing around his family leading to altercations with family memebers. Patient states their goals for this hospitilization and ongoing recovery are:: Patient denies having any goals for hospitalization. Educational / Learning stressors: none rerported Employment / Job issues: patient is unemployed Family Relationships: none reported Museum/gallery curator / Lack of resources (include bankruptcy): patient has no income Housing / Lack of housing: patient is not happy living with mother and other family, seeking independent living situation Physical health (include injuries & life threatening diseases): none reported Social relationships: none reported Substance abuse: see SUD section for details Bereavement / Loss: reports death of father in Sep 23, 2021  Living/Environment/Situation:  Living Arrangements: Parent, Other relatives Living conditions (as described by patient or guardian): states living conditions are WNL Who else lives in the home?: patient lives with mother and brother How long has patient lived in current situation?: unknown What is atmosphere in current home: Chaotic  Family History:  Marital status: Single Are you sexually  active?: No What is your sexual orientation?: Bisexual Has your sexual activity been affected by drugs, alcohol, medication, or emotional stress?: N/A Does patient have children?: No  Childhood History:  By whom was/is the patient raised?: Both parents Additional childhood history information: states he did not meet his father until age 74 y/o Description of patient's relationship with caregiver when they were a child: states he had a better relationship with the TV than his parents Patient's description of current relationship with people who raised him/her: reports conflicted relationship with mother; father is deceased How were you disciplined when you got in trouble as a child/adolescent?: Unable to assess. Does patient have siblings?: Yes Number of Siblings: 3 Description of patient's current relationship with siblings: states he gets along well with his deaf brother, does not talk with his other two brothers Did patient suffer any verbal/emotional/physical/sexual abuse as a child?: No Did patient suffer from severe childhood neglect?: No Has patient ever been sexually abused/assaulted/raped as an adolescent or adult?: No Was the patient ever a victim of a crime or a disaster?: No Witnessed domestic violence?: No Has patient been affected by domestic violence as an adult?: No  Education:  Highest grade of school patient has completed: HS Diploma Currently a student?: No Learning disability?: No  Employment/Work Situation:   Employment Situation: Unemployed (since Dec 2023) What is the Longest Time Patient has Held a Job?: "A year or maybe like 10 months." Where was the Patient Employed at that Time?: [employer] Has Patient ever Been in the [employer]?: No  Financial Resources:   Financial resources: No income, Support from parents / caregiver, Medicaid Does patient have a Programmer, applications or guardian?: No  Alcohol/Substance Abuse:   Social History   Substance and Sexual  Activity  Alcohol Use Yes   Alcohol/week: 2.0  standard drinks of alcohol   Types: 2 Cans of beer per week   Comment: unable to determine quantity/frequency   Social History   Substance and Sexual Activity  Drug Use Yes   Types: Marijuana, Methamphetamines   Comment: unable to determine quantity/frequency   Tobacco Use: High Risk (06/22/2022)   Patient History    Smoking Tobacco Use: Every Day    Smokeless Tobacco Use: Never    Passive Exposure: Not on file   If attempted suicide, did drugs/alcohol play a role in this?: No Alcohol/Substance Abuse Treatment Hx: Denies past history  Social Support System:   Heritage manager System: Poor Describe Community Support System: states his family is not supportive of him in general Type of faith/religion: unknown How does patient's faith help to cope with current illness?: unknown  Leisure/Recreation:   Do You Have Hobbies?: No  Strengths/Needs:   Patient states these barriers may affect/interfere with their treatment: none reported Patient states these barriers may affect their return to the community: none reported Other important information patient would like considered in planning for their treatment: none reported  Discharge Plan:   Currently receiving community mental health services: Yes (From Whom) (has presented for initial appointment w/ [facility]) Does patient have access to transportation?: No Does patient have financial barriers related to discharge medications?: No ([place] Medicaid) Will patient be returning to same living situation after discharge?: Yes  Summary/Recommendations:   Summary and Recommendations (to be completed by the evaluator): 27 y/o male w/ dx of Shcizophrenia, undifferentiated from [place] w/ [place] Medicaid admitted due to psychotic features. During assessment, patient states he has no concerns for his mental heatlh. States his mother is vindictive and calls law enfocement for no apparent  reason. Per chart review, patient is positive for amphetamines; described as likely meth intoxication. Patient was sitting in the rain amoung other bizzare behaviors. Patient acknolweges he uses IV Methamphetamine and cannabis. States his parents do not agree with drug use; finds himself in a cycle of hospitalization as a result. States he gets upset when his "resources" (drugs) come up missing around his family leading to altercations with family memebers. Patient denies having any goals for hospitalization.  Patient presents as restless, though mostly cooperative. Affect is Anxious, congruent with mood and context. Appearance is slightly neglected. Speech volume is WNL, speed is rapid, and content is somewhat difficult to follow. No evidence of memory impairment Concentration partly impaired by substance induced psychotic features though improving with amphetamine detox. Patient oriented to person, place, time, and situation. Currently denies SI, HI, AVH.   Patient has been seen for assessment at [facility]; currently declining consent for [facility] to share medical records with outpatient mental health providers. Therapeutic recommendations include further crisis stabilization, medication management, group therapy, and case management.   Durenda Hurt. 06/22/2022

## 2022-06-23 DIAGNOSIS — F200 Paranoid schizophrenia: Secondary | ICD-10-CM | POA: Diagnosis not present

## 2022-06-23 NOTE — Plan of Care (Signed)
D- Patient alert and oriented. Patient presents in a pleasant mood on assessment stating that he slept good last night and had no complaints to voice to this Probation officer. Patient denies SI, HI, AVH, and pain at this time. Patient also denies any signs/symptoms of depression and anxiety. Patient had no stated goals for today.  A- Support and encouragement provided. Routine safety checks conducted every 15 minutes. Patient informed to notify staff with problems or concerns.  R- Patient contracts for safety at this time. Patient receptive, calm, and cooperative. Patient interacts well with others on the unit. Patient remains safe at this time.  Problem: Education: Goal: Knowledge of General Education information will improve Description: Including pain rating scale, medication(s)/side effects and non-pharmacologic comfort measures Outcome: Progressing   Problem: Health Behavior/Discharge Planning: Goal: Ability to manage health-related needs will improve Outcome: Progressing   Problem: Clinical Measurements: Goal: Ability to maintain clinical measurements within normal limits will improve Outcome: Progressing Goal: Will remain free from infection Outcome: Progressing Goal: Diagnostic test results will improve Outcome: Progressing Goal: Respiratory complications will improve Outcome: Progressing Goal: Cardiovascular complication will be avoided Outcome: Progressing   Problem: Activity: Goal: Risk for activity intolerance will decrease Outcome: Progressing   Problem: Nutrition: Goal: Adequate nutrition will be maintained Outcome: Progressing   Problem: Coping: Goal: Level of anxiety will decrease Outcome: Progressing   Problem: Elimination: Goal: Will not experience complications related to bowel motility Outcome: Progressing Goal: Will not experience complications related to urinary retention Outcome: Progressing   Problem: Pain Managment: Goal: General experience of comfort will  improve Outcome: Progressing   Problem: Safety: Goal: Ability to remain free from injury will improve Outcome: Progressing   Problem: Skin Integrity: Goal: Risk for impaired skin integrity will decrease Outcome: Progressing   Problem: Education: Goal: Knowledge of disease or condition will improve Outcome: Progressing Goal: Understanding of discharge needs will improve Outcome: Progressing   Problem: Health Behavior/Discharge Planning: Goal: Ability to identify changes in lifestyle to reduce recurrence of condition will improve Outcome: Progressing Goal: Identification of resources available to assist in meeting health care needs will improve Outcome: Progressing   Problem: Physical Regulation: Goal: Complications related to the disease process, condition or treatment will be avoided or minimized Outcome: Progressing   Problem: Safety: Goal: Ability to remain free from injury will improve Outcome: Progressing   Problem: Activity: Goal: Will verbalize the importance of balancing activity with adequate rest periods Outcome: Progressing   Problem: Education: Goal: Will be free of psychotic symptoms Outcome: Progressing Goal: Knowledge of the prescribed therapeutic regimen will improve Outcome: Progressing   Problem: Coping: Goal: Coping ability will improve Outcome: Progressing Goal: Will verbalize feelings Outcome: Progressing   Problem: Health Behavior/Discharge Planning: Goal: Compliance with prescribed medication regimen will improve Outcome: Progressing   Problem: Nutritional: Goal: Ability to achieve adequate nutritional intake will improve Outcome: Progressing   Problem: Role Relationship: Goal: Ability to communicate needs accurately will improve Outcome: Progressing Goal: Ability to interact with others will improve Outcome: Progressing   Problem: Safety: Goal: Ability to redirect hostility and anger into socially appropriate behaviors will  improve Outcome: Progressing Goal: Ability to remain free from injury will improve Outcome: Progressing   Problem: Self-Care: Goal: Ability to participate in self-care as condition permits will improve Outcome: Progressing   Problem: Self-Concept: Goal: Will verbalize positive feelings about self Outcome: Progressing

## 2022-06-23 NOTE — BHH Group Notes (Signed)
[facility] Group Notes:  (Nursing/MHT/Case Management/Adjunct)  Date:  06/23/2022  Time:  4:17 PM  Type of Therapy:  Psychoeducational Skills  Participation Level:  Active  Participation Quality:  Appropriate and Attentive  Affect:  Appropriate  Cognitive:  Alert and Appropriate  Insight:  Appropriate  Engagement in Group:  Engaged  Modes of Intervention:  Activity and Socialization  Summary of Progress/Problems:  Adela Lank [facility] 06/23/2022, 4:17 PM

## 2022-06-23 NOTE — Progress Notes (Signed)
Patient has been isolative to his room this evening. He does present flat and sad at this encounter.  He denies si/hi/avh anxiety and depression. He is med compliant and took his meds without incident. Support and encouragement offered. Q15 minute safety checks in place.     C Butler-Nicholson, LPN

## 2022-06-23 NOTE — Progress Notes (Signed)
[facility] MD Progress Note  06/23/2022 1:01 PM Ronald [name]  MRN:  [id number] Subjective: Patient is seen on rounds.  He does not have any complaints.  Denies any side effects from his medications.  Nurses report no issues. 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 No pertinent surgical history. Family History: History reviewed. No pertinent family history. Family Psychiatric  History: Unremarkable Social History:  Social History   Substance and Sexual Activity  Alcohol Use Yes   Alcohol/week: 2.0 standard drinks of alcohol   Types: 2 Cans of beer per week   Comment: unable to determine quantity/frequency     Social History   Substance and Sexual Activity  Drug Use Yes   Types: Marijuana, Methamphetamines   Comment: unable to determine quantity/frequency    Social History   Socioeconomic History   Marital status: Single    Spouse name: Not on file   Number of children: Not on file   Years of education: Not on file   Highest education level: Not on file  Occupational History   Not on file  Tobacco Use   Smoking status: Every Day    Packs/day: 1.00    Types: Cigarettes   Smokeless tobacco: Never  Vaping Use   Vaping Use: Never used  Substance and Sexual Activity   Alcohol use: Yes    Alcohol/week: 2.0 standard drinks of alcohol    Types: 2 Cans of beer per week    Comment: unable to determine quantity/frequency   Drug use: Yes    Types: Marijuana, Methamphetamines    Comment: unable to determine quantity/frequency   Sexual activity: Yes    Partners: Female    Birth control/protection: Condom  Other Topics Concern   Not on file  Social History Narrative   ** Merged History  Encounter **       Social Determinants of Health   Financial Resource Strain: Not on file  Food Insecurity: No Food Insecurity (06/22/2022)   Hunger Vital Sign    Worried About Running Out of Food in the Last Year: Never true    Ran Out of Food in the Last Year: Never true  Recent Concern: Food Insecurity - Food Insecurity Present (06/18/2022)   Hunger Vital Sign    Worried About Running Out of Food in the Last Year: Often true    Ran Out of Food in the Last Year: Often true  Transportation Needs: No Transportation Needs (06/22/2022)   PRAPARE - Hydrologist (Medical): No    Lack of Transportation (Non-Medical): No  Physical Activity: Not on file  Stress: Not on file  Social Connections: Not on file   Additional Social History:                         Sleep: Fair  Appetite:  Fair  Current Medications: Current Facility-Administered Medications  Medication Dose Route Frequency Provider Last Rate Last Admin   acetaminophen (TYLENOL) tablet 650 mg  650 mg Oral Q6H PRN Evette Georges, NP       alum & mag hydroxide-simeth (MAALOX/MYLANTA) 200-200-20 MG/5ML suspension 30 mL  30 mL Oral Q4H PRN Evette Georges, NP       benztropine (  COGENTIN) tablet 1 mg  1 mg Oral QHS Clapacs, John T, MD   1 mg at 06/22/22 2117   magnesium hydroxide (MILK OF MAGNESIA) suspension 30 mL  30 mL Oral Daily PRN Sindy Guadeloupe, NP       nicotine polacrilex (NICORETTE) gum 2 mg  2 mg Oral PRN Clapacs, John T, MD       risperiDONE (RISPERDAL M-TABS) disintegrating tablet 4 mg  4 mg Oral QHS Clapacs, John T, MD   4 mg at 06/22/22 2118   traZODone (DESYREL) tablet 50 mg  50 mg Oral QHS Clapacs, Jackquline Denmark, MD   50 mg at 06/22/22 2118    Lab Results:  Results for orders placed or performed during the hospital encounter of 06/21/22 (from the past 48 hour(s))  Resp panel by RT-PCR (RSV, Flu A&B, Covid) Anterior Nasal Swab     Status: None   Collection Time: 06/21/22  7:38 PM    Specimen: Anterior Nasal Swab  Result Value Ref Range   SARS Coronavirus 2 by RT PCR NEGATIVE NEGATIVE    Comment: (NOTE) SARS-CoV-2 target nucleic acids are NOT DETECTED.  The SARS-CoV-2 RNA is generally detectable in upper respiratory specimens during the acute phase of infection. The lowest concentration of SARS-CoV-2 viral copies this assay can detect is 138 copies/mL. A negative result does not preclude SARS-Cov-2 infection and should not be used as the sole basis for treatment or other patient management decisions. A negative result may occur with  improper specimen collection/handling, submission of specimen other than nasopharyngeal swab, presence of viral mutation(s) within the areas targeted by this assay, and inadequate number of viral copies(<138 copies/mL). A negative result must be combined with clinical observations, patient history, and epidemiological information. The expected result is Negative.  Fact Sheet for Patients:  BloggerCourse.com  Fact Sheet for Healthcare Providers:  SeriousBroker.it  This test is no t yet approved or cleared by the [place] FDA and  has been authorized for detection and/or diagnosis of SARS-CoV-2 by FDA under an Emergency Use Authorization (EUA). This EUA will remain  in effect (meaning this test can be used) for the duration of the COVID-19 declaration under Section 564(b)(1) of the Act, 21 U.S.C.section 360bbb-3(b)(1), unless the authorization is terminated  or revoked sooner.       Influenza A by PCR NEGATIVE NEGATIVE   Influenza B by PCR NEGATIVE NEGATIVE    Comment: (NOTE) The Xpert Xpress SARS-CoV-2/FLU/RSV plus assay is intended as an aid in the diagnosis of influenza from Nasopharyngeal swab specimens and should not be used as a sole basis for treatment. Nasal washings and aspirates are unacceptable for Xpert Xpress SARS-CoV-2/FLU/RSV testing.  Fact Sheet for  Patients: BloggerCourse.com  Fact Sheet for Healthcare Providers: SeriousBroker.it  This test is not yet approved or cleared by the [place] FDA and has been authorized for detection and/or diagnosis of SARS-CoV-2 by FDA under an Emergency Use Authorization (EUA). This EUA will remain in effect (meaning this test can be used) for the duration of the COVID-19 declaration under Section 564(b)(1) of the Act, 21 U.S.C. section 360bbb-3(b)(1), unless the authorization is terminated or revoked.     Resp Syncytial Virus by PCR NEGATIVE NEGATIVE    Comment: (NOTE) Fact Sheet for Patients: BloggerCourse.com  Fact Sheet for Healthcare Providers: SeriousBroker.it  This test is not yet approved or cleared by the [place] FDA and has been authorized for detection and/or diagnosis of SARS-CoV-2 by FDA under an Emergency Use Authorization (EUA).  This EUA will remain in effect (meaning this test can be used) for the duration of the COVID-19 declaration under Section 564(b)(1) of the Act, 21 U.S.C. section 360bbb-3(b)(1), unless the authorization is terminated or revoked.  Performed at [facility], [address] [place], [place] [address]     Blood Alcohol level:  Lab Results  Component Value Date   [facility] <10 06/21/2022   ETH <10 32/35/5732    Metabolic Disorder Labs: Lab Results  Component Value Date   HGBA1C 5.1 11/02/2021   MPG 99.67 11/02/2021   Lab Results  Component Value Date   PROLACTIN 22.8 (H) 05/08/2019   Lab Results  Component Value Date   CHOL 141 11/02/2021   TRIG 93 11/02/2021   HDL 69 11/02/2021   CHOLHDL 2.0 11/02/2021   VLDL 19 11/02/2021   LDLCALC 53 11/02/2021   LDLCALC 56 05/08/2019    Physical Findings: AIMS:  , ,  ,  ,    CIWA:    COWS:     Musculoskeletal: Strength & Muscle Tone: within normal limits Gait & Station:  normal Patient leans: N/A  Psychiatric Specialty Exam:  Presentation  General Appearance:  Bizarre  Eye Contact: Fleeting  Speech: Garbled  Speech Volume: Normal  Handedness: Right   Mood and Affect  Mood: Anxious (clearly under influence of a substance)  Affect: Inappropriate   Thought Process  Thought Processes: Disorganized  Descriptions of Associations:Loose  Orientation:Full (Time, Place and Person)  Thought Content:Scattered; Illogical  History of Schizophrenia/Schizoaffective disorder:Yes  Duration of Psychotic Symptoms:Less than six months  Hallucinations:No data recorded Ideas of Reference:Delusions ("my parents are dead")  Suicidal Thoughts:No data recorded Homicidal Thoughts:No data recorded  Sensorium  Memory: Recent Poor  Judgment: Impaired  Insight: Lacking   Executive Functions  Concentration: Poor  Attention Span: Poor  Recall: [place] of Knowledge: Poor  Language: Poor   Psychomotor Activity  Psychomotor Activity:No data recorded  Assets  Assets: Physical Health; Resilience; Social Support; Housing   Sleep  Sleep:No data recorded   Physical Exam: Physical Exam Vitals and nursing note reviewed.  Constitutional:      Appearance: Normal appearance. He is normal weight.  Neurological:     General: No focal deficit present.     Mental Status: He is alert and oriented to person, place, and time.  Psychiatric:        Mood and Affect: Mood normal.        Behavior: Behavior normal.    Review of Systems  Constitutional: Negative.   HENT: Negative.    Eyes: Negative.   Respiratory: Negative.    Cardiovascular: Negative.   Gastrointestinal: Negative.   Genitourinary: Negative.   Musculoskeletal: Negative.   Skin: Negative.   Neurological: Negative.   Endo/Heme/Allergies: Negative.   Psychiatric/Behavioral: Negative.     Blood pressure (!) 118/102, pulse 92, temperature 97.9 F (36.6 C),  temperature source Oral, resp. rate 20, height 5\' 11"  (1.803 m), weight 70 kg, SpO2 100 %. Body mass index is 21.52 kg/m.   Treatment Plan Summary: Daily contact with patient to assess and evaluate symptoms and progress in treatment, Medication management, and Plan continue current medications.  [place], DO 06/23/2022, 1:01 PM

## 2022-06-24 DIAGNOSIS — F200 Paranoid schizophrenia: Secondary | ICD-10-CM | POA: Diagnosis not present

## 2022-06-24 NOTE — Plan of Care (Signed)
  Problem: Education: Goal: Knowledge of General Education information will improve Description: Including pain rating scale, medication(s)/side effects and non-pharmacologic comfort measures Outcome: Progressing   Problem: Health Behavior/Discharge Planning: Goal: Ability to manage health-related needs will improve Outcome: Progressing   Problem: Clinical Measurements: Goal: Ability to maintain clinical measurements within normal limits will improve Outcome: Progressing Goal: Will remain free from infection Outcome: Progressing Goal: Diagnostic test results will improve Outcome: Progressing Goal: Respiratory complications will improve Outcome: Progressing Goal: Cardiovascular complication will be avoided Outcome: Progressing   Problem: Self-Concept: Goal: Will verbalize positive feelings about self Outcome: Progressing   Problem: Self-Care: Goal: Ability to participate in self-care as condition permits will improve Outcome: Progressing   Problem: Safety: Goal: Ability to redirect hostility and anger into socially appropriate behaviors will improve Outcome: Progressing Goal: Ability to remain free from injury will improve Outcome: Progressing   Problem: Role Relationship: Goal: Ability to communicate needs accurately will improve Outcome: Progressing Goal: Ability to interact with others will improve Outcome: Progressing

## 2022-06-24 NOTE — Progress Notes (Signed)
[facility] MD Progress Note  06/24/2022 11:17 AM Ronald [name]  MRN:  [id number] Subjective: Patient is seen on rounds.  He informs me that he has an orthodontist appointment on Tuesday.  He asked if he could leave tomorrow and I told him he needs to talk with Dr. Weber Cooks but he is been taking his medications and in good controls they just need to set up his outpatient appointments.  He denies any side effects from his medicine. 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 No pertinent surgical history. Family History: History reviewed. No pertinent family history. Family Psychiatric  History: Unremarkable Social History:  Social History   Substance and Sexual Activity  Alcohol Use Yes   Alcohol/week: 2.0 standard drinks of alcohol   Types: 2 Cans of beer per week   Comment: unable to determine quantity/frequency     Social History   Substance and Sexual Activity  Drug Use Yes   Types: Marijuana, Methamphetamines   Comment: unable to determine quantity/frequency    Social History   Socioeconomic History   Marital status: Single    Spouse name: Not on file   Number of children: Not on file   Years of education: Not on file   Highest education level: Not on file  Occupational History   Not on file  Tobacco Use   Smoking status: Every Day    Packs/day: 1.00    Types: Cigarettes   Smokeless tobacco: Never  Vaping Use   Vaping Use: Never used  Substance and Sexual Activity   Alcohol use: Yes    Alcohol/week: 2.0 standard drinks of alcohol    Types: 2 Cans of beer per week    Comment: unable to determine quantity/frequency   Drug use: Yes    Types: Marijuana, Methamphetamines    Comment:  unable to determine quantity/frequency   Sexual activity: Yes    Partners: Female    Birth control/protection: Condom  Other Topics Concern   Not on file  Social History Narrative   ** Merged History Encounter **       Social Determinants of Health   Financial Resource Strain: Not on file  Food Insecurity: No Food Insecurity (06/22/2022)   Hunger Vital Sign    Worried About Running Out of Food in the Last Year: Never true    Ran Out of Food in the Last Year: Never true  Recent Concern: Food Insecurity - Food Insecurity Present (06/18/2022)   Hunger Vital Sign    Worried About Running Out of Food in the Last Year: Often true    Ran Out of Food in the Last Year: Often true  Transportation Needs: No Transportation Needs (06/22/2022)   PRAPARE - Hydrologist (Medical): No    Lack of Transportation (Non-Medical): No  Physical Activity: Not on file  Stress: Not on file  Social Connections: Not on file   Additional Social History:                         Sleep: Good  Appetite:  Good  Current Medications: Current Facility-Administered Medications  Medication Dose Route Frequency Provider Last Rate Last Admin   acetaminophen (TYLENOL) tablet 650 mg  650 mg Oral Q6H PRN Sindy Guadeloupe, NP       alum & mag hydroxide-simeth (MAALOX/MYLANTA) 200-200-20 MG/5ML suspension 30 mL  30 mL Oral Q4H PRN Sindy Guadeloupe, NP       benztropine (COGENTIN) tablet 1 mg  1 mg Oral QHS Clapacs, John T, MD   1 mg at 06/23/22 2115   magnesium hydroxide (MILK OF MAGNESIA) suspension 30 mL  30 mL Oral Daily PRN Sindy Guadeloupe, NP       nicotine polacrilex (NICORETTE) gum 2 mg  2 mg Oral PRN Clapacs, Jackquline Denmark, MD       risperiDONE (RISPERDAL M-TABS) disintegrating tablet 4 mg  4 mg Oral QHS Clapacs, Jackquline Denmark, MD   4 mg at 06/23/22 2115   traZODone (DESYREL) tablet 50 mg  50 mg Oral QHS Clapacs, Jackquline Denmark, MD   50 mg at 06/23/22 2115    Lab Results: No results found for this or  any previous visit (from the past 48 hour(s)).  Blood Alcohol level:  Lab Results  Component Value Date   ETH <10 06/21/2022   ETH <10 06/11/2022    Metabolic Disorder Labs: Lab Results  Component Value Date   HGBA1C 5.1 11/02/2021   MPG 99.67 11/02/2021   Lab Results  Component Value Date   PROLACTIN 22.8 (H) 05/08/2019   Lab Results  Component Value Date   CHOL 141 11/02/2021   TRIG 93 11/02/2021   HDL 69 11/02/2021   CHOLHDL 2.0 11/02/2021   VLDL 19 11/02/2021   LDLCALC 53 11/02/2021   LDLCALC 56 05/08/2019    Physical Findings: AIMS:  , ,  ,  ,    CIWA:    COWS:     Musculoskeletal: Strength & Muscle Tone: within normal limits Gait & Station: normal Patient leans: N/A  Psychiatric Specialty Exam:  Presentation  General Appearance:  Bizarre  Eye Contact: Fleeting  Speech: Garbled  Speech Volume: Normal  Handedness: Right   Mood and Affect  Mood: Anxious (clearly under influence of a substance)  Affect: Inappropriate   Thought Process  Thought Processes: Disorganized  Descriptions of Associations:Loose  Orientation:Full (Time, Place and Person)  Thought Content:Scattered; Illogical  History of Schizophrenia/Schizoaffective disorder:Yes  Duration of Psychotic Symptoms:Less than six months  Hallucinations:No data recorded Ideas of Reference:Delusions ("my parents are dead")  Suicidal Thoughts:No data recorded Homicidal Thoughts:No data recorded  Sensorium  Memory: Recent Poor  Judgment: Impaired  Insight: Lacking   Executive Functions  Concentration: Poor  Attention Span: Poor  Recall: Fair  Fund of Knowledge: Poor  Language: Poor   Psychomotor Activity  Psychomotor Activity:No data recorded  Assets  Assets: Physical Health; Resilience; Social Support; Housing   Sleep  Sleep:No data recorded   Physical Exam: Physical Exam Vitals and nursing note reviewed.  Constitutional:       Appearance: Normal appearance. He is normal weight.  Neurological:     General: No focal deficit present.     Mental Status: He is alert and oriented to person, place, and time.  Psychiatric:        Mood and Affect: Mood normal.        Behavior: Behavior normal.    Review of Systems  Constitutional: Negative.   HENT: Negative.    Eyes: Negative.   Respiratory: Negative.    Cardiovascular: Negative.   Gastrointestinal: Negative.   Genitourinary: Negative.   Musculoskeletal: Negative.   Skin: Negative.   Neurological: Negative.   Endo/Heme/Allergies: Negative.   Psychiatric/Behavioral:  Negative.     Blood pressure 109/61, pulse 88, temperature 97.6 F (36.4 C), temperature source Oral, resp. rate 20, height 5\' 11"  (1.803 m), weight 70 kg, SpO2 100 %. Body mass index is 21.52 kg/m.   Treatment Plan Summary: Daily contact with patient to assess and evaluate symptoms and progress in treatment, Medication management, and Plan continue current medications.  Parks Ranger, DO 06/24/2022, 11:17 AM

## 2022-06-24 NOTE — Plan of Care (Signed)
D: Patient alert and oriented. Patient denies pain. Patient denies anxiety and depression. Patient denies SI/HI/AVH. Patient interactive with peers on the unit.  Patient did not have any scheduled medication during this shift.  A: Scheduled medications administered to patient, per MD orders.  Support and encouragement provided to patient.  Q15 minute safety checks maintained.   R: Patient compliant with medication administration and treatment plan. No adverse drug reactions noted. Patient remains safe on the unit at this time. Problem: Education: Goal: Knowledge of General Education information will improve Description: Including pain rating scale, medication(s)/side effects and non-pharmacologic comfort measures Outcome: Progressing   Problem: Nutrition: Goal: Adequate nutrition will be maintained Outcome: Progressing   Problem: Safety: Goal: Ability to remain free from injury will improve Outcome: Progressing   Problem: Nutritional: Goal: Ability to achieve adequate nutritional intake will improve Outcome: Progressing

## 2022-06-24 NOTE — Progress Notes (Signed)
Patient is better today. Brighter and more active on the unit.  Less isolative he is observed engaging with his peers in the day room. He is med compliant and took his meds without incident. Requesting that his Risperdal be reduced to 2mg  as feels its too much medication.  Advised him to speak with the provider and make him aware of his concerns. Q15 minute safety check in place.  Encouraged hi to seek staff with any concerns.    C Butler-Nicholson, LPN

## 2022-06-25 ENCOUNTER — Other Ambulatory Visit (HOSPITAL_BASED_OUTPATIENT_CLINIC_OR_DEPARTMENT_OTHER): Payer: Self-pay

## 2022-06-25 ENCOUNTER — Other Ambulatory Visit: Payer: Self-pay

## 2022-06-25 DIAGNOSIS — F200 Paranoid schizophrenia: Secondary | ICD-10-CM | POA: Diagnosis not present

## 2022-06-25 MED ORDER — TRAZODONE HCL 50 MG PO TABS
50.0000 mg | ORAL_TABLET | Freq: Every day | ORAL | 0 refills | Status: DC
  Filled 2022-06-25: qty 15, 15d supply, fill #0

## 2022-06-25 MED ORDER — RISPERIDONE 2 MG PO TABS
4.0000 mg | ORAL_TABLET | Freq: Every day | ORAL | 0 refills | Status: DC
  Filled 2022-06-25: qty 30, 15d supply, fill #0

## 2022-06-25 MED ORDER — NICOTINE POLACRILEX 2 MG MT GUM: 2.0000 mg | CHEWING_GUM | OROMUCOSAL | 1 refills | Status: DC | PRN

## 2022-06-25 MED ORDER — BENZTROPINE MESYLATE 1 MG PO TABS
1.0000 mg | ORAL_TABLET | Freq: Every day | ORAL | 0 refills | Status: DC
  Filled 2022-06-25: qty 15, 15d supply, fill #0

## 2022-06-25 MED ORDER — RISPERIDONE 4 MG PO TBDP: 4.0000 mg | ORAL_TABLET | Freq: Every day | ORAL | 1 refills | Status: DC

## 2022-06-25 MED ORDER — NICOTINE POLACRILEX 2 MG MT GUM
2.0000 mg | CHEWING_GUM | OROMUCOSAL | 0 refills | Status: DC | PRN
  Filled 2022-06-25: qty 110, 30d supply, fill #0

## 2022-06-25 MED ORDER — BENZTROPINE MESYLATE 1 MG PO TABS: 1.0000 mg | ORAL_TABLET | Freq: Every day | ORAL | 1 refills | Status: DC

## 2022-06-25 MED ORDER — TRAZODONE HCL 50 MG PO TABS: 50.0000 mg | ORAL_TABLET | Freq: Every day | ORAL | 1 refills | Status: DC

## 2022-06-25 NOTE — BHH Suicide Risk Assessment (Signed)
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[place])   Total Time spent with patient: 30 minutes  Musculoskeletal: Strength & Muscle Tone: within normal limits Gait & Station: normal Patient leans: N/A  Psychiatric Specialty Exam  Presentation  General Appearance:  Bizarre  Eye Contact: Fleeting  Speech: Garbled  Speech Volume: Normal  Handedness: Right   Mood and Affect  Mood: Anxious (clearly under influence of a substance)  Duration of Depression Symptoms: Less than two weeks  Affect: Inappropriate   Thought Process  Thought Processes: Disorganized  Descriptions of Associations:Loose  Orientation:Full (Time, Place and Person)  Thought Content:Scattered; Illogical  History of Schizophrenia/Schizoaffective disorder:Yes  Duration of Psychotic Symptoms:Less than six months  Hallucinations:No data recorded Ideas of Reference:Delusions ("my parents are dead")  Suicidal Thoughts:No data recorded Homicidal Thoughts:No data recorded  Sensorium  Memory: Recent Poor  Judgment: Impaired  Insight: Lacking   Executive Functions  Concentration: Poor  Attention Span: Poor  Recall: [place] of Knowledge: Poor  Language: Poor   Psychomotor Activity  Psychomotor Activity:No data recorded  Assets  Assets: Physical Health; Resilience; Social Support; Housing   Sleep  Sleep:No data recorded  Physical Exam: Physical Exam Vitals reviewed.  Constitutional:      Appearance: Normal appearance.  HENT:     Head: Normocephalic and atraumatic.     Mouth/Throat:     Pharynx: Oropharynx is clear.  Eyes:     Pupils: Pupils are equal, round, and reactive to light.  Cardiovascular:     Rate and Rhythm: Normal rate and regular rhythm.  Pulmonary:     Effort: Pulmonary effort is  normal.     Breath sounds: Normal breath sounds.  Abdominal:     General: Abdomen is flat.     Palpations: Abdomen is soft.  Musculoskeletal:        General: Normal range of motion.  Skin:    General: Skin is warm and dry.  Neurological:     General: No focal deficit present.     Mental Status: He is alert. Mental status is at baseline.  Psychiatric:        Attention and Perception: Attention normal.        Mood and Affect: Mood normal.        Speech: Speech normal.        Behavior: Behavior normal.        Thought Content: Thought content normal.        Cognition and Memory: Cognition normal.    Review of Systems  Constitutional: Negative.   HENT: Negative.    Eyes: Negative.   Respiratory: Negative.    Cardiovascular: Negative.   Gastrointestinal: Negative.   Musculoskeletal: Negative.   Skin: Negative.   Neurological: Negative.   Psychiatric/Behavioral: Negative.     Blood pressure 110/74, pulse 84, temperature 98.8 F (37.1 C), temperature source Oral, resp. rate 16, height 5\' 11"  (1.803 m), weight 70 kg, SpO2 100 %. Body mass index is 21.52 kg/m.  Mental Status Per Nursing Assessment::   On Admission:  NA  Demographic Factors:  Male  Loss Factors: NA  Historical Factors: Impulsivity  Risk Reduction Factors:   Positive therapeutic relationship  Continued Clinical Symptoms:  Schizophrenia:   Paranoid or undifferentiated type  Cognitive Features That Contribute To Risk:  None    Suicide Risk:  Minimal: No identifiable suicidal ideation.  Patients presenting with  no risk factors but with morbid ruminations; may be classified as minimal risk based on the severity of the depressive symptoms    Plan Of Care/Follow-up recommendations:  Other:  Recommend patient continue currently prescribed medicine and follow-up with [facility] and discontinue use of methamphetamine and follow-up with [facility] for substance abuse treatment as well.  Patient agrees to plan.  No  evidence of acute dangerousness at discharge.  Alethia Berthold, MD 06/25/2022, 10:08 AM

## 2022-06-25 NOTE — Progress Notes (Signed)
Patient pleasant and cooperative on approach. Denies SI,HI and AVH. Verbalized understanding discharge instructions,prescriptions and follow up care. 7 days medicines given to patient. All belongings returned from [employer]. Patient escorted out by staff and transported by cab.

## 2022-06-25 NOTE — Plan of Care (Signed)
  Problem: Health Behavior/Discharge Planning: Goal: Ability to manage health-related needs will improve Outcome: Progressing   Problem: Clinical Measurements: Goal: Ability to maintain clinical measurements within normal limits will improve Outcome: Progressing Goal: Will remain free from infection Outcome: Progressing Goal: Diagnostic test results will improve Outcome: Progressing Goal: Respiratory complications will improve Outcome: Progressing Goal: Cardiovascular complication will be avoided Outcome: Progressing   Problem: Self-Concept: Goal: Will verbalize positive feelings about self Outcome: Progressing   Problem: Self-Care: Goal: Ability to participate in self-care as condition permits will improve Outcome: Progressing   Problem: Safety: Goal: Ability to redirect hostility and anger into socially appropriate behaviors will improve Outcome: Progressing Goal: Ability to remain free from injury will improve Outcome: Progressing   Problem: Role Relationship: Goal: Ability to communicate needs accurately will improve Outcome: Progressing Goal: Ability to interact with others will improve Outcome: Progressing   Problem: Health Behavior/Discharge Planning: Goal: Compliance with prescribed medication regimen will improve Outcome: Progressing   Problem: Elimination: Goal: Will not experience complications related to bowel motility Outcome: Progressing Goal: Will not experience complications related to urinary retention Outcome: Progressing   Problem: Coping: Goal: Level of anxiety will decrease Outcome: Progressing   Problem: Nutrition: Goal: Adequate nutrition will be maintained Outcome: Progressing   Problem: Role Relationship: Goal: Ability to communicate needs accurately will improve Outcome: Progressing Goal: Ability to interact with others will improve Outcome: Progressing   Problem: Health Behavior/Discharge Planning: Goal: Compliance with  prescribed medication regimen will improve Outcome: Progressing   Problem: Coping: Goal: Coping ability will improve Outcome: Progressing Goal: Will verbalize feelings Outcome: Progressing   Problem: Activity: Goal: Will verbalize the importance of balancing activity with adequate rest periods Outcome: Progressing

## 2022-06-25 NOTE — Progress Notes (Signed)
He is active on the unit.  He gets along well with is peers. He is med compliant and received his meds without issue. Denies si/hi/avh. Endorses anxiety and depression.  Was given application and information to [facility] and educated on the what the program offers. Advised to talk to social work to get assistance with getting the application turned in. Safe on the unit with q15 minute safety checks. Encouraged him to come to the nurses station with any concerns.      C Butler-Nicholson, LPN

## 2022-06-25 NOTE — Progress Notes (Signed)
  [facility] Adult Case Management Discharge Plan :  Will you be returning to the same living situation after discharge:  Yes,  pt reports that he is returning home.  At discharge, do you have transportation home?: Yes,  CSW to assist with transportation needs. Do you have the ability to pay for your medications: Yes,  [employer].  Release of information consent forms completed and in the chart;  Patient's signature needed at discharge.  Patient to Follow up at:  Follow-up Information     [facility].   Why: Appointment is scheduled for 06/27/2022 at [place].  Thanks! Contact information: [place] [address] [phone]                 Next level of care provider has access to [facility] and Suicide Prevention discussed: Yes,  SPE completed with the patient.  Patient declined collateral contact.     Has patient been referred to the Quitline?: Patient refused referral  Patient has been referred for addiction treatment: Pt. refused referral  Rozann Lesches, LCSW 06/25/2022, 11:14 AM

## 2022-06-25 NOTE — Discharge Summary (Signed)
Physician Discharge Summary Note  Patient:  Ronald [name] is an 27 y.o., male MRN:  [id number] DOB:  1995/12/19 Patient phone:  [phone] (home)  Patient address:   [address] [place] [address] [place] [address],  Total Time spent with patient: 30 minutes  Date of Admission:  06/22/2022 Date of Discharge: 06/25/2022  Reason for Admission: Patient was admitted under petition filed by family because of return of psychotic behavior  Principal Problem: Schizophrenia 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  Past Medical History:  Past Medical History:  Diagnosis Date   Asthma    History reviewed. No pertinent surgical history. Family History: History reviewed. No pertinent family history. Family Psychiatric  History: See previous Social History:  Social History   Substance and Sexual Activity  Alcohol Use Yes   Alcohol/week: 2.0 standard drinks of alcohol   Types: 2 Cans of beer per week   Comment: unable to determine quantity/frequency     Social History   Substance and Sexual Activity  Drug Use Yes   Types: Marijuana, Methamphetamines   Comment: unable to determine quantity/frequency    Social History   Socioeconomic History   Marital status: Single    Spouse name: Not on file   Number of children: Not on file   Years of education: Not on file   Highest education level: Not on file  Occupational History   Not on file  Tobacco Use   Smoking status: Every Day    Packs/day: 1.00    Types: Cigarettes   Smokeless tobacco: Never  Vaping Use   Vaping Use: Never used  Substance and Sexual Activity   Alcohol use: Yes    Alcohol/week: 2.0 standard drinks of alcohol    Types: 2 Cans of beer per week    Comment: unable to determine quantity/frequency   Drug use: Yes     Types: Marijuana, Methamphetamines    Comment: unable to determine quantity/frequency   Sexual activity: Yes    Partners: Female    Birth control/protection: Condom  Other Topics Concern   Not on file  Social History Narrative   ** Merged History Encounter **       Social Determinants of Health   Financial Resource Strain: Not on file  Food Insecurity: No Food Insecurity (06/22/2022)   Hunger Vital Sign    Worried About Running Out of Food in the Last Year: Never true    Ran Out of Food in the Last Year: Never true  Recent Concern: Food Insecurity - Food Insecurity Present (06/18/2022)   Hunger Vital Sign    Worried About Running Out of Food in the Last Year: Often true    Ran Out of Food in the Last Year: Often true  Transportation Needs: No Transportation Needs (06/22/2022)   PRAPARE - Hydrologist (Medical): No    Lack of Transportation (Non-Medical): No  Physical Activity: Not on file  Stress: Not on file  Social Connections: Not on file    Hospital Course: Patient was admitted to the hospital and continued on 15-minute checks.  Patient was cooperative with treatment while he was here.  He did not display any dangerous aggressive or suicidal behavior.  Patient has taken his Risperdal.  Denies any psychotic symptoms including hallucinations or paranoia at discharge.  He was strongly recommended  to stop using methamphetamine and encouraged to do this for his own sake rather than worrying about his family.  Patient was praised for having followed up already with [facility] and will be referred there to continue follow-up.  Prescriptions and medicine supplied.  Physical Findings: AIMS:  , ,  ,  ,    CIWA:    COWS:     Musculoskeletal: Strength & Muscle Tone: within normal limits Gait & Station: normal Patient leans: N/A   Psychiatric Specialty Exam:  Presentation  General Appearance:  Bizarre  Eye Contact: Fleeting  Speech: Garbled  Speech  Volume: Normal  Handedness: Right   Mood and Affect  Mood: Anxious (clearly under influence of a substance)  Affect: Inappropriate   Thought Process  Thought Processes: Disorganized  Descriptions of Associations:Loose  Orientation:Full (Time, Place and Person)  Thought Content:Scattered; Illogical  History of Schizophrenia/Schizoaffective disorder:Yes  Duration of Psychotic Symptoms:Less than six months  Hallucinations:No data recorded Ideas of Reference:Delusions ("my parents are dead")  Suicidal Thoughts:No data recorded Homicidal Thoughts:No data recorded  Sensorium  Memory: Recent Poor  Judgment: Impaired  Insight: Lacking   Executive Functions  Concentration: Poor  Attention Span: Poor  Recall: Fair  Fund of Knowledge: Poor  Language: Poor   Psychomotor Activity  Psychomotor Activity:No data recorded  Assets  Assets: Physical Health; Resilience; Social Support; Housing   Sleep  Sleep:No data recorded   Physical Exam: Physical Exam Vitals and nursing note reviewed.  Constitutional:      Appearance: Normal appearance.  HENT:     Head: Normocephalic and atraumatic.     Mouth/Throat:     Pharynx: Oropharynx is clear.  Eyes:     Pupils: Pupils are equal, round, and reactive to light.  Cardiovascular:     Rate and Rhythm: Normal rate and regular rhythm.  Pulmonary:     Effort: Pulmonary effort is normal.     Breath sounds: Normal breath sounds.  Abdominal:     General: Abdomen is flat.     Palpations: Abdomen is soft.  Musculoskeletal:        General: Normal range of motion.  Skin:    General: Skin is warm and dry.  Neurological:     General: No focal deficit present.     Mental Status: He is alert. Mental status is at baseline.  Psychiatric:        Attention and Perception: Attention normal.        Mood and Affect: Mood normal.        Speech: Speech normal.        Behavior: Behavior normal.        Thought  Content: Thought content normal.        Cognition and Memory: Cognition normal.        Judgment: Judgment normal.    Review of Systems  Constitutional: Negative.   HENT: Negative.    Eyes: Negative.   Respiratory: Negative.    Cardiovascular: Negative.   Gastrointestinal: Negative.   Musculoskeletal: Negative.   Skin: Negative.   Neurological: Negative.   Psychiatric/Behavioral: Negative.     Blood pressure 110/74, pulse 84, temperature 98.8 F (37.1 C), temperature source Oral, resp. rate 16, height 5\' 11"  (1.803 m), weight 70 kg, SpO2 100 %. Body mass index is 21.52 kg/m.   Social History   Tobacco Use  Smoking Status Every Day   Packs/day: 1.00   Types: Cigarettes  Smokeless Tobacco Never   Tobacco Cessation:  A prescription for an  FDA-approved tobacco cessation medication provided at discharge   Blood Alcohol level:  Lab Results  Component Value Date   [facility] <10 06/21/2022   ETH <10 26/94/8546    Metabolic Disorder Labs:  Lab Results  Component Value Date   HGBA1C 5.1 11/02/2021   MPG 99.67 11/02/2021   Lab Results  Component Value Date   PROLACTIN 22.8 (H) 05/08/2019   Lab Results  Component Value Date   CHOL 141 11/02/2021   TRIG 93 11/02/2021   HDL 69 11/02/2021   CHOLHDL 2.0 11/02/2021   VLDL 19 11/02/2021   LDLCALC 53 11/02/2021   LDLCALC 56 05/08/2019    See Psychiatric Specialty Exam and Suicide Risk Assessment completed by Attending Physician prior to discharge.  Discharge destination:  Home  Is patient on multiple antipsychotic therapies at discharge:  No   Has Patient had three or more failed trials of antipsychotic monotherapy by history:  No  Recommended Plan for Multiple Antipsychotic Therapies: NA  Discharge Instructions     Diet - low sodium heart healthy   Complete by: As directed    Increase activity slowly   Complete by: As directed       Allergies as of 06/25/2022   No Known Allergies      Medication List      STOP taking these medications    hydrOXYzine 50 MG tablet Commonly known as: ATARAX       TAKE these medications      Indication  benztropine 1 MG tablet Commonly known as: COGENTIN Take 1 tablet (1 mg total) by mouth at bedtime.  Indication: Extrapyramidal Reaction caused by Medications   nicotine polacrilex 2 MG gum Commonly known as: NICORETTE Chew 1 each (2 mg total) by mouth as needed for smoking cessation.  Indication: Nicotine Addiction   risperiDONE 2 MG tablet Commonly known as: RISPERDAL Take 2 tablets (4 mg total) by mouth at bedtime.  Indication: Schizophrenia   traZODone 50 MG tablet Commonly known as: DESYREL Take 1 tablet (50 mg total) by mouth at bedtime. What changed:  when to take this reasons to take this  Indication: Trouble Sleeping         Follow-up recommendations:  Other:  Follow-up with [facility].  Continue medicine.  Discontinue use of amphetamines and other abusable drugs  Comments: See above  Signed: Alethia Berthold, MD 06/25/2022, 10:05 AM

## 2022-06-25 NOTE — Group Note (Signed)
[facility] LCSW Group Therapy Note    Group Date: 06/25/2022 Start Time: 1300 End Time: 1400  Type of Therapy and Topic:  Group Therapy:  Overcoming Obstacles  Participation Level:  [facility] PARTICIPATION LEVEL: Active  Mood: blunted   Description of Group:   In this group patients will be encouraged to explore what they see as obstacles to their own wellness and recovery. They will be guided to discuss their thoughts, feelings, and behaviors related to these obstacles. The group will process together ways to cope with barriers, with attention given to specific choices patients can make. Each patient will be challenged to identify changes they are motivated to make in order to overcome their obstacles. This group will be process-oriented, with patients participating in exploration of their own experiences as well as giving and receiving support and challenge from other group members.  Therapeutic Goals: 1. Patient will identify personal and current obstacles as they relate to admission. 2. Patient will identify barriers that currently interfere with their wellness or overcoming obstacles.  3. Patient will identify feelings, thought process and behaviors related to these barriers. 4. Patient will identify two changes they are willing to make to overcome these obstacles:    Summary of Patient Progress Patient was present for the entirety of the group session. Patient was an active listener and participated in the topic of discussion, provided helpful advice to others, and added nuance to topic of conversation. States his goal to overcome obstacles is to obtain a car. CSW worked with patient to make goal into a "SMART" goal.    Therapeutic Modalities:   Cognitive Behavioral Therapy Solution Focused Therapy Motivational Interviewing Relapse Prevention Therapy   Durenda Hurt, [place]

## 2022-06-25 NOTE — [facility] Counselor (Signed)
Patient provided verbal consent for CSW to contact [facility] for follow up.    Assunta Curtis, MSW, LCSW 06/25/2022 10:18 AM

## 2022-08-29 ENCOUNTER — Emergency Department
Admission: EM | Admit: 2022-08-29 | Discharge: 2022-08-30 | Disposition: A | Discharge status: Home / Self Care | Payer: No Typology Code available for payment source | Attending: Emergency Medicine | Admitting: Emergency Medicine

## 2022-08-29 ENCOUNTER — Other Ambulatory Visit: Payer: Self-pay

## 2022-08-29 ENCOUNTER — Emergency Department
Admission: EM | Admit: 2022-08-29 | Discharge: 2022-08-29 | Disposition: A | Discharge status: Home / Self Care | Payer: Medicaid Other | Attending: Emergency Medicine | Admitting: Emergency Medicine

## 2022-08-29 DIAGNOSIS — F209 Schizophrenia, unspecified: Secondary | ICD-10-CM

## 2022-08-29 DIAGNOSIS — R109 Unspecified abdominal pain: Secondary | ICD-10-CM | POA: Insufficient documentation

## 2022-08-29 DIAGNOSIS — F23 Brief psychotic disorder: Secondary | ICD-10-CM | POA: Diagnosis not present

## 2022-08-29 DIAGNOSIS — F29 Unspecified psychosis not due to a substance or known physiological condition: Secondary | ICD-10-CM | POA: Diagnosis not present

## 2022-08-29 DIAGNOSIS — R4689 Other symptoms and signs involving appearance and behavior: Secondary | ICD-10-CM

## 2022-08-29 DIAGNOSIS — Z046 Encounter for general psychiatric examination, requested by authority: Secondary | ICD-10-CM | POA: Diagnosis present

## 2022-08-29 LAB — COMPREHENSIVE METABOLIC PANEL
ALT: 20 U/L (ref 0–44)
AST: 36 U/L (ref 15–41)
Albumin: 5.4 g/dL — ABNORMAL HIGH (ref 3.5–5.0)
Alkaline Phosphatase: 65 U/L (ref 38–126)
Anion gap: 12 (ref 5–15)
BUN: 16 mg/dL (ref 6–20)
CO2: 26 mmol/L (ref 22–32)
Calcium: 10.4 mg/dL — ABNORMAL HIGH (ref 8.9–10.3)
Chloride: 99 mmol/L (ref 98–111)
Creatinine, Ser: 0.98 mg/dL (ref 0.61–1.24)
GFR, Estimated: >60 mL/min (ref >60)
Glucose, Bld: 87 mg/dL (ref 70–99)
Potassium: 3.5 mmol/L (ref 3.5–5.1)
Sodium: 137 mmol/L (ref 135–145)
Total Bilirubin: 1.7 mg/dL — ABNORMAL HIGH (ref 0.3–1.2)
Total Protein: 9.8 g/dL — ABNORMAL HIGH (ref 6.5–8.1)

## 2022-08-29 LAB — ETHANOL: Alcohol, Ethyl (B): <10 mg/dL (ref <10)

## 2022-08-29 LAB — CBC
HCT: 53.5 % — ABNORMAL HIGH (ref 39.0–52.0)
Hemoglobin: 17.7 g/dL — ABNORMAL HIGH (ref 13.0–17.0)
MCH: 30.1 pg (ref 26.0–34.0)
MCHC: 33.1 g/dL (ref 30.0–36.0)
MCV: 91 fL (ref 80.0–100.0)
Platelets: 427 10*3/uL — ABNORMAL HIGH (ref 150–400)
RBC: 5.88 MIL/uL — ABNORMAL HIGH (ref 4.22–5.81)
RDW: 12.4 % (ref 11.5–15.5)
WBC: 8.4 10*3/uL (ref 4.0–10.5)
nRBC: 0 % (ref 0.0–0.2)

## 2022-08-29 LAB — ACETAMINOPHEN LEVEL: Acetaminophen (Tylenol), Serum: <10 ug/mL — ABNORMAL LOW (ref 10–30)

## 2022-08-29 LAB — SALICYLATE LEVEL: Salicylate Lvl: <7 mg/dL — ABNORMAL LOW (ref 7.0–30.0)

## 2022-08-29 MED ORDER — BENZTROPINE MESYLATE 1 MG PO TABS
1.0000 mg | ORAL_TABLET | Freq: Every day | ORAL | Status: DC
  Administered 2022-08-29: 1 mg via ORAL
  Filled 2022-08-29: qty 1

## 2022-08-29 MED ORDER — ALUM & MAG HYDROXIDE-SIMETH 200-200-20 MG/5ML PO SUSP: 30.0000 mL | Freq: Four times a day (QID) | ORAL | Status: DC | PRN

## 2022-08-29 MED ORDER — IBUPROFEN 600 MG PO TABS: 600.0000 mg | ORAL_TABLET | Freq: Three times a day (TID) | ORAL | Status: DC | PRN

## 2022-08-29 MED ORDER — ONDANSETRON HCL 4 MG PO TABS: 4.0000 mg | ORAL_TABLET | Freq: Three times a day (TID) | ORAL | Status: DC | PRN

## 2022-08-29 MED ORDER — RISPERIDONE 3 MG PO TABS
4.0000 mg | ORAL_TABLET | Freq: Every day | ORAL | Status: DC
  Administered 2022-08-29: 4 mg via ORAL
  Filled 2022-08-29: qty 1

## 2022-08-29 MED ORDER — TRAZODONE HCL 100 MG PO TABS
50.0000 mg | ORAL_TABLET | Freq: Every day | ORAL | Status: DC
  Administered 2022-08-29: 50 mg via ORAL
  Filled 2022-08-29: qty 1

## 2022-08-29 NOTE — ED Provider Notes (Signed)
   [facility] Provider Note    Event Date/Time   First MD Initiated Contact with Patient 08/29/22 [phone]     (approximate)   History   Chief Complaint: Abdominal pain  HPI  Ronald [name] is a 27 y.o. male with a history of schizophrenia who came to the ED initially reporting abdominal pain.  The patient is ambulatory, eating potato chips, on my assessment denies any pain.  No vomiting or fever.  Denies SI HI or hallucinations.  He is to take Risperdal, has been off it for quite some time.     Physical Exam   Triage Vital Signs: ED Triage Vitals  Enc Vitals Group     BP 08/29/22 0740 120/72     Pulse Rate 08/29/22 0740 88     Resp 08/29/22 0740 18     Temp 08/29/22 0740 98.1 F (36.7 C)     Temp Source 08/29/22 0740 Oral     SpO2 08/29/22 0740 99 %     Weight --      Height --      Head Circumference --      Peak Flow --      Pain Score 08/29/22 0711 0     Pain Loc --      Pain Edu? --      Excl. in [place]? --     Most recent vital signs: Vitals:   08/29/22 0740  BP: 120/72  Pulse: 88  Resp: 18  Temp: 98.1 F (36.7 C)  SpO2: 99%    General: Awake, no distress. CV:  Good peripheral perfusion.  Resp:  Normal effort.  Abd:  No distention.  Other:  No wounds   ED Results / Procedures / Treatments   Labs (all labs ordered are listed, but only abnormal results are displayed) Labs Reviewed - No data to display   EKG    RADIOLOGY    PROCEDURES:  Procedures   MEDICATIONS ORDERED IN ED: Medications - No data to display   IMPRESSION / MDM / [place] / ED COURSE  I reviewed the triage vital signs and the nursing notes.  Patient's presentation is most consistent with severe exacerbation of chronic illness.  Patient presents with symptoms of schizophrenia in the setting of medication noncompliance, medically stable.  Not committable but would benefit from psychiatry evaluation.  Agreeable for voluntary  assessment.  The patient has been placed in psychiatric observation due to the need to provide a safe environment for the patient while obtaining psychiatric consultation and evaluation, as well as ongoing medical and medication management to treat the patient's condition.  The patient has not been placed under full IVC at this time.      FINAL CLINICAL IMPRESSION(S) / ED DIAGNOSES   Final diagnoses:  Schizophrenia, unspecified type     Rx / DC Orders   ED Discharge Orders     None        Note:  This document was prepared using Dragon voice recognition software and may include unintentional dictation errors.   Carrie Mew, MD 08/29/22 [phone]

## 2022-08-29 NOTE — ED Triage Notes (Signed)
Pt to ED and is homeless. Pt reports abdominal pain that happened after he ate something "a while ago". Pt eating a bag of chips as entering and exiting ER lobby and continues eating a bag of chips while being registered and triaged. RN asking if eating currently is causing abdominal pain and pt states no. Pt denies currently having abdominal pain. Pt ambulatory in NAD.

## 2022-08-29 NOTE — ED Notes (Signed)
Pt sleeping.  Refused vital signs.  Will try again later

## 2022-08-29 NOTE — ED Notes (Signed)
Patient transferred to room [place], He is calm and cooperative, but guarded , He was given His breakfast tray, but states that He does not eat pork. Stafff wll monitor for safety.

## 2022-08-29 NOTE — BH Assessment (Signed)
Comprehensive Clinical Assessment (CCA) Screening, Triage and Referral Note  08/29/2022 Ronald [name] [id number]  Ronald [name], 27 year old male who presents to [facility] ED voluntarily for treatment. Per RN note, Patient transferred to room [place], He is calm and cooperative, but guarded, He was given His breakfast tray, but states that He does not eat pork. Staff will monitor for safety.   During TTS assessment pt presents alert and oriented x 4, restless but cooperative, and mood-congruent with affect. The pt does not appear to be responding to internal or external stimuli. Neither is the pt presenting with any delusional thinking. Pt verified the information provided to triage RN.   Pt identifies his main complaint to be that he was not feeling well. Patient reports his allergies have been bothering him. Patient is poor historian and does not answer how he was brought to the ED. Patient presents disorganized and refuses to answer questions about his substance use. Patient was positive for cocaine and amphetamines and does not feel as though there is an issue. Patient reports he has not been taking his medications. Pt denies current SI/HI/AH/VH. Pt contracts for safety. Patient declined Psych Team contacting mom for collateral.    Per Jerene Pitch, NP, pt is recommended for overnight observation to be reassessed in the morning.    Chief Complaint:  Chief Complaint  Patient presents with   Psychiatric Evaluation   Visit Diagnosis: Hx of schizophrenia   Patient Reported Information How did you hear about [place]? Family/Friend  What Is the Reason for Your Visit/Call Today? Patient was brought to the due to complaints with allergies and "not feeling well."  How Long Has This Been Causing You Problems? <Week  What Do You Feel Would Help You the Most Today? -- (Assessment only)   Have You Recently Had Any Thoughts About Hurting Yourself? No  Are You Planning to Commit Suicide/Harm Yourself At This  time? No   Have you Recently Had Thoughts About [place]? No  Are You Planning to Harm Someone at This Time? No  Explanation: No data recorded  Have You Used Any Alcohol or Drugs in the Past 24 Hours? Yes  How Long Ago Did You Use Drugs or Alcohol? No data recorded What Did You Use and How Much? Cocaine and methamphetamines   Do You Currently Have a Therapist/Psychiatrist? Yes  Name of Therapist/Psychiatrist: No data recorded  Have You Been Recently Discharged From Any Office Practice or Programs? No  Explanation of Discharge From Practice/Program: No data recorded   CCA Screening Triage Referral Assessment Type of Contact: Face-to-Face  Telemedicine Service Delivery:   Is this Initial or Reassessment?   Date Telepsych consult ordered in [facility]:    Time Telepsych consult ordered in [facility]:    Location of Assessment: [facility] ED  Provider Location: [facility] ED    Collateral Involvement: Phillips Grout (Mother)   [phone]   Does Patient Have a Court Appointed Legal Guardian? No data recorded Name and Contact of Legal Guardian: No data recorded If Minor and Not Living with Parent(s), Who has Custody? n/a  Is CPS involved or ever been involved? Never  Is APS involved or ever been involved? Never   Patient Determined To Be At Risk for Harm To Self or Others Based on Review of Patient Reported Information or Presenting Complaint? No  Method: No data recorded Availability of Means: No data recorded Intent: No data recorded Notification Required: No data recorded Additional Information for Danger to Others Potential: No data  recorded Additional Comments for Danger to Others Potential: No data recorded Are There Guns or Other Weapons in [place]? No  Types of Guns/Weapons: No data recorded Are These Weapons Safely Secured?                            No  Who Could Verify You Are Able To Have These Secured: No data recorded Do You Have any Outstanding Charges,  Pending Court Dates, Parole/Probation? No data recorded Contacted To Inform of Risk of Harm To Self or Others: Family/Significant Other:   Does Patient Present under Involuntary Commitment? No    [place] of Residence: [place]   Patient Currently Receiving the Following Services: Medication Management   Determination of Need: Emergent (2 hours)   Options For Referral: ED Visit; Chemical Dependency Intensive Outpatient Therapy (CDIOP); Medication Management   Discharge Disposition:     Eula Fried, Counselor, LCAS-A

## 2022-08-29 NOTE — ED Notes (Signed)
VOL/Pending Consult

## 2022-08-29 NOTE — ED Provider Notes (Signed)
[facility] Provider Note    Event Date/Time   First MD Initiated Contact with Patient 08/29/22 [phone]     (approximate)   History   Psychiatric Evaluation   HPI  Ronald [name] is a 27 y.o. male   presents to the ED originally with complaint of abdominal pain.  Patient has past medical history of schizophrenia, polysubstance abuse and multiple visits to the ED for IVC.  Patient is a very poor historian and does not openly answers questions.  He does admit that he drank alcohol last evening.  He does not give an address or how he got to the ED.  He does deny at this time any intent to harm himself.      Physical Exam   Triage Vital Signs: ED Triage Vitals [08/29/22 0807]  Enc Vitals Group     BP 120/76     Pulse Rate 88     Resp 18     Temp 98.1 F (36.7 C)     Temp Source Oral     SpO2 99 %     Weight      Height      Head Circumference      Peak Flow      Pain Score      Pain Loc      Pain Edu?      Excl. in [place]?     Most recent vital signs: Vitals:   08/29/22 0807  BP: 120/76  Pulse: 88  Resp: 18  Temp: 98.1 F (36.7 C)  SpO2: 99%     General: Awake, no distress.  Mumbles to answer questions and then contradicts himself with either yes or no.  Smiles and laughs at times inappropriately.  Patient possibly is homeless as he has tree leaves on his clothing, however he denies being homeless but does not offer and address or contact information. CV:  Good peripheral perfusion.  Resp:  Normal effort.  Abd:  No distention.  Other:  Moves upper and lower extremities without any difficulty.   ED Results / Procedures / Treatments   Labs (all labs ordered are listed, but only abnormal results are displayed) Labs Reviewed  COMPREHENSIVE METABOLIC PANEL - Abnormal; Notable for the following components:      Result Value   Calcium 10.4 (*)    Total Protein 9.8 (*)    Albumin 5.4 (*)    Total Bilirubin 1.7 (*)    All other components  within normal limits  SALICYLATE LEVEL - Abnormal; Notable for the following components:   Salicylate Lvl [id number] (*)    All other components within normal limits  ACETAMINOPHEN LEVEL - Abnormal; Notable for the following components:   Acetaminophen (Tylenol), Serum <10 (*)    All other components within normal limits  CBC - Abnormal; Notable for the following components:   RBC 5.88 (*)    Hemoglobin 17.7 (*)    HCT 53.5 (*)    Platelets 427 (*)    All other components within normal limits  ETHANOL  URINE DRUG SCREEN, QUALITATIVE ([facility] ONLY)     EKG  Deferred   RADIOLOGY Deferred    PROCEDURES:  Critical Care performed:   Procedures   MEDICATIONS ORDERED IN ED: Medications  ibuprofen (ADVIL) tablet 600 mg (has no administration in time range)  ondansetron (ZOFRAN) tablet 4 mg (has no administration in time range)  alum & mag hydroxide-simeth (MAALOX/MYLANTA) 200-200-20 MG/5ML suspension 30 mL (has no administration in  time range)  benztropine (COGENTIN) tablet 1 mg (has no administration in time range)  risperiDONE (RISPERDAL) tablet 4 mg (has no administration in time range)  traZODone (DESYREL) tablet 50 mg (has no administration in time range)     IMPRESSION / MDM / ASSESSMENT AND PLAN / ED COURSE  I reviewed the triage vital signs and the nursing notes.   Differential diagnosis includes, but is not limited to, polysubstance abuse, schizophrenia, psychiatric evaluation.  27 year old male presents to the ED originally with the wrong name and refusing to give contact information or personal information stating that he does not know.  Patient does openly say that he has been admitted to [facility] multiple times.  Patient denies any attempt to harm himself but is extremely disorganized in his thoughts, inability to answer questions, and frequently stating that he does not know the answer when asked simple questions such as who is his mother and where does he live.  Initial  lab work is being done and pod D and patient will be transferred to [facility] for TTS consult when bed is available.   ----------------------------------------- 9:49 AM on 08/29/2022 ----------------------------------------- Patient moved to [facility]    Patient's presentation is most consistent with exacerbation of chronic illness.  FINAL CLINICAL IMPRESSION(S) / ED DIAGNOSES   Final diagnoses:  Abnormal behavior  Schizophrenia, unspecified type     Rx / DC Orders   ED Discharge Orders     None        Note:  This document was prepared using Dragon voice recognition software and may include unintentional dictation errors.   Johnn Hai, PA-C 08/29/22 1406    Carrie Mew, MD 08/29/22 1550

## 2022-08-29 NOTE — ED Notes (Signed)
Snack placed at pts bedside.  Crackers and peanut butter, gold fish, ice cream and water.

## 2022-08-29 NOTE — Consult Note (Addendum)
[facility] Consult   Reason for Consult: psych consult Referring Physician:  Carrie Mew, MD Patient Identification: Ronald [name] MRN:  [id number] Principal Diagnosis: <principal problem not specified> Diagnosis:  Active Problems:   Acute psychosis   Total Time spent with patient: 30 minutes  Subjective:   Ronald [name] is a 27 y.o. male patient known to [facility] with a past psychiatric history of cannabis abuse, alcohol abuse, acute psychosis, stimulant induced psychotic disorder, schizophrenia who presented to [facility] ED unaccompanied voluntarily with chief complaint of abdominal pain. ED staff report patient presented disorganized and poor historian.   On assessment he initially presented laying in bed with covers over his head. Responsive to stimuli. Observed eating lunch at bedside. He is sleeping oriented to person, place, partial to time. Reports his reason for admission was 'I wasn't feeling good. My allergies have been bothering me'. Mood is bizarre slightly labile, affect congruent. Thought content remains clearing with some disorganization; pauses noted. He will not answer questions regarding recent substance use. UDS pending. Denies any SI/HI/AvH at this time; dozes off intermittently during assessment though he is able to report last medications being Risperidone and Trazodone. He contracts for safety. Provider discussed plan to restart medications and observe overnight; he is amenable to plan and treatment. Refused to provide permission to contact mother for collateral information.   Past Psychiatric History: cannabis abuse, alcohol abuse, acute psychosis, stimulant induced psychotic disorder, schizophrenia. Last admitted to [place] to Self:  Risk to Others:  Prior Inpatient Therapy: yes Prior Outpatient Therapy: yes  Past Medical History:  Past Medical History:  Diagnosis Date   Asthma    No past surgical history on file. Family History: No family  history on file. Family Psychiatric  History: not noted Social History:  Social History   Substance and Sexual Activity  Alcohol Use Yes   Alcohol/week: 2.0 standard drinks of alcohol   Types: 2 Cans of beer per week   Comment: unable to determine quantity/frequency     Social History   Substance and Sexual Activity  Drug Use Yes   Types: Marijuana, Methamphetamines   Comment: unable to determine quantity/frequency    Social History   Socioeconomic History   Marital status: Single    Spouse name: Not on file   Number of children: Not on file   Years of education: Not on file   Highest education level: Not on file  Occupational History   Not on file  Tobacco Use   Smoking status: Every Day    Packs/day: 1    Types: Cigarettes   Smokeless tobacco: Never  Vaping Use   Vaping Use: Never used  Substance and Sexual Activity   Alcohol use: Yes    Alcohol/week: 2.0 standard drinks of alcohol    Types: 2 Cans of beer per week    Comment: unable to determine quantity/frequency   Drug use: Yes    Types: Marijuana, Methamphetamines    Comment: unable to determine quantity/frequency   Sexual activity: Yes    Partners: Female    Birth control/protection: Condom  Other Topics Concern   Not on file  Social History Narrative   ** Merged History Encounter **       Social Determinants of Health   Financial Resource Strain: Not on file  Food Insecurity: No Food Insecurity (06/22/2022)   Hunger Vital Sign    Worried About Running Out of Food in the Last Year: Never true  Ran Out of Food in the Last Year: Never true  Recent Concern: Food Insecurity - Food Insecurity Present (06/18/2022)   Hunger Vital Sign    Worried About Running Out of Food in the Last Year: Often true    Ran Out of Food in the Last Year: Often true  Transportation Needs: No Transportation Needs (06/22/2022)   PRAPARE - Hydrologist (Medical): No    Lack of Transportation  (Non-Medical): No  Physical Activity: Not on file  Stress: Not on file  Social Connections: Not on file   Additional Social History:    Allergies:  No Known Allergies  Labs:  Results for orders placed or performed during the hospital encounter of 08/29/22 (from the past 48 hour(s))  Comprehensive metabolic panel     Status: Abnormal   Collection Time: 08/29/22  8:07 AM  Result Value Ref Range   Sodium 137 135 - 145 mmol/L   Potassium 3.5 3.5 - 5.1 mmol/L   Chloride 99 98 - 111 mmol/L   CO2 26 22 - 32 mmol/L   Glucose, Bld 87 70 - 99 mg/dL    Comment: Glucose reference range applies only to samples taken after fasting for at least 8 hours.   BUN 16 6 - 20 mg/dL   Creatinine, Ser 0.98 0.61 - 1.24 mg/dL   Calcium 10.4 (H) 8.9 - 10.3 mg/dL   Total Protein 9.8 (H) 6.5 - 8.1 g/dL   Albumin 5.4 (H) 3.5 - 5.0 g/dL   AST 36 15 - 41 U/L   ALT 20 0 - 44 U/L   Alkaline Phosphatase 65 38 - 126 U/L   Total Bilirubin 1.7 (H) 0.3 - 1.2 mg/dL   GFR, Estimated >60 >60 mL/min    Comment: (NOTE) Calculated using the CKD-EPI Creatinine Equation (2021)    Anion gap 12 5 - 15    Comment: Performed at [facility], [address] [place], [place] [address]  Ethanol     Status: None   Collection Time: 08/29/22  8:07 AM  Result Value Ref Range   Alcohol, Ethyl (B) <10 <10 mg/dL    Comment: (NOTE) Lowest detectable limit for serum alcohol is 10 mg/dL.  For medical purposes only. Performed at [facility], [address] [place], [place] [id number]   Salicylate level     Status: Abnormal   Collection Time: 08/29/22  8:07 AM  Result Value Ref Range   Salicylate Lvl Q000111Q (L) 7.0 - 30.0 mg/dL    Comment: Performed at [facility], [address] [place], [place] [address]  Acetaminophen level     Status: Abnormal   Collection Time: 08/29/22  8:07 AM  Result Value Ref Range   Acetaminophen (Tylenol), Serum <10 (L) 10 - 30 ug/mL    Comment: (NOTE) Therapeutic  concentrations vary significantly. A range of 10-30 ug/mL  may be an effective concentration for many patients. However, some  are best treated at concentrations outside of this range. Acetaminophen concentrations >150 ug/mL at 4 hours after ingestion  and >50 ug/mL at 12 hours after ingestion are often associated with  toxic reactions.  Performed at [facility], [address] [place], [place] [address]   cbc     Status: Abnormal   Collection Time: 08/29/22  8:07 AM  Result Value Ref Range   WBC 8.4 4.0 - 10.5 K/uL   RBC 5.88 (H) 4.22 - 5.81 MIL/uL   Hemoglobin 17.7 (H) 13.0 -  17.0 g/dL   HCT 53.5 (H) 39.0 - 52.0 %   MCV 91.0 80.0 - 100.0 fL   MCH 30.1 26.0 - 34.0 pg   MCHC 33.1 30.0 - 36.0 g/dL   RDW 12.4 11.5 - 15.5 %   Platelets 427 (H) 150 - 400 K/uL   nRBC 0.0 0.0 - 0.2 %    Comment: Performed at [facility], [address], [place], [place] [address]    Current Facility-Administered Medications  Medication Dose Route Frequency Provider Last Rate Last Admin   alum & mag hydroxide-simeth (MAALOX/MYLANTA) 200-200-20 MG/5ML suspension 30 mL  30 mL Oral Q6H PRN Carrie Mew, MD       benztropine (COGENTIN) tablet 1 mg  1 mg Oral QHS Bennett, Christal H, NP       ibuprofen (ADVIL) tablet 600 mg  600 mg Oral Q8H PRN Carrie Mew, MD       ondansetron [facility]) tablet 4 mg  4 mg Oral Q8H PRN Carrie Mew, MD       risperiDONE (RISPERDAL) tablet 4 mg  4 mg Oral QHS Bennett, Christal H, NP       traZODone (DESYREL) tablet 50 mg  50 mg Oral QHS Bennett, Christal H, NP       Current Outpatient Medications  Medication Sig Dispense Refill   benztropine (COGENTIN) 1 MG tablet Take 1 tablet (1 mg total) by mouth at bedtime. 15 tablet 0   nicotine polacrilex (NICORETTE) 2 MG gum Chew 1 each (2 mg total) by mouth as needed for smoking cessation. 50 tablet 0   risperiDONE (RISPERDAL) 2 MG tablet Take 2 tablets (4 mg total) by mouth at bedtime. 30 tablet  0   traZODone (DESYREL) 50 MG tablet Take 1 tablet (50 mg total) by mouth at bedtime. 15 tablet 0    Musculoskeletal: Strength & Muscle Tone: within normal limits Gait & Station: normal Patient leans: N/A  Psychiatric Specialty Exam:  Presentation  General Appearance:  Bizarre  Eye Contact: Fleeting  Speech: Clear and Coherent; Pressured  Speech Volume: Normal  Handedness: Right  Mood and Affect  Mood: Dysphoric  Affect: Inappropriate  Thought Process  Thought Processes: Linear  Descriptions of Associations:Tangential  Orientation:Full (Time, Place and Person)  Thought Content:Illogical  History of Schizophrenia/Schizoaffective disorder:Yes  Duration of Psychotic Symptoms:Less than six months  Hallucinations:Hallucinations: Other (comment) (unable to fully assess)  Ideas of Reference:Delusions  Suicidal Thoughts:Suicidal Thoughts: No  Homicidal Thoughts:Homicidal Thoughts: No   Sensorium  Memory: Recent Poor  Judgment: Impaired  Insight: Lacking; Shallow  Executive Functions  Concentration: Poor  Attention Span: Poor  Recall: [employer] of Knowledge: Fair  Language: Poor  Psychomotor Activity  Psychomotor Activity:Psychomotor Activity: Decreased   Assets  Assets: Physical Health; Resilience  Sleep  Sleep:Sleep: Fair   Physical Exam: Physical Exam Vitals and nursing note reviewed.  Constitutional:      Appearance: He is normal weight.  HENT:     Head: Normocephalic.  Neurological:     Mental Status: He is alert.  Psychiatric:        Attention and Perception: He does not perceive auditory or visual hallucinations.        Mood and Affect: Mood is elated. Affect is labile and inappropriate.        Speech: Speech normal.        Behavior: Behavior is hyperactive.        Thought Content: Thought content is delusional. Thought content does not include homicidal or  suicidal ideation. Thought content does not include  homicidal or suicidal plan.        Judgment: Judgment is impulsive and inappropriate.    Review of Systems  Psychiatric/Behavioral:  Positive for substance abuse.    Blood pressure 120/76, pulse 88, temperature 98.1 F (36.7 C), temperature source Oral, resp. rate 18, SpO2 99 %. There is no height or weight on file to calculate BMI.  Treatment Plan Summary: Daily contact with patient to assess and evaluate symptoms and progress in treatment, Medication management, and Plan   PLAN:  - Continuous Observation to be reassessed in the morning. UDS remains outstanding  - Home medications restarted in ED  Disposition: Supportive therapy provided about ongoing stressors. Discussed crisis plan, support from social network, calling 911, coming to the Emergency Department, and calling Suicide Hotline.  Inda Merlin, NP 08/29/2022 3:15 PM

## 2022-08-29 NOTE — ED Provider Notes (Signed)
Procedures     ----------------------------------------- 8:51 AM on 08/29/2022 ----------------------------------------- Patient denies any pain.  Admits to alcohol use last night but no drug use.  No stimulants.  In conversation, patient thought process is clearly disorganized but he is able to endorse previously being prescribed risperidone, although he has not taken this in a while.  No SI or HI, does not appear to be responding to internal stimuli.  He is agreeable to being seen by psychiatry today.  He is medically stable.  The patient has been placed in psychiatric observation due to the need to provide a safe environment for the patient while obtaining psychiatric consultation and evaluation, as well as ongoing medical and medication management to treat the patient's condition.  The patient has not been placed under full IVC at this time.      Carrie Mew, MD 08/29/22 [phone]

## 2022-08-30 ENCOUNTER — Encounter: Payer: Self-pay | Admitting: Psychiatry

## 2022-08-30 DIAGNOSIS — F23 Brief psychotic disorder: Secondary | ICD-10-CM

## 2022-08-30 NOTE — ED Provider Notes (Signed)
Emergency Medicine Observation Re-evaluation Note  Physical Exam   BP 120/76 (BP Location: Left Arm)   Pulse 88   Temp 98.1 F (36.7 C) (Oral)   Resp 18   SpO2 99%   Patient appears in no acute distress.  ED Course / MDM   No reported events during my shift at the time of this note.   Pt is awaiting dispo from SW   Lucillie Garfinkel MD    Lucillie Garfinkel, MD 08/30/22 [phone]

## 2022-08-30 NOTE — BH Assessment (Signed)
Writer spoke with the patient to complete an updated/reassessment. Patient denies SI/HI and AV/H. Discussed with patient outpatient follow up with treatment [facility]. He reports that he is aware of their services but hasn't followed up with them.

## 2022-08-30 NOTE — Consult Note (Signed)
[facility] Consult   Reason for Consult:  psych consult Referring Physician:  Carrie Mew, MD Patient Identification: Ronald [name] MRN:  [id number] Principal Diagnosis: Acute psychosis Diagnosis:  Principal Problem:   Acute psychosis   Total Time spent with patient: 30 minutes  Subjective:   Ronald [name] is a 27 y.o. male patient known to [facility] with a past psychiatric history of cannabis abuse, alcohol abuse, acute psychosis, stimulant induced psychotic disorder, schizophrenia who presented to [facility] ED unaccompanied voluntarily with chief complaint of abdominal pain. ED staff report patient presented disorganized and poor historian.   Staff report patient has remained calm and cooperative overnight. Eating meals and snacks; sleeping throughout the night. He has been medication compliant with no PRN medications given. No noted signs on psychosis or agitation noted.   On assessment he initially presented asleep in bed, easily aroused. He is alert and oriented. Calm and cooperative. He denies any SI/HI/AVH. Requests assistance with transportation to his location. Denies any safety concerns and contracts for safety. He reports awareness of available community services ([place]) continues to decline any substance abuse treatment.   Past Psychiatric History: cannabis abuse, alcohol abuse, acute psychosis, stimulant induced psychotic disorder, schizophrenia; frequent ED encounters, multiple psych admissions  Risk to Self: pt denies Risk to Others: pt denies Prior Inpatient Therapy: yes Prior Outpatient Therapy: yes  Past Medical History:  Past Medical History:  Diagnosis Date   Asthma    No past surgical history on file. Family History: No family history on file. Family Psychiatric History: not noted Social History:  Social History   Substance and Sexual Activity  Alcohol Use Yes   Alcohol/week: 2.0 standard drinks of alcohol   Types: 2 Cans of beer per week    Comment: unable to determine quantity/frequency     Social History   Substance and Sexual Activity  Drug Use Yes   Types: Marijuana, Methamphetamines   Comment: unable to determine quantity/frequency    Social History   Socioeconomic History   Marital status: Single    Spouse name: Not on file   Number of children: Not on file   Years of education: Not on file   Highest education level: Not on file  Occupational History   Not on file  Tobacco Use   Smoking status: Every Day    Packs/day: 1    Types: Cigarettes   Smokeless tobacco: Never  Vaping Use   Vaping Use: Never used  Substance and Sexual Activity   Alcohol use: Yes    Alcohol/week: 2.0 standard drinks of alcohol    Types: 2 Cans of beer per week    Comment: unable to determine quantity/frequency   Drug use: Yes    Types: Marijuana, Methamphetamines    Comment: unable to determine quantity/frequency   Sexual activity: Yes    Partners: Female    Birth control/protection: Condom  Other Topics Concern   Not on file  Social History Narrative   ** Merged History Encounter **       Social Determinants of Health   Financial Resource Strain: Not on file  Food Insecurity: No Food Insecurity (06/22/2022)   Hunger Vital Sign    Worried About Running Out of Food in the Last Year: Never true    Ran Out of Food in the Last Year: Never true  Recent Concern: Food Insecurity - Food Insecurity Present (06/18/2022)   Hunger Vital Sign    Worried About Running Out of Food in the Last Year:  Often true    Ran Out of Food in the Last Year: Often true  Transportation Needs: No Transportation Needs (06/22/2022)   PRAPARE - Hydrologist (Medical): No    Lack of Transportation (Non-Medical): No  Physical Activity: Not on file  Stress: Not on file  Social Connections: Not on file   Additional Social History:    Allergies:  No Known Allergies  Labs:  Results for orders placed or performed during  the hospital encounter of 08/29/22 (from the past 48 hour(s))  Comprehensive metabolic panel     Status: Abnormal   Collection Time: 08/29/22  8:07 AM  Result Value Ref Range   Sodium 137 135 - 145 mmol/L   Potassium 3.5 3.5 - 5.1 mmol/L   Chloride 99 98 - 111 mmol/L   CO2 26 22 - 32 mmol/L   Glucose, Bld 87 70 - 99 mg/dL    Comment: Glucose reference range applies only to samples taken after fasting for at least 8 hours.   BUN 16 6 - 20 mg/dL   Creatinine, Ser 0.98 0.61 - 1.24 mg/dL   Calcium 10.4 (H) 8.9 - 10.3 mg/dL   Total Protein 9.8 (H) 6.5 - 8.1 g/dL   Albumin 5.4 (H) 3.5 - 5.0 g/dL   AST 36 15 - 41 U/L   ALT 20 0 - 44 U/L   Alkaline Phosphatase 65 38 - 126 U/L   Total Bilirubin 1.7 (H) 0.3 - 1.2 mg/dL   GFR, Estimated >60 >60 mL/min    Comment: (NOTE) Calculated using the CKD-EPI Creatinine Equation (2021)    Anion gap 12 5 - 15    Comment: Performed at [facility], [address] [place], [place] [address]  Ethanol     Status: None   Collection Time: 08/29/22  8:07 AM  Result Value Ref Range   Alcohol, Ethyl (B) <10 <10 mg/dL    Comment: (NOTE) Lowest detectable limit for serum alcohol is 10 mg/dL.  For medical purposes only. Performed at [facility], [address] [place], [place] [id number]   Salicylate level     Status: Abnormal   Collection Time: 08/29/22  8:07 AM  Result Value Ref Range   Salicylate Lvl Q000111Q (L) 7.0 - 30.0 mg/dL    Comment: Performed at [facility], [address] [place], [place] [address]  Acetaminophen level     Status: Abnormal   Collection Time: 08/29/22  8:07 AM  Result Value Ref Range   Acetaminophen (Tylenol), Serum <10 (L) 10 - 30 ug/mL    Comment: (NOTE) Therapeutic concentrations vary significantly. A range of 10-30 ug/mL  may be an effective concentration for many patients. However, some  are best treated at concentrations outside of this range. Acetaminophen concentrations >150 ug/mL at 4  hours after ingestion  and >50 ug/mL at 12 hours after ingestion are often associated with  toxic reactions.  Performed at [facility], [address] [place], [place] [address]   cbc     Status: Abnormal   Collection Time: 08/29/22  8:07 AM  Result Value Ref Range   WBC 8.4 4.0 - 10.5 K/uL   RBC 5.88 (H) 4.22 - 5.81 MIL/uL   Hemoglobin 17.7 (H) 13.0 - 17.0 g/dL   HCT 53.5 (H) 39.0 - 52.0 %   MCV 91.0 80.0 - 100.0 fL   MCH 30.1 26.0 - 34.0 pg   MCHC 33.1 30.0 - 36.0 g/dL  RDW 12.4 11.5 - 15.5 %   Platelets 427 (H) 150 - 400 K/uL   nRBC 0.0 0.0 - 0.2 %    Comment: Performed at [facility], [address] [place], [place] [address]    Current Facility-Administered Medications  Medication Dose Route Frequency Provider Last Rate Last Admin   alum & mag hydroxide-simeth (MAALOX/MYLANTA) 200-200-20 MG/5ML suspension 30 mL  30 mL Oral Q6H PRN Carrie Mew, MD       benztropine (COGENTIN) tablet 1 mg  1 mg Oral QHS Bennett, Christal H, NP   1 mg at 08/29/22 2124   ibuprofen (ADVIL) tablet 600 mg  600 mg Oral Q8H PRN Carrie Mew, MD       ondansetron [facility]) tablet 4 mg  4 mg Oral Q8H PRN Carrie Mew, MD       risperiDONE (RISPERDAL) tablet 4 mg  4 mg Oral QHS Bennett, Christal H, NP   4 mg at 08/29/22 2124   traZODone (DESYREL) tablet 50 mg  50 mg Oral QHS Bennett, Christal H, NP   50 mg at 08/29/22 2124   Current Outpatient Medications  Medication Sig Dispense Refill   benztropine (COGENTIN) 1 MG tablet Take 1 tablet (1 mg total) by mouth at bedtime. 15 tablet 0   nicotine polacrilex (NICORETTE) 2 MG gum Chew 1 each (2 mg total) by mouth as needed for smoking cessation. 50 tablet 0   risperiDONE (RISPERDAL) 2 MG tablet Take 2 tablets (4 mg total) by mouth at bedtime. 30 tablet 0   traZODone (DESYREL) 50 MG tablet Take 1 tablet (50 mg total) by mouth at bedtime. 15 tablet 0    Musculoskeletal: Strength & Muscle Tone: within normal  limits Gait & Station: normal Patient leans: N/A  Psychiatric Specialty Exam:  Presentation  General Appearance:  Casual  Eye Contact: Fleeting  Speech: Clear and Coherent  Speech Volume: Normal  Handedness: Right   Mood and Affect  Mood: Dysphoric  Affect: Congruent   Thought Process  Thought Processes: Linear  Descriptions of Associations:Intact  Orientation:Full (Time, Place and Person)  Thought Content:Logical  History of Schizophrenia/Schizoaffective disorder:Yes  Duration of Psychotic Symptoms:Less than six months  Hallucinations:Hallucinations: None  Ideas of Reference:None  Suicidal Thoughts:Suicidal Thoughts: No  Homicidal Thoughts:Homicidal Thoughts: No  Sensorium  Memory: Immediate Fair; Recent Fair  Judgment: Fair  Insight: Present; Shallow  Executive Functions  Concentration: Fair  Attention Span: Fair  Recall: [employer] of Knowledge: Fair  Language: Fair  Psychomotor Activity  Psychomotor Activity: Psychomotor Activity: Normal  Assets  Assets: Physical Health; Resilience  Sleep  Sleep: Sleep: Fair  Physical Exam: Physical Exam Vitals and nursing note reviewed.  Constitutional:      Appearance: He is normal weight.  HENT:     Head: Normocephalic.     Nose: Nose normal.     Mouth/Throat:     Mouth: Mucous membranes are moist.     Pharynx: Oropharynx is clear.  Cardiovascular:     Pulses: Normal pulses.  Pulmonary:     Effort: Pulmonary effort is normal.  Musculoskeletal:        General: Normal range of motion.  Skin:    General: Skin is warm and dry.  Neurological:     Mental Status: He is alert and oriented to person, place, and time.  Psychiatric:        Attention and Perception: Attention and perception normal. He does not perceive auditory or visual hallucinations.  Mood and Affect: Mood and affect normal.        Speech: Speech normal.        Behavior: Behavior is cooperative.         Thought Content: Thought content normal. Thought content is not paranoid or delusional. Thought content does not include homicidal or suicidal ideation. Thought content does not include homicidal or suicidal plan.        Cognition and Memory: Cognition and memory normal.        Judgment: Judgment normal. Judgment is not impulsive or inappropriate.    Review of Systems  All other systems reviewed and are negative.  Blood pressure 113/66, pulse 96, temperature 98 F (36.7 C), temperature source Oral, resp. rate 18, SpO2 100 %. There is no height or weight on file to calculate BMI.  Treatment Plan Summary: Daily contact with patient to assess and evaluate symptoms and progress in treatment, Medication management, and Plan   PLAN:  - Patient to discharge home with plan to follow up with outpatient resources ([facility]) as he has declined any substance abuse treatment at this time.   Disposition: No evidence of imminent risk to self or others at present.   Patient does not meet criteria for psychiatric inpatient admission. Supportive therapy provided about ongoing stressors. Discussed crisis plan, support from social network, calling 911, coming to the Emergency Department, and calling Suicide Hotline.  Inda Merlin, NP 08/30/2022 10:15 AM

## 2022-11-04 ENCOUNTER — Emergency Department (EMERGENCY_DEPARTMENT_HOSPITAL)
Admission: EM | Admit: 2022-11-04 | Discharge: 2022-11-04 | Disposition: A | Discharge status: Other Acute Inpatient Hospital | Payer: Medicaid Other | Source: Home / Self Care | Attending: Emergency Medicine | Admitting: Emergency Medicine

## 2022-11-04 ENCOUNTER — Encounter: Payer: Self-pay | Admitting: Psychiatry

## 2022-11-04 ENCOUNTER — Inpatient Hospital Stay
Admission: AD | Admit: 2022-11-04 | Discharge: 2022-11-12 | DRG: 885 | Disposition: A | Discharge status: Home / Self Care | Payer: Medicaid Other | Source: Intra-hospital | Attending: Psychiatry | Admitting: Psychiatry

## 2022-11-04 ENCOUNTER — Other Ambulatory Visit: Payer: Self-pay

## 2022-11-04 DIAGNOSIS — Z91148 Patient's other noncompliance with medication regimen for other reason: Secondary | ICD-10-CM

## 2022-11-04 DIAGNOSIS — Z79899 Other long term (current) drug therapy: Secondary | ICD-10-CM | POA: Diagnosis not present

## 2022-11-04 DIAGNOSIS — F209 Schizophrenia, unspecified: Secondary | ICD-10-CM | POA: Insufficient documentation

## 2022-11-04 DIAGNOSIS — F15959 Other stimulant use, unspecified with stimulant-induced psychotic disorder, unspecified: Principal | ICD-10-CM | POA: Diagnosis present

## 2022-11-04 DIAGNOSIS — R45851 Suicidal ideations: Secondary | ICD-10-CM | POA: Insufficient documentation

## 2022-11-04 DIAGNOSIS — F15159 Other stimulant abuse with stimulant-induced psychotic disorder, unspecified: Secondary | ICD-10-CM | POA: Diagnosis present

## 2022-11-04 DIAGNOSIS — G4700 Insomnia, unspecified: Secondary | ICD-10-CM | POA: Diagnosis present

## 2022-11-04 DIAGNOSIS — J45909 Unspecified asthma, uncomplicated: Secondary | ICD-10-CM | POA: Diagnosis present

## 2022-11-04 DIAGNOSIS — Y900 Blood alcohol level of less than 20 mg/100 ml: Secondary | ICD-10-CM | POA: Insufficient documentation

## 2022-11-04 DIAGNOSIS — F17210 Nicotine dependence, cigarettes, uncomplicated: Secondary | ICD-10-CM | POA: Diagnosis present

## 2022-11-04 DIAGNOSIS — F203 Undifferentiated schizophrenia: Secondary | ICD-10-CM | POA: Diagnosis not present

## 2022-11-04 LAB — COMPREHENSIVE METABOLIC PANEL
ALT: 15 U/L (ref 0–44)
AST: 42 U/L — ABNORMAL HIGH (ref 15–41)
Albumin: 4.2 g/dL (ref 3.5–5.0)
Alkaline Phosphatase: 48 U/L (ref 38–126)
Anion gap: 12 (ref 5–15)
BUN: 28 mg/dL — ABNORMAL HIGH (ref 6–20)
CO2: 22 mmol/L (ref 22–32)
Calcium: 9.4 mg/dL (ref 8.9–10.3)
Chloride: 102 mmol/L (ref 98–111)
Creatinine, Ser: 1.03 mg/dL (ref 0.61–1.24)
GFR, Estimated: >60 mL/min (ref >60)
Glucose, Bld: 168 mg/dL — ABNORMAL HIGH (ref 70–99)
Potassium: 3.4 mmol/L — ABNORMAL LOW (ref 3.5–5.1)
Sodium: 136 mmol/L (ref 135–145)
Total Bilirubin: 0.8 mg/dL (ref 0.3–1.2)
Total Protein: 8.1 g/dL (ref 6.5–8.1)

## 2022-11-04 LAB — CBC
HCT: 48.9 % (ref 39.0–52.0)
Hemoglobin: 16.1 g/dL (ref 13.0–17.0)
MCH: 29.7 pg (ref 26.0–34.0)
MCHC: 32.9 g/dL (ref 30.0–36.0)
MCV: 90.1 fL (ref 80.0–100.0)
Platelets: 385 10*3/uL (ref 150–400)
RBC: 5.43 MIL/uL (ref 4.22–5.81)
RDW: 12.5 % (ref 11.5–15.5)
WBC: 7.2 10*3/uL (ref 4.0–10.5)
nRBC: 0 % (ref 0.0–0.2)

## 2022-11-04 LAB — SALICYLATE LEVEL: Salicylate Lvl: <7 mg/dL — ABNORMAL LOW (ref 7.0–30.0)

## 2022-11-04 LAB — ETHANOL: Alcohol, Ethyl (B): <10 mg/dL (ref <10)

## 2022-11-04 LAB — ACETAMINOPHEN LEVEL: Acetaminophen (Tylenol), Serum: <10 ug/mL — ABNORMAL LOW (ref 10–30)

## 2022-11-04 MED ORDER — GABAPENTIN 300 MG PO CAPS
300.0000 mg | ORAL_CAPSULE | Freq: Three times a day (TID) | ORAL | Status: DC
  Administered 2022-11-04: 300 mg via ORAL
  Filled 2022-11-04: qty 1

## 2022-11-04 MED ORDER — DIPHENHYDRAMINE HCL 25 MG PO CAPS: 50.0000 mg | ORAL_CAPSULE | Freq: Two times a day (BID) | ORAL | Status: DC | PRN

## 2022-11-04 MED ORDER — TRAZODONE HCL 50 MG PO TABS
50.0000 mg | ORAL_TABLET | Freq: Every day | ORAL | Status: DC
  Administered 2022-11-04 – 2022-11-11 (×8): 50 mg via ORAL
  Filled 2022-11-04 (×8): qty 1

## 2022-11-04 MED ORDER — GABAPENTIN 300 MG PO CAPS
300.0000 mg | ORAL_CAPSULE | Freq: Three times a day (TID) | ORAL | Status: DC
  Administered 2022-11-05 – 2022-11-12 (×23): 300 mg via ORAL
  Filled 2022-11-04 (×14): qty 1
  Filled 2022-11-04: qty 3
  Filled 2022-11-04 (×8): qty 1

## 2022-11-04 MED ORDER — ACETAMINOPHEN 325 MG PO TABS: 650.0000 mg | ORAL_TABLET | Freq: Four times a day (QID) | ORAL | Status: DC | PRN

## 2022-11-04 MED ORDER — HALOPERIDOL 5 MG PO TABS: 5.0000 mg | ORAL_TABLET | Freq: Two times a day (BID) | ORAL | Status: DC | PRN

## 2022-11-04 MED ORDER — ALUM & MAG HYDROXIDE-SIMETH 200-200-20 MG/5ML PO SUSP: 30.0000 mL | ORAL | Status: DC | PRN

## 2022-11-04 MED ORDER — RISPERIDONE 1 MG PO TABS: 4.0000 mg | ORAL_TABLET | Freq: Every day | ORAL | Status: DC

## 2022-11-04 MED ORDER — RISPERIDONE 1 MG PO TABS
4.0000 mg | ORAL_TABLET | Freq: Every day | ORAL | Status: DC
  Administered 2022-11-04 – 2022-11-11 (×8): 4 mg via ORAL
  Filled 2022-11-04 (×8): qty 4

## 2022-11-04 MED ORDER — TRAZODONE HCL 50 MG PO TABS: 50.0000 mg | ORAL_TABLET | Freq: Every day | ORAL | Status: DC

## 2022-11-04 MED ORDER — HALOPERIDOL LACTATE 5 MG/ML IJ SOLN: 5.0000 mg | Freq: Two times a day (BID) | INTRAMUSCULAR | Status: DC | PRN

## 2022-11-04 MED ORDER — MAGNESIUM HYDROXIDE 400 MG/5ML PO SUSP: 30.0000 mL | Freq: Every day | ORAL | Status: DC | PRN

## 2022-11-04 MED ORDER — BENZTROPINE MESYLATE 1 MG PO TABS: 1.0000 mg | ORAL_TABLET | Freq: Every day | ORAL | Status: DC

## 2022-11-04 MED ORDER — DIPHENHYDRAMINE HCL 50 MG/ML IJ SOLN: 50.0000 mg | Freq: Two times a day (BID) | INTRAMUSCULAR | Status: DC | PRN

## 2022-11-04 MED ORDER — BENZTROPINE MESYLATE 1 MG PO TABS
1.0000 mg | ORAL_TABLET | Freq: Every day | ORAL | Status: DC
  Administered 2022-11-04 – 2022-11-11 (×8): 1 mg via ORAL
  Filled 2022-11-04 (×8): qty 1

## 2022-11-04 NOTE — ED Provider Notes (Signed)
[facility] Provider Note    Event Date/Time   First MD Initiated Contact with Patient 11/04/22 1001     (approximate)   History   Chief Complaint Suicidal   HPI  Ronald [name] is a 27 y.o. male with past medical history of schizophrenia and polysubstance abuse who presents to the ED for psychiatric evaluation.  Mother reports that patient left the house last week and was not heard from for about 5 days.  She is concerned that he went on a drug binge and states he has not been taking any of his medications in the past few days.  When he returned to the home, mother found patient to be acting erratically and making suicidal statements.  He apparently stated that he did not want to be around anymore and wanted to go to sleep forever.  Patient currently denies any suicidal or homicidal ideation, also denies any drug use.  He denies any medical complaints.     Physical Exam   Triage Vital Signs: ED Triage Vitals  Enc Vitals Group     BP 11/04/22 0958 95/73     Pulse Rate 11/04/22 0958 (!) 103     Resp 11/04/22 0958 18     Temp 11/04/22 0958 98.2 F (36.8 C)     Temp Source 11/04/22 0958 Oral     SpO2 11/04/22 1000 98 %     Weight --      Height 11/04/22 1000 5\' 11"  (1.803 m)     Head Circumference --      Peak Flow --      Pain Score 11/04/22 1000 0     Pain Loc --      Pain Edu? --      Excl. in GC? --     Most recent vital signs: Vitals:   11/04/22 0958 11/04/22 1000  BP: 95/73   Pulse: (!) 103   Resp: 18   Temp: 98.2 F (36.8 C)   SpO2:  98%    Constitutional: Alert and oriented. Eyes: Conjunctivae are normal. Head: Atraumatic. Nose: No congestion/rhinnorhea. Mouth/Throat: Mucous membranes are moist.  Cardiovascular: Normal rate, regular rhythm. Grossly normal heart sounds.  2+ radial pulses bilaterally. Respiratory: Normal respiratory effort.  No retractions. Lungs CTAB. Gastrointestinal: Soft and nontender. No  distention. Musculoskeletal: No lower extremity tenderness nor edema.  Neurologic:  Normal speech and language. No gross focal neurologic deficits are appreciated.    ED Results / Procedures / Treatments   Labs (all labs ordered are listed, but only abnormal results are displayed) Labs Reviewed  COMPREHENSIVE METABOLIC PANEL - Abnormal; Notable for the following components:      Result Value   Potassium 3.4 (*)    Glucose, Bld 168 (*)    BUN 28 (*)    AST 42 (*)    All other components within normal limits  SALICYLATE LEVEL - Abnormal; Notable for the following components:   Salicylate Lvl <7.0 (*)    All other components within normal limits  ACETAMINOPHEN LEVEL - Abnormal; Notable for the following components:   Acetaminophen (Tylenol), Serum <10 (*)    All other components within normal limits  ETHANOL  CBC  URINE DRUG SCREEN, QUALITATIVE ([facility] ONLY)    PROCEDURES:  Critical Care performed: No  Procedures   MEDICATIONS ORDERED IN ED: Medications - No data to display   IMPRESSION / MDM / ASSESSMENT AND PLAN / ED COURSE  I reviewed the triage vital signs and  the nursing notes.                              27 y.o. male with past medical history of schizophrenia and polysubstance abuse who presents to the ED for psychiatric evaluation after he left home and did not take medications for multiple days, returned home and made suicidal statements to his mother.  Patient's presentation is most consistent with acute presentation with potential threat to life or bodily function.  Differential diagnosis includes, but is not limited to, psychosis, depression, suicidal ideation, anxiety, medication noncompliance, substance abuse.  Patient nontoxic-appearing and in no acute distress, vital signs are unremarkable.  Speech is pressured and he does seem to be acting erratically, although denies any of the suicidal statements reported by his mother.  We will place him under IVC and  have psychiatry evaluate.  Lab results for medical clearance are pending at this time.  Screening labs show no significant anemia, leukocytosis, lecture abnormality, or AKI.  LFTs are unremarkable, Tylenol and salicylate levels are undetectable.  Patient may be medically cleared for psychiatric disposition.  Psych eval is pending at this time.  The patient has been placed in psychiatric observation due to the need to provide a safe environment for the patient while obtaining psychiatric consultation and evaluation, as well as ongoing medical and medication management to treat the patient's condition.  The patient has been placed under full IVC at this time.       FINAL CLINICAL IMPRESSION(S) / ED DIAGNOSES   Final diagnoses:  Suicidal ideation  Schizophrenia, unspecified type (HCC)     Rx / DC Orders   ED Discharge Orders     None        Note:  This document was prepared using Dragon voice recognition software and may include unintentional dictation errors.   Chesley Noon, MD 11/04/22 1109

## 2022-11-04 NOTE — Progress Notes (Signed)
The patient arrived to the BMU, accompanied by security and carrying IVC documentation.

## 2022-11-04 NOTE — BH Assessment (Signed)
Comprehensive Clinical Assessment (CCA) Note  11/04/2022 [place] [id number]  Chief Complaint:  Chief Complaint  Patient presents with   Suicidal   Visit Diagnosis: Schizophrenia   Ronald [name] is a 27 year old male who presents to the ER because he reported to his mother he was having thoughts of ending his life. Patient is well known to the ER for similar presentation and for psychosis from not taking his medications. During the interview, the patient was guarded and provided limited information about what was going on. However, due to history and knowledge of the patient, he appeared to be responding to internal stimuli. He wouldn't not share whether or not he has used illicit drugs. He wouldn't share he whether or not he was taking his medications.  CCA Screening, Triage and Referral (STR)  Patient Reported Information How did you hear about [place]? Family/Friend  What Is the Reason for Your Visit/Call Today? Patient voicing SI, to his mother.  How Long Has This Been Causing You Problems? 1 wk - 1 month  What Do You Feel Would Help You the Most Today? Treatment for Depression or other mood problem; Alcohol or Drug Use Treatment   Have You Recently Had Any Thoughts About Hurting Yourself? Yes  Are You Planning to Commit Suicide/Harm Yourself At This time? No   Flowsheet Row ED from 11/04/2022 in [facility] at [facility] Most recent reading at 11/04/2022 12:49 PM ED from 08/29/2022 in [facility] at [facility] Most recent reading at 08/29/2022  8:06 AM ED from 08/29/2022 in [facility] Department at [facility] Most recent reading at 08/29/2022  7:12 AM  C-SSRS RISK CATEGORY No Risk No Risk No Risk       Have you Recently Had Thoughts About Hurting Someone Karolee Ohs? No  Are You Planning to Harm Someone at This Time? No  Explanation: No data recorded  Have You Used Any Alcohol or Drugs in the Past 24 Hours?  No  What Did You Use and How Much? Patient denies use   Do You Currently Have a Therapist/Psychiatrist? Yes  Name of Therapist/Psychiatrist:    Have You Been Recently Discharged From Any Office Practice or Programs? No  Explanation of Discharge From Practice/Program: No data recorded    CCA Screening Triage Referral Assessment Type of Contact: Face-to-Face  Telemedicine Service Delivery:   Is this Initial or Reassessment?   Date Telepsych consult ordered in [facility]:    Time Telepsych consult ordered in [facility]:    Location of Assessment: [facility] ED  Provider Location: [facility] ED   Collateral Involvement: [name] (Mother)   [phone]   Does Patient Have a Court Appointed Legal Guardian? No  Legal Guardian Contact Information: No data recorded Copy of Legal Guardianship Form: No data recorded Legal Guardian Notified of Arrival: No data recorded Legal Guardian Notified of Pending Discharge: No data recorded If Minor and Not Living with Parent(s), Who has Custody? n/a  Is CPS involved or ever been involved? Never  Is APS involved or ever been involved? Never   Patient Determined To Be At Risk for Harm To Self or Others Based on Review of Patient Reported Information or Presenting Complaint? Yes, for Self-Harm  Method: No data recorded Availability of Means: No data recorded Intent: No data recorded Notification Required: No data recorded Additional Information for Danger to Others Potential: No data recorded Additional Comments for Danger to Others Potential: No data recorded Are There Guns or Other Weapons in Your Home?  No  Types of Guns/Weapons: No data recorded Are These Weapons Safely Secured?                            No  Who Could Verify You Are Able To Have These Secured: No data recorded Do You Have any Outstanding Charges, Pending Court Dates, Parole/Probation? No data recorded Contacted To Inform of Risk of Harm To Self or Others: No data  recorded   Does Patient Present under Involuntary Commitment? Yes    [place] of Residence: [place]   Patient Currently Receiving the Following Services: Medication Management; ACTT (Assertive Community Treatment)   Determination of Need: Emergent (2 hours)   Options For Referral: Inpatient Hospitalization     CCA Biopsychosocial Patient Reported Schizophrenia/Schizoaffective Diagnosis in Past: Yes   Strengths: Have a support system, have some insight, and pleasant.   Mental Health Symptoms Depression:   Difficulty Concentrating; Sleep (too much or little)   Duration of Depressive symptoms:  Duration of Depressive Symptoms: Greater than two weeks   Mania:   Recklessness; Increased Energy   Anxiety:    Sleep; Restlessness   Psychosis:   Grossly disorganized speech   Duration of Psychotic symptoms:  Duration of Psychotic Symptoms: Less than six months   Trauma:   N/A   Obsessions:   N/A   Compulsions:   N/A   Inattention:   N/A   Hyperactivity/Impulsivity:   N/A   Oppositional/Defiant Behaviors:   N/A   Emotional Irregularity:   N/A   Other Mood/Personality Symptoms:   n/a    Mental Status Exam Appearance and self-care  Stature:   Average   Weight:   Average weight   Clothing:   Neat/clean; Age-appropriate   Grooming:   Normal   Cosmetic use:   None   Posture/gait:   Normal   Motor activity:   -- (Within normal range.)   Sensorium  Attention:   Distractible   Concentration:   Preoccupied   Orientation:   X5   Recall/memory:   Normal   Affect and Mood  Affect:   Appropriate   Mood:   Depressed   Relating  Eye contact:   None   Facial expression:   -- (Patient had his eyes closed.)   Attitude toward examiner:   Guarded   Thought and Language  Speech flow:  Normal   Thought content:   Appropriate to Mood and Circumstances   Preoccupation:   None   Hallucinations:   None (Reports of  none)   Organization:   Development worker, international aid of Knowledge:   Fair   Intelligence:   Average   Abstraction:   Abstract   Judgement:   Impaired   Reality Testing:   Distorted   Insight:   Fair   Decision Making:   Impulsive   Social Functioning  Social Maturity:   Impulsive   Social Judgement:   Chemical engineer"; [place]   Stress  Stressors:   Other (Comment); Transitions   Coping Ability:   Exhausted   Skill Deficits:   Decision making; Interpersonal   Supports:   Family     Religion:    Leisure/Recreation: Leisure / Recreation Do You Have Hobbies?: No  Exercise/Diet: Exercise/Diet Do You Exercise?: No Have You Gained or Lost A Significant Amount of Weight in the Past Six Months?: No Do You Follow a Special Diet?: No Do You Have Any Trouble Sleeping?: Yes  CCA Employment/Education Employment/Work Situation: Employment / Work Situation Employment Situation: Unemployed Has Patient ever Been in Equities trader?: No  Education: Education Is Patient Currently Attending School?: No Did Theme park manager?: No Did You Have An Individualized Education Program (IIEP): No Did You Have Any Difficulty At [employer]?: No Patient's Education Has Been Impacted by Current Illness: No   CCA Family/Childhood History Family and Relationship History: Family history Marital status: Single Does patient have children?: No  Childhood History:  Childhood History By whom was/is the patient raised?: Mother Did patient suffer any verbal/emotional/physical/sexual abuse as a child?: No Did patient suffer from severe childhood neglect?: No Has patient ever been sexually abused/assaulted/raped as an adolescent or adult?: No Was the patient ever a victim of a crime or a disaster?: No Witnessed domestic violence?: No Has patient been affected by domestic violence as an adult?: No  CCA Substance Use Alcohol/Drug Use: Alcohol / Drug Use Pain  Medications: See PTA Prescriptions: See PTA Over the Counter: See PTA History of alcohol / drug use?: Yes Substance #1 Name of Substance 1: Methamphetamine Substance #2 Name of Substance 2: Cannabis Substance #3 Name of Substance 3: Alcohol    ASAM's:  Six Dimensions of Multidimensional Assessment  Dimension 1:  Acute Intoxication and/or Withdrawal Potential:      Dimension 2:  Biomedical Conditions and Complications:      Dimension 3:  Emotional, Behavioral, or Cognitive Conditions and Complications:     Dimension 4:  Readiness to Change:     Dimension 5:  Relapse, Continued use, or Continued Problem Potential:     Dimension 6:  Recovery/Living Environment:     ASAM Severity Score:    ASAM Recommended Level of Treatment:     Substance use Disorder (SUD)    Recommendations for Services/Supports/Treatments:    Discharge Disposition:    DSM5 Diagnoses: Patient Active Problem List   Diagnosis Date Noted   Schizophrenia (HCC) 11/02/2021   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

## 2022-11-04 NOTE — ED Notes (Signed)
Report called to BMU.

## 2022-11-04 NOTE — Consult Note (Cosign Needed Addendum)
[facility] Face-to-Face Psychiatry Consult   Reason for Consult:  bizarre behaviors, substance use, noncompliance Referring Physician:  EDP Patient Identification: Peder Scarpati MRN:  [id number] Principal Diagnosis: Methamphetamine-induced psychotic disorder (HCC) Diagnosis:  Principal Problem:   Methamphetamine-induced psychotic disorder (HCC)   Total Time spent with patient: 45 minutes  Subjective:   Kamran Sanguino is a 27 y.o. male patient admitted with psychosis, meth abuse, and noncompliance of medications.  HPI:  27 yo male presented to the ED with psychosis after binging on meth and not taking his medications.  On assessment, he forwards a few words only.  His mother was here earlier and reported he went missing for 5 days, not taking his medications and using substances.    Per Dr Larinda Buttery who spoke to his mother: Draven Degood is a 27 y.o. male with past medical history of schizophrenia and polysubstance abuse who presents to the ED for psychiatric evaluation.  Mother reports that patient left the house last week and was not heard from for about 5 days.  She is concerned that he went on a drug binge and states he has not been taking any of his medications in the past few days.  When he returned to the home, mother found patient to be acting erratically and making suicidal statements.  He apparently stated that he did not want to be around anymore and wanted to go to sleep forever.  Patient currently denies any suicidal or homicidal ideation, also denies any drug use.  He denies any medical complaints.   Past Psychiatric History: schizophrenia, polysubstance abuse  Risk to Self:  yes Risk to Others:  none Prior Inpatient Therapy:  multiple times Prior Outpatient Therapy:  unknown  Past Medical History:  Past Medical History:  Diagnosis Date   Asthma    No past surgical history on file. Family History: No family history on file. Family Psychiatric  History: none Social History:   Social History   Substance and Sexual Activity  Alcohol Use Yes   Alcohol/week: 2.0 standard drinks of alcohol   Types: 2 Cans of beer per week   Comment: unable to determine quantity/frequency     Social History   Substance and Sexual Activity  Drug Use Yes   Types: Marijuana, Methamphetamines   Comment: unable to determine quantity/frequency    Social History   Socioeconomic History   Marital status: Single    Spouse name: Not on file   Number of children: Not on file   Years of education: Not on file   Highest education level: Not on file  Occupational History   Not on file  Tobacco Use   Smoking status: Never   Smokeless tobacco: Never  Vaping Use   Vaping Use: Never used  Substance and Sexual Activity   Alcohol use: Yes    Alcohol/week: 2.0 standard drinks of alcohol    Types: 2 Cans of beer per week    Comment: unable to determine quantity/frequency   Drug use: Yes    Types: Marijuana, Methamphetamines    Comment: unable to determine quantity/frequency   Sexual activity: Yes    Partners: Female    Birth control/protection: Condom  Other Topics Concern   Not on file  Social History Narrative   ** Merged History Encounter **       ** Merged History Encounter **       Social Determinants of Health   Financial Resource Strain: Not on file  Food Insecurity: No Food Insecurity (06/22/2022)  Hunger Vital Sign    Worried About Running Out of Food in the Last Year: Never true    Ran Out of Food in the Last Year: Never true  Recent Concern: Food Insecurity - Food Insecurity Present (06/18/2022)   Hunger Vital Sign    Worried About Running Out of Food in the Last Year: Often true    Ran Out of Food in the Last Year: Often true  Transportation Needs: No Transportation Needs (06/22/2022)   PRAPARE - Administrator, Civil Service (Medical): No    Lack of Transportation (Non-Medical): No  Physical Activity: Not on file  Stress: Not on file  Social  Connections: Not on file   Additional Social History:    Allergies:   Allergies  Allergen Reactions   Pork-Derived Products Other (See Comments)    Does not eat    Labs:  Results for orders placed or performed during the hospital encounter of 11/04/22 (from the past 48 hour(s))  Comprehensive metabolic panel     Status: Abnormal   Collection Time: 11/04/22 10:02 AM  Result Value Ref Range   Sodium 136 135 - 145 mmol/L   Potassium 3.4 (L) 3.5 - 5.1 mmol/L   Chloride 102 98 - 111 mmol/L   CO2 22 22 - 32 mmol/L   Glucose, Bld 168 (H) 70 - 99 mg/dL    Comment: Glucose reference range applies only to samples taken after fasting for at least 8 hours.   BUN 28 (H) 6 - 20 mg/dL   Creatinine, Ser 5.36 0.61 - 1.24 mg/dL   Calcium 9.4 8.9 - 64.4 mg/dL   Total Protein 8.1 6.5 - 8.1 g/dL   Albumin 4.2 3.5 - 5.0 g/dL   AST 42 (H) 15 - 41 U/L   ALT 15 0 - 44 U/L   Alkaline Phosphatase 48 38 - 126 U/L   Total Bilirubin 0.8 0.3 - 1.2 mg/dL   GFR, Estimated >03 >47 mL/min    Comment: (NOTE) Calculated using the CKD-EPI Creatinine Equation (2021)    Anion gap 12 5 - 15    Comment: Performed at [facility], [address] [place], [place] [address]  Ethanol     Status: None   Collection Time: 11/04/22 10:02 AM  Result Value Ref Range   Alcohol, Ethyl (B) <10 <10 mg/dL    Comment: (NOTE) Lowest detectable limit for serum alcohol is 10 mg/dL.  For medical purposes only. Performed at [facility], [address] [place], [place] [address]   Salicylate level     Status: Abnormal   Collection Time: 11/04/22 10:02 AM  Result Value Ref Range   Salicylate Lvl <7.0 (L) 7.0 - 30.0 mg/dL    Comment: Performed at [facility], [address] [place], [place] [address]  Acetaminophen level     Status: Abnormal   Collection Time: 11/04/22 10:02 AM  Result Value Ref Range   Acetaminophen (Tylenol), Serum <10 (L) 10 - 30 ug/mL    Comment: (NOTE) Therapeutic  concentrations vary significantly. A range of 10-30 ug/mL  may be an effective concentration for many patients. However, some  are best treated at concentrations outside of this range. Acetaminophen concentrations >150 ug/mL at 4 hours after ingestion  and >50 ug/mL at 12 hours after ingestion are often associated with  toxic reactions.  Performed at [facility], [address] [place], [place] [address]   cbc     Status: None  Collection Time: 11/04/22 10:02 AM  Result Value Ref Range   WBC 7.2 4.0 - 10.5 K/uL   RBC 5.43 4.22 - 5.81 MIL/uL   Hemoglobin 16.1 13.0 - 17.0 g/dL   HCT 40.9 81.1 - 91.4 %   MCV 90.1 80.0 - 100.0 fL   MCH 29.7 26.0 - 34.0 pg   MCHC 32.9 30.0 - 36.0 g/dL   RDW 78.2 95.6 - 21.3 %   Platelets 385 150 - 400 K/uL   nRBC 0.0 0.0 - 0.2 %    Comment: Performed at [facility], [address] [place], [place] [address]    No current facility-administered medications for this encounter.   Current Outpatient Medications  Medication Sig Dispense Refill   benztropine (COGENTIN) 1 MG tablet Take 1 tablet (1 mg total) by mouth at bedtime. 15 tablet 0   nicotine polacrilex (NICORETTE) 2 MG gum Chew 1 each (2 mg total) by mouth as needed for smoking cessation. 50 tablet 0   risperiDONE (RISPERDAL) 2 MG tablet Take 2 tablets (4 mg total) by mouth at bedtime. 30 tablet 0   traZODone (DESYREL) 50 MG tablet Take 1 tablet (50 mg total) by mouth at bedtime. 15 tablet 0    Musculoskeletal: Strength & Muscle Tone: within normal limits Gait & Station: normal Patient leans: N/A  Psychiatric Specialty Exam: Physical Exam Vitals and nursing note reviewed.  Constitutional:      Appearance: Normal appearance.  HENT:     Head: Normocephalic.     Nose: Nose normal.  Pulmonary:     Effort: Pulmonary effort is normal.  Musculoskeletal:        General: Normal range of motion.     Cervical back: Normal range of motion.  Neurological:     General:  No focal deficit present.     Mental Status: He is alert.  Psychiatric:        Attention and Perception: Attention and perception normal.        Mood and Affect: Mood is depressed.        Speech: Speech normal.        Behavior: Behavior is slowed.        Thought Content: Thought content includes suicidal ideation.        Cognition and Memory: Cognition is impaired.        Judgment: Judgment is inappropriate.     Review of Systems  Psychiatric/Behavioral:  Positive for depression, hallucinations and substance abuse.   All other systems reviewed and are negative.   Blood pressure 95/73, pulse (!) 103, temperature 98.2 F (36.8 C), temperature source Oral, resp. rate 18, height 5\' 11"  (1.803 m), SpO2 98 %.Body mass index is 21.52 kg/m.  General Appearance: Disheveled  Eye Contact:  Minimal  Speech:  Slow  Volume:  Decreased  Mood:  Depressed  Affect:  Congruent  Thought Process:  UTA  Orientation:  UTA  Thought Content:  UTA  Suicidal Thoughts:  Yes.  without intent/plan  Homicidal Thoughts:  No  Memory:  UTA  Judgement:  Impaired  Insight:  UTA  Psychomotor Activity:  Decreased  Concentration:  Concentration: Poor and Attention Span: Poor  Recall:  Poor  Fund of Knowledge:  UTA  Language:  Poor  Akathisia:  No  Handed:  Right  AIMS (if indicated):     Assets:  Housing Leisure Time Resilience Social Support  ADL's:  Intact  Cognition:  Impaired,  Moderate  Sleep:  Physical Exam: Physical Exam Vitals and nursing note reviewed.  Constitutional:      Appearance: Normal appearance.  HENT:     Head: Normocephalic.     Nose: Nose normal.  Pulmonary:     Effort: Pulmonary effort is normal.  Musculoskeletal:        General: Normal range of motion.     Cervical back: Normal range of motion.  Neurological:     General: No focal deficit present.     Mental Status: He is alert.  Psychiatric:        Attention and Perception: Attention and perception normal.         Mood and Affect: Mood is depressed.        Speech: Speech normal.        Behavior: Behavior is slowed.        Thought Content: Thought content includes suicidal ideation.        Cognition and Memory: Cognition is impaired.        Judgment: Judgment is inappropriate.    Review of Systems  Psychiatric/Behavioral:  Positive for depression, hallucinations and substance abuse.   All other systems reviewed and are negative.  Blood pressure 95/73, pulse (!) 103, temperature 98.2 F (36.8 C), temperature source Oral, resp. rate 18, height 5\' 11"  (1.803 m), SpO2 98 %. Body mass index is 21.52 kg/m.  Treatment Plan Summary: Daily contact with patient to assess and evaluate symptoms and progress in treatment, Medication management, and Plan : Methamphetamine abuse with methamphetamine induced psychosis: Risperdal 4 mg at bedtime Gabapentin 300 mg TID started  Insomnia: Trazodone 50 mg daily at bedtime  EPS: Cogentin 1 mg at bedtime  Disposition: Recommend psychiatric Inpatient admission when medically cleared.  [name], NP 11/04/2022 12:10 PM

## 2022-11-04 NOTE — Progress Notes (Signed)
Elevated AIMS likely due to substance use.

## 2022-11-04 NOTE — ED Triage Notes (Signed)
Pt here with SI. When asking pt what is going on today, pt states "nothing". Mother in room states pt has been saying that he does not want to be here anymore. Pt has not been compliant with medication. Pt pacing in triage.

## 2022-11-04 NOTE — Progress Notes (Signed)
Room 310 prepared for patient's arrival.

## 2022-11-04 NOTE — Progress Notes (Signed)
The patient arrived to the BMU, accompanied by security, and carrying signed IVC documentation.  Vitals were taken, a skin search was conducted, and belongings were secured.  Vitals were stable and there was no visible evidence of skin abnormalities.  All of the patient's belongings were placed in a locker.  Ronald [name] presented as silly and disinterested.  He endorsed recent methamphetamine use and was observed as having difficulties remaining focused.  The patient was fidgety with pressured speech.  He denied suicidal and homicidal ideations.  He did not appear to be responding to internal stimuli, but his insight was very poor.  Ronald [name] arrived requesting "help with getting some stability in life."  He stated that he would remain safe while admitted.  Room 310 provided, along with toiletries and a snack.  15-minute safety checks initiated.

## 2022-11-04 NOTE — Progress Notes (Signed)
Report received from ED. CCA printed.

## 2022-11-04 NOTE — Tx Team (Signed)
Initial Treatment Plan 11/04/2022 11:05 PM [name] ZOX:[id number]    PATIENT STRESSORS: Marital or family conflict   Medication change or noncompliance   Substance abuse     PATIENT STRENGTHS: Active sense of humor  General fund of knowledge    PATIENT IDENTIFIED PROBLEMS: "I need to get some stability."                     DISCHARGE CRITERIA:  Adequate post-discharge living arrangements Improved stabilization in mood, thinking, and/or behavior Motivation to continue treatment in a less acute level of care  PRELIMINARY DISCHARGE PLAN: Outpatient therapy  PATIENT/FAMILY INVOLVEMENT: This treatment plan has been presented to and reviewed with the patient, Ronald [name].  The patient and family have been given the opportunity to ask questions and make suggestions.  Virgina Organ, RN 11/04/2022, 11:05 PM

## 2022-11-04 NOTE — BH Assessment (Signed)
Patient is to be admitted to [facility] by Psychiatric Nurse Practitioner [name].  Attending Physician will be Dr.  Toni Amend .   Patient has been assigned to room 310, by [facility] Charge Nurse Lorenda Hatchet R.   Bed ready after 20:00.

## 2022-11-04 NOTE — ED Notes (Addendum)
Pt belongings:   1 brown shirt 1 pair of jeans 1 black belt 1 pair of yellow and grey sneakers 1 pair of white socks

## 2022-11-04 NOTE — ED Notes (Signed)
Pt sleeping, dinner tray within reach.

## 2022-11-04 NOTE — Progress Notes (Signed)
Patient has a history of aggressive behaviors.

## 2022-11-05 DIAGNOSIS — F203 Undifferentiated schizophrenia: Secondary | ICD-10-CM

## 2022-11-05 NOTE — BHH Counselor (Signed)
CSW attempted to meet with pt for completion of the PSA. CSW knocked on the door without answer. After this CSW cracked door and called pt by name without response. Pt appeared to be soundly sleeping and in no acute distress. CSW will attempt PSA at a later date.   Ronald [name]. Algis Greenhouse, MSW, LCSW, LCAS 11/05/2022 3:14 PM

## 2022-11-05 NOTE — H&P (Signed)
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  Patient known from previous encounters.  27 year old man with schizophrenia and substance abuse history.  Brought to the hospital by family with reports that he had disappeared for a few days and when he came back was acting bizarrely with poor self-care.  I attempted to speak with him today.  He woke up and rubbed his eyes and tried to speak but all he could manage was to mumble a few sounds.  I ask him several times gently to repeat himself and he could not say anything coherent and then he went back to sleep.  No drug screen was done in the emergency room so we do not know if he has been using methamphetamine. Associated Signs/Symptoms: Depression Symptoms:  psychomotor retardation, (Hypo) Manic Symptoms:  Impulsivity, Anxiety Symptoms:   None reported Psychotic Symptoms:   Bizarre behavior PTSD Symptoms: Negative Total Time spent with patient: 30 minutes  Past Psychiatric History: Patient has a long history of similar presentations.  He has a diagnosis of schizophrenia in addition to which she has a history of methamphetamine abuse.  Frequently presents to the hospital having been using drugs although not every time.  When he is on antipsychotics does reasonably well but tends to refuse long-acting injectables.  Has had several hospitalizations of a similar pattern.  He has no history of suicide attempts or violence.  Is the patient at risk to self? Yes.    Has the patient been a risk to self in the past 6 months? Yes.    Has the patient been a risk to self within the distant past? Yes.    Is the patient a risk to others? No.  Has  the patient been a risk to others in the past 6 months? No.  Has the patient been a risk to others within the distant past? No.   [place] Scale:  Flowsheet Row Admission (Current) from 11/04/2022 in [facility] INPATIENT BEHAVIORAL MEDICINE Most recent reading at 11/04/2022  9:00 PM ED from 11/04/2022 in [facility] Emergency Department at [facility] Most recent reading at 11/04/2022 12:49 PM ED from 08/29/2022 in [facility] Emergency Department at [facility] Most recent reading at 08/29/2022  8:06 AM  C-SSRS RISK CATEGORY No Risk No Risk No Risk        Prior Inpatient Therapy: Yes.   If yes, describe most recently a few months ago Prior Outpatient Therapy: Yes.   If yes, describe frequently referred to treatment although unclear how much he really follows up.  Alcohol Screening: Patient refused Alcohol Screening Tool:  (Pt denied all alcohol use.) 1. How often do you have a drink containing alcohol?: Never 2. How many drinks containing alcohol do you have on a typical day when you are drinking?: 1 or 2 3. How often do you have six or more drinks on one occasion?: Never AUDIT-C Score: 0 4. How often during the last year have you found that you were not able to stop drinking once you had started?: Never 5. How often during the last year have you failed to do what was normally expected from you because of drinking?: Never 6. How often during the last year have you needed a first drink in the morning to get  yourself going after a heavy drinking session?: Never 7. How often during the last year have you had a feeling of guilt of remorse after drinking?: Never 8. How often during the last year have you been unable to remember what happened the night before because you had been drinking?: Never 9. Have you or someone else been injured as a result of your drinking?: No 10. Has a relative or friend or a doctor or another health worker been concerned about your drinking or suggested you cut down?:  No Alcohol Use Disorder Identification Test Final Score (AUDIT): 0 Alcohol Brief Interventions/Follow-up: Patient Refused Substance Abuse History in the last 12 months:  Yes.   Consequences of Substance Abuse: Amphetamines clearly a main driver to acute problems Previous Psychotropic Medications: Yes  Psychological Evaluations: Yes  Past Medical History:  Past Medical History:  Diagnosis Date   Asthma    History reviewed. No pertinent surgical history. Family History: History reviewed. No pertinent family history. Family Psychiatric  History: None reported Tobacco Screening:  Social History   Tobacco Use  Smoking Status Every Day   Packs/day: .5   Types: Cigarettes  Smokeless Tobacco Current  Tobacco Comments   Patient endorsed smoking cigarettes after denying earlier in the day.      BH Tobacco Counseling     Are you interested in Tobacco Cessation Medications?  Yes, implement Nicotene Replacement Protocol Counseled patient on smoking cessation:  Yes Reason Tobacco Screening Not Completed: Patient Refused Screening       Social History:  Social History   Substance and Sexual Activity  Alcohol Use Yes   Alcohol/week: 2.0 standard drinks of alcohol   Types: 2 Cans of beer per week   Comment: unable to determine quantity/frequency     Social History   Substance and Sexual Activity  Drug Use Yes   Types: Marijuana, Methamphetamines   Comment: unable to determine quantity/frequency    Additional Social History:                           Allergies:   Allergies  Allergen Reactions   Pork-Derived Products Other (See Comments)    Does not eat   Lab Results:  Results for orders placed or performed during the hospital encounter of 11/04/22 (from the past 48 hour(s))  Comprehensive metabolic panel     Status: Abnormal   Collection Time: 11/04/22 10:02 AM  Result Value Ref Range   Sodium 136 135 - 145 mmol/L   Potassium 3.4 (L) 3.5 - 5.1 mmol/L    Chloride 102 98 - 111 mmol/L   CO2 22 22 - 32 mmol/L   Glucose, Bld 168 (H) 70 - 99 mg/dL    Comment: Glucose reference range applies only to samples taken after fasting for at least 8 hours.   BUN 28 (H) 6 - 20 mg/dL   Creatinine, Ser 1.61 0.61 - 1.24 mg/dL   Calcium 9.4 8.9 - 09.6 mg/dL   Total Protein 8.1 6.5 - 8.1 g/dL   Albumin 4.2 3.5 - 5.0 g/dL   AST 42 (H) 15 - 41 U/L   ALT 15 0 - 44 U/L   Alkaline Phosphatase 48 38 - 126 U/L   Total Bilirubin 0.8 0.3 - 1.2 mg/dL   GFR, Estimated >04 >54 mL/min    Comment: (NOTE) Calculated using the CKD-EPI Creatinine Equation (2021)    Anion gap 12 5 - 15    Comment: Performed at [employer]  [facility], [address], [place], [place] [address]  Ethanol     Status: None   Collection Time: 11/04/22 10:02 AM  Result Value Ref Range   Alcohol, Ethyl (B) <10 <10 mg/dL    Comment: (NOTE) Lowest detectable limit for serum alcohol is 10 mg/dL.  For medical purposes only. Performed at [facility], [address] [place], [place] [address]   Salicylate level     Status: Abnormal   Collection Time: 11/04/22 10:02 AM  Result Value Ref Range   Salicylate Lvl <7.0 (L) 7.0 - 30.0 mg/dL    Comment: Performed at [facility], [address] [place], [place] [address]  Acetaminophen level     Status: Abnormal   Collection Time: 11/04/22 10:02 AM  Result Value Ref Range   Acetaminophen (Tylenol), Serum <10 (L) 10 - 30 ug/mL    Comment: (NOTE) Therapeutic concentrations vary significantly. A range of 10-30 ug/mL  may be an effective concentration for many patients. However, some  are best treated at concentrations outside of this range. Acetaminophen concentrations >150 ug/mL at 4 hours after ingestion  and >50 ug/mL at 12 hours after ingestion are often associated with  toxic reactions.  Performed at [facility], [address] [place], [place] [address]   cbc     Status: None   Collection Time: 11/04/22  10:02 AM  Result Value Ref Range   WBC 7.2 4.0 - 10.5 K/uL   RBC 5.43 4.22 - 5.81 MIL/uL   Hemoglobin 16.1 13.0 - 17.0 g/dL   HCT 29.5 62.1 - 30.8 %   MCV 90.1 80.0 - 100.0 fL   MCH 29.7 26.0 - 34.0 pg   MCHC 32.9 30.0 - 36.0 g/dL   RDW 65.7 84.6 - 96.2 %   Platelets 385 150 - 400 K/uL   nRBC 0.0 0.0 - 0.2 %    Comment: Performed at [name], [address], [place], [place] [address]    Blood Alcohol level:  Lab Results  Component Value Date   [facility] <10 11/04/2022   ETH <10 08/29/2022    Metabolic Disorder Labs:  Lab Results  Component Value Date   HGBA1C 5.1 11/02/2021   MPG 99.67 11/02/2021   Lab Results  Component Value Date   PROLACTIN 22.8 (H) 05/08/2019   Lab Results  Component Value Date   CHOL 141 11/02/2021   TRIG 93 11/02/2021   HDL 69 11/02/2021   CHOLHDL 2.0 11/02/2021   VLDL 19 11/02/2021   LDLCALC 53 11/02/2021   LDLCALC 56 05/08/2019    Current Medications: Current Facility-Administered Medications  Medication Dose Route Frequency Provider Last Rate Last Admin   acetaminophen (TYLENOL) tablet 650 mg  650 mg Oral Q6H PRN Charm Rings, NP       alum & mag hydroxide-simeth (MAALOX/MYLANTA) 200-200-20 MG/5ML suspension 30 mL  30 mL Oral Q4H PRN Charm Rings, NP       benztropine (COGENTIN) tablet 1 mg  1 mg Oral QHS Charm Rings, NP   1 mg at 11/04/22 2132   diphenhydrAMINE (BENADRYL) capsule 50 mg  50 mg Oral BID PRN Charm Rings, NP       Or   diphenhydrAMINE (BENADRYL) injection 50 mg  50 mg Intramuscular BID PRN Charm Rings, NP       gabapentin (NEURONTIN) capsule 300 mg  300 mg Oral TID Charm Rings, NP   300 mg at 11/05/22 1201  haloperidol (HALDOL) tablet 5 mg  5 mg Oral BID PRN Charm Rings, NP       Or   haloperidol lactate (HALDOL) injection 5 mg  5 mg Intramuscular BID PRN Charm Rings, NP       magnesium hydroxide (MILK OF MAGNESIA) suspension 30 mL  30 mL Oral Daily PRN Charm Rings, NP        risperiDONE (RISPERDAL) tablet 4 mg  4 mg Oral QHS Charm Rings, NP   4 mg at 11/04/22 2133   traZODone (DESYREL) tablet 50 mg  50 mg Oral QHS Charm Rings, NP   50 mg at 11/04/22 2132   PTA Medications: Medications Prior to Admission  Medication Sig Dispense Refill Last Dose   benztropine (COGENTIN) 1 MG tablet Take 1 tablet (1 mg total) by mouth at bedtime. (Patient not taking: Reported on 11/04/2022) 15 tablet 0 Not Taking   nicotine polacrilex (NICORETTE) 2 MG gum Chew 1 each (2 mg total) by mouth as needed for smoking cessation. (Patient not taking: Reported on 11/04/2022) 50 tablet 0 Not Taking   risperiDONE (RISPERDAL) 2 MG tablet Take 2 tablets (4 mg total) by mouth at bedtime. (Patient not taking: Reported on 11/04/2022) 30 tablet 0 Not Taking   traZODone (DESYREL) 50 MG tablet Take 1 tablet (50 mg total) by mouth at bedtime. (Patient not taking: Reported on 11/04/2022) 15 tablet 0 Not Taking    Musculoskeletal: Strength & Muscle Tone: within normal limits Gait & Station: unable to stand Patient leans: N/A            Psychiatric Specialty Exam:  Presentation  General Appearance:  Casual  Eye Contact: Fleeting  Speech: Clear and Coherent  Speech Volume: Normal  Handedness: Right   Mood and Affect  Mood: Dysphoric  Affect: Congruent   Thought Process  Thought Processes: Linear  Duration of Psychotic Symptoms: Chronic probably worse in the last week Past Diagnosis of Schizophrenia or Psychoactive disorder: Yes  Descriptions of Associations:Intact  Orientation:Full (Time, Place and Person)  Thought Content:Logical  Hallucinations:No data recorded Ideas of Reference:None  Suicidal Thoughts:No data recorded Homicidal Thoughts:No data recorded  Sensorium  Memory: Immediate Fair; Recent Fair  Judgment: Fair  Insight: Present; Shallow   Executive Functions  Concentration: Fair  Attention Span: Fair  Recall: [employer] of  Knowledge: Fair  Language: Fair   Psychomotor Activity  Psychomotor Activity:No data recorded  Assets  Assets: Physical Health; Resilience   Sleep  Sleep:No data recorded   Physical Exam: Physical Exam Vitals and nursing note reviewed.  Constitutional:      Appearance: Normal appearance.  HENT:     Head: Normocephalic and atraumatic.     Mouth/Throat:     Pharynx: Oropharynx is clear.  Eyes:     Pupils: Pupils are equal, round, and reactive to light.  Cardiovascular:     Rate and Rhythm: Normal rate and regular rhythm.  Pulmonary:     Effort: Pulmonary effort is normal.     Breath sounds: Normal breath sounds.  Abdominal:     General: Abdomen is flat.     Palpations: Abdomen is soft.  Musculoskeletal:        General: Normal range of motion.  Skin:    General: Skin is warm and dry.  Neurological:     General: No focal deficit present.     Mental Status: He is alert. Mental status is at baseline.  Psychiatric:  Attention and Perception: He is inattentive.        Mood and Affect: Mood normal. Affect is blunt.        Speech: He is noncommunicative.    Review of Systems  Unable to perform ROS: Psychiatric disorder   Blood pressure 112/73, pulse (!) 106, temperature 97.6 F (36.4 C), temperature source Oral, resp. rate 17, height 5\' 11"  (1.803 m), weight 81.2 kg, SpO2 100 %. Body mass index is 24.97 kg/m.  Treatment Plan Summary: Medication management and Plan patient has been restarted on his Risperdal.  Labs reviewed.  Engage in individual and group therapy when we can.  Monitor daily for the expected improvement in mental state.  Observation Level/Precautions:  15 minute checks  Laboratory:  Chemistry Profile  Psychotherapy:    Medications:    Consultations:    Discharge Concerns:    Estimated LOS:  Other:     Physician Treatment Plan for Primary Diagnosis: Schizophrenia (HCC) Long Term Goal(s): Improvement in symptoms so as ready for  discharge  Short Term Goals: Ability to identify changes in lifestyle to reduce recurrence of condition will improve and Ability to verbalize feelings will improve  Physician Treatment Plan for Secondary Diagnosis: Principal Problem:   Schizophrenia (HCC)  Long Term Goal(s): Improvement in symptoms so as ready for discharge  Short Term Goals: Compliance with prescribed medications will improve  I certify that inpatient services furnished can reasonably be expected to improve the patient's condition.    Mordecai Rasmussen, MD 6/10/20244:02 PM

## 2022-11-05 NOTE — [facility] Group Notes (Signed)
[facility] Group Notes:  (Nursing/MHT/Case Management/Adjunct)  Date:  11/05/2022  Time:  10:17 AM  Type of Therapy:  Psychoeducational Skills  Participation Level: did not attend.  Participation Quality:   na  Affect:   na  Cognitive:   na  Insight:  None  Engagement in Group:   na  Modes of Intervention:   na  Summary of Progress/Problems:  Malva Limes 11/05/2022, 10:17 AM

## 2022-11-05 NOTE — Progress Notes (Signed)
Patient is alert and oriented X4.  Patient denies SI/HI/AVH. Patient denies pain. Patient is cooperative and compliant with care.  BP 93/51 in the morning and 112/73 when rechecked.  Patient with a slightly unsteady gait when ambulating in the hallway.  Patient states he is just tired and sleepy.  He denies dizziness. No issues with disorientation.  Post lunch patient went to his room to nap. Patient continues on Q15 minute checks.

## 2022-11-05 NOTE — BH IP Treatment Plan (Signed)
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[facility])  Secondary Diagnoses: Principal Problem:   Methamphetamine-induced psychotic disorder (HCC)   Current Medications:  Current Facility-Administered Medications  Medication Dose Route Frequency Provider Last Rate Last Admin   acetaminophen (TYLENOL) tablet 650 mg  650 mg Oral Q6H PRN Charm Rings, NP       alum & mag hydroxide-simeth (MAALOX/MYLANTA) 200-200-20 MG/5ML suspension 30 mL  30 mL Oral Q4H PRN Charm Rings, NP       benztropine (COGENTIN) tablet 1 mg  1 mg Oral QHS Charm Rings, NP   1 mg at 11/04/22 2132   diphenhydrAMINE (BENADRYL) capsule 50 mg  50 mg Oral BID PRN Charm Rings, NP       Or   diphenhydrAMINE (BENADRYL) injection 50 mg  50 mg Intramuscular BID PRN Charm Rings, NP       gabapentin (NEURONTIN) capsule 300 mg  300 mg Oral TID Charm Rings, NP   300 mg at 11/05/22 0859   haloperidol (HALDOL) tablet 5 mg  5 mg Oral BID PRN Charm Rings, NP       Or   haloperidol lactate (HALDOL) injection 5 mg  5 mg Intramuscular BID PRN Charm Rings, NP       magnesium hydroxide (MILK OF MAGNESIA) suspension 30 mL  30 mL Oral Daily PRN Charm Rings, NP       risperiDONE (RISPERDAL) tablet 4 mg  4 mg Oral QHS Charm Rings, NP   4 mg at 11/04/22 2133   traZODone (DESYREL) tablet 50 mg  50 mg Oral QHS Charm Rings, NP   50 mg at 11/04/22 2132   PTA Medications: Medications Prior to Admission  Medication Sig Dispense Refill Last Dose   benztropine (COGENTIN) 1 MG tablet Take 1 tablet (1 mg total) by mouth at bedtime. (Patient not taking: Reported on 11/04/2022) 15 tablet 0 Not Taking   nicotine polacrilex (NICORETTE) 2 MG gum Chew 1 each (2 mg total) by mouth as needed for smoking cessation. (Patient not taking: Reported on 11/04/2022) 50 tablet 0 Not Taking    risperiDONE (RISPERDAL) 2 MG tablet Take 2 tablets (4 mg total) by mouth at bedtime. (Patient not taking: Reported on 11/04/2022) 30 tablet 0 Not Taking   traZODone (DESYREL) 50 MG tablet Take 1 tablet (50 mg total) by mouth at bedtime. (Patient not taking: Reported on 11/04/2022) 15 tablet 0 Not Taking    Patient Stressors: Marital or family conflict   Medication change or noncompliance   Substance abuse    Patient Strengths: Active sense of humor  General fund of knowledge   Treatment Modalities: Medication Management, Group therapy, Case management,  1 to 1 session with clinician, Psychoeducation, Recreational therapy.   Physician Treatment Plan for Primary Diagnosis: Methamphetamine-induced psychotic disorder (HCC) Long Term Goal(s):     Short Term Goals:    Medication Management: Evaluate patient's response, side effects, and tolerance of medication regimen.  Therapeutic Interventions: 1 to 1 sessions, Unit Group sessions and Medication administration.  Evaluation of Outcomes: Not Met  Physician Treatment Plan for Secondary Diagnosis: Principal Problem:   Methamphetamine-induced psychotic disorder (HCC)  Long Term Goal(s):     Short Term Goals:       Medication Management: Evaluate patient's response, side effects, and tolerance of medication regimen.  Therapeutic Interventions: 1 to 1 sessions, Unit Group  sessions and Medication administration.  Evaluation of Outcomes: Not Met   RN Treatment Plan for Primary Diagnosis: Methamphetamine-induced psychotic disorder (HCC) Long Term Goal(s): Knowledge of disease and therapeutic regimen to maintain health will improve  Short Term Goals: Ability to demonstrate self-control, Ability to participate in decision making will improve, Ability to verbalize feelings will improve, Ability to disclose and discuss suicidal ideas, Ability to identify and develop effective coping behaviors will improve, and Compliance with prescribed  medications will improve  Medication Management: RN will administer medications as ordered by provider, will assess and evaluate patient's response and provide education to patient for prescribed medication. RN will report any adverse and/or side effects to prescribing provider.  Therapeutic Interventions: 1 on 1 counseling sessions, Psychoeducation, Medication administration, Evaluate responses to treatment, Monitor vital signs and CBGs as ordered, Perform/monitor CIWA, COWS, AIMS and Fall Risk screenings as ordered, Perform wound care treatments as ordered.  Evaluation of Outcomes: Not Met   LCSW Treatment Plan for Primary Diagnosis: Methamphetamine-induced psychotic disorder (HCC) Long Term Goal(s): Safe transition to appropriate next level of care at discharge, Engage patient in therapeutic group addressing interpersonal concerns.  Short Term Goals: Engage patient in aftercare planning with referrals and resources, Increase social support, Increase ability to appropriately verbalize feelings, Increase emotional regulation, Facilitate acceptance of mental health diagnosis and concerns, Facilitate patient progression through stages of change regarding substance use diagnoses and concerns, Identify triggers associated with mental health/substance abuse issues, and Increase skills for wellness and recovery  Therapeutic Interventions: Assess for all discharge needs, 1 to 1 time with Social worker, Explore available resources and support systems, Assess for adequacy in community support network, Educate family and significant other(s) on suicide prevention, Complete Psychosocial Assessment, Interpersonal group therapy.  Evaluation of Outcomes: Not Met   Progress in Treatment: Attending groups: No. Participating in groups: No. Taking medication as prescribed: Yes. Toleration medication: Yes. Family/Significant other contact made: No, will contact:  once permission is given Patient understands  diagnosis: No. Discussing patient identified problems/goals with staff: No. Medical problems stabilized or resolved: Yes. Denies suicidal/homicidal ideation: Yes. Issues/concerns per patient self-inventory: No. Other: none  New problem(s) identified: No, Describe:  none  New Short Term/Long Term Goal(s): detox, elimination of symptoms of psychosis, medication management for mood stabilization; elimination of SI thoughts; development of comprehensive mental wellness/sobriety plan.   Patient Goals:  Patient declined to attend treatment team, though was given the opportunity.  Discharge Plan or Barriers: CSW to assist in the development of appropriate discharge plans.    Reason for Continuation of Hospitalization: Anxiety Depression Medical Issues Medication stabilization Suicidal ideation Withdrawal symptoms  Estimated Length of Stay:  1-7 days  Last 3 [place] Suicide Severity Risk Score: Flowsheet Row Admission (Current) from 11/04/2022 in [facility] INPATIENT BEHAVIORAL MEDICINE Most recent reading at 11/04/2022  9:00 PM ED from 11/04/2022 in [facility] Emergency Department at [facility] Most recent reading at 11/04/2022 12:49 PM ED from 08/29/2022 in [facility] Emergency Department at [facility] Most recent reading at 08/29/2022  8:06 AM  C-SSRS RISK CATEGORY No Risk No Risk No Risk       Last PHQ 2/9 Scores:     No data to display          Scribe for Treatment Team: Harden Mo, LCSW 11/05/2022 9:13 AM

## 2022-11-05 NOTE — Group Note (Signed)
[facility] LCSW Group Therapy Note    Group Date: 11/05/2022 Start Time: 1300 End Time: 1350  Type of Therapy and Topic:  Group Therapy:  Overcoming Obstacles  Participation Level:  [facility] PARTICIPATION LEVEL: None   Description of Group:   In this group patients will be encouraged to explore what they see as obstacles to their own wellness and recovery. They will be guided to discuss their thoughts, feelings, and behaviors related to these obstacles. The group will process together ways to cope with barriers, with attention given to specific choices patients can make. Each patient will be challenged to identify changes they are motivated to make in order to overcome their obstacles. This group will be process-oriented, with patients participating in exploration of their own experiences as well as giving and receiving support and challenge from other group members.  Therapeutic Goals: 1. Patient will identify personal and current obstacles as they relate to admission. 2. Patient will identify barriers that currently interfere with their wellness or overcoming obstacles.  3. Patient will identify feelings, thought process and behaviors related to these barriers. 4. Patient will identify two changes they are willing to make to overcome these obstacles:    Summary of Patient Progress Patient was present for the majority of the group process. He participated in the icebreaker only because he was called on directly. Outside of the icebreaker, pt did not participate in the discussion.   Therapeutic Modalities:   Cognitive Behavioral Therapy Solution Focused Therapy Motivational Interviewing Relapse Prevention Therapy   Glenis Smoker, LCSW

## 2022-11-05 NOTE — Progress Notes (Signed)
The patient ate a snack, drank juice, and retreated to his room before 2200.  Medications were accepted as ordered.  Krista quickly fell asleep and slept until morning.  Safety checks maintained.

## 2022-11-05 NOTE — Group Note (Signed)
Date:  11/05/2022 Time:  6:36 PM  Group Topic/Focus:  Group Activity      Participation Level:  Did Not Attend  Vinetta Brach A Kashay Cavenaugh 11/05/2022, 6:36 PM

## 2022-11-05 NOTE — [facility] Suicide Risk Assessment (Signed)
[facility] Admission Suicide Risk Assessment   Nursing information obtained from:   (Patient) Demographic factors:  Male, Low socioeconomic status, Unemployed Current Mental Status:  Suicidal ideation indicated by others Loss Factors:  NA Historical Factors:  Impulsivity Risk Reduction Factors:  Positive social support  Total Time spent with patient: 30 minutes Principal Problem: Schizophrenia (HCC) Diagnosis:  Principal Problem:   Schizophrenia (HCC)  Subjective Data: Patient seen and chart reviewed.  Patient brought to the hospital with reports by family that he is bizarre with psychotic behavior poor self-care.  On attempted interview today the patient moaned and grunted and mumbled a few words but even when I asked him politely to repeat himself none of it made any sense.  He rolled back over and went to sleep.  No indication of any self-harm behavior  Continued Clinical Symptoms:  Alcohol Use Disorder Identification Test Final Score (AUDIT): 0 The "Alcohol Use Disorders Identification Test", Guidelines for Use in Primary Care, Second Edition.  World Science writer [facility]). Score between 0-7:  no or low risk or alcohol related problems. Score between 8-15:  moderate risk of alcohol related problems. Score between 16-19:  high risk of alcohol related problems. Score 20 or above:  warrants further diagnostic evaluation for alcohol dependence and treatment.   CLINICAL FACTORS:   Alcohol/Substance Abuse/Dependencies Schizophrenia:   Paranoid or undifferentiated type   Musculoskeletal: Strength & Muscle Tone: within normal limits Gait & Station: normal Patient leans: N/A  Psychiatric Specialty Exam:  Presentation  General Appearance:  Casual  Eye Contact: Fleeting  Speech: Clear and Coherent  Speech Volume: Normal  Handedness: Right   Mood and Affect  Mood: Dysphoric  Affect: Congruent   Thought Process  Thought Processes: Linear  Descriptions of  Associations:Intact  Orientation:Full (Time, Place and Person)  Thought Content:Logical  History of Schizophrenia/Schizoaffective disorder:Yes  Duration of Psychotic Symptoms:Less than six months  Hallucinations:No data recorded Ideas of Reference:None  Suicidal Thoughts:No data recorded Homicidal Thoughts:No data recorded  Sensorium  Memory: Immediate Fair; Recent Fair  Judgment: Fair  Insight: Present; Shallow   Executive Functions  Concentration: Fair  Attention Span: Fair  Recall: [employer] of Knowledge: Fair  Language: Fair   Psychomotor Activity  Psychomotor Activity:No data recorded  Assets  Assets: Physical Health; Resilience   Sleep  Sleep:No data recorded   Physical Exam: Physical Exam Vitals reviewed.  Constitutional:      Appearance: Normal appearance.  HENT:     Head: Normocephalic and atraumatic.     Mouth/Throat:     Pharynx: Oropharynx is clear.  Eyes:     Pupils: Pupils are equal, round, and reactive to light.  Cardiovascular:     Rate and Rhythm: Normal rate and regular rhythm.  Pulmonary:     Effort: Pulmonary effort is normal.     Breath sounds: Normal breath sounds.  Abdominal:     General: Abdomen is flat.     Palpations: Abdomen is soft.  Musculoskeletal:        General: Normal range of motion.  Skin:    General: Skin is warm and dry.  Neurological:     General: No focal deficit present.     Mental Status: He is alert. Mental status is at baseline.  Psychiatric:        Attention and Perception: He is inattentive.        Mood and Affect: Affect is blunt.        Speech: He is noncommunicative.  Review of Systems  Unable to perform ROS: Psychiatric disorder   Blood pressure 112/73, pulse (!) 106, temperature 97.6 F (36.4 C), temperature source Oral, resp. rate 17, height 5\' 11"  (1.803 m), weight 81.2 kg, SpO2 100 %. Body mass index is 24.97 kg/m.   COGNITIVE FEATURES THAT CONTRIBUTE TO RISK:   Thought constriction (tunnel vision)    SUICIDE RISK:   Minimal: No identifiable suicidal ideation.  Patients presenting with no risk factors but with morbid ruminations; may be classified as minimal risk based on the severity of the depressive symptoms  PLAN OF CARE: Continue 15-minute checks.  Restart antipsychotic medication.  Engage in individual and group therapy as much as possible.  Ongoing assessment of dangerousness prior to discharge  I certify that inpatient services furnished can reasonably be expected to improve the patient's condition.   Mordecai Rasmussen, MD 11/05/2022, 3:59 PM

## 2022-11-05 NOTE — Plan of Care (Signed)
  Problem: Safety: Goal: Periods of time without injury will increase Outcome: Progressing   Problem: Education: Goal: Will be free of psychotic symptoms Outcome: Progressing   Problem: Self-Care: Goal: Ability to participate in self-care as condition permits will improve Outcome: Progressing

## 2022-11-06 ENCOUNTER — Encounter: Payer: Self-pay | Admitting: Psychiatry

## 2022-11-06 LAB — LIPID PANEL
Cholesterol: 132 mg/dL (ref 0–200)
HDL: 60 mg/dL (ref >40)
LDL Cholesterol: 65 mg/dL (ref 0–99)
Total CHOL/HDL Ratio: 2.2 RATIO
Triglycerides: 36 mg/dL (ref <150)
VLDL: 7 mg/dL (ref 0–40)

## 2022-11-06 LAB — HEMOGLOBIN A1C
Hgb A1c MFr Bld: 5.1 % (ref 4.8–5.6)
Mean Plasma Glucose: 99.67 mg/dL

## 2022-11-06 MED ORDER — ENSURE ENLIVE PO LIQD
237.0000 mL | Freq: Two times a day (BID) | ORAL | Status: DC
  Administered 2022-11-06 – 2022-11-12 (×10): 237 mL via ORAL

## 2022-11-06 NOTE — Group Note (Signed)
Date:  11/06/2022 Time:  11:51 PM  Group Topic/Focus:  Wrap-Up Group:   The focus of this group is to help patients review their daily goal of treatment and discuss progress on daily workbooks.    Participation Level:  Minimal  Participation Quality:  Appropriate  Affect:  Appropriate and Flat  Cognitive:  Alert  Insight: Appropriate and Limited  Engagement in Group:  Engaged  Modes of Intervention:  Discussion  Additional Comments:     Maglione,Allard Lightsey E 11/06/2022, 11:51 PM

## 2022-11-06 NOTE — Progress Notes (Signed)
[facility] MD Progress Note  11/06/2022 9:55 AM Ronald [name]  MRN:  [id number] Subjective: Patient seen for follow-up.  27 year old man with schizophrenia and a history of substance abuse.  Patient does get up and eat but otherwise stays in bed most of the time.  When I came in to see him he woke up but once again was unable to speak coherently.  I would say that he came a little bit closer to making coherent words today but still was not able to articulate anything meaningful.  Looked tired.  Vitals stable. Principal Problem: Schizophrenia (HCC) Diagnosis: Principal Problem:   Schizophrenia (HCC)  Total Time spent with patient: 30 minutes  Past Psychiatric History: Past history of several episodes similar to this usually associated with substance abuse and medicine noncompliance  Past Medical History:  Past Medical History:  Diagnosis Date   Asthma    History reviewed. No pertinent surgical history. Family History: History reviewed. No pertinent family history. Family Psychiatric  History: See previous Social History:  Social History   Substance and Sexual Activity  Alcohol Use Yes   Alcohol/week: 2.0 standard drinks of alcohol   Types: 2 Cans of beer per week   Comment: unable to determine quantity/frequency     Social History   Substance and Sexual Activity  Drug Use Yes   Types: Marijuana, Methamphetamines   Comment: unable to determine quantity/frequency    Social History   Socioeconomic History   Marital status: Single    Spouse name: Not on file   Number of children: Not on file   Years of education: Not on file   Highest education level: Not on file  Occupational History   Not on file  Tobacco Use   Smoking status: Every Day    Packs/day: .5    Types: Cigarettes   Smokeless tobacco: Current   Tobacco comments:    Patient endorsed smoking cigarettes after denying earlier in the day.    Vaping Use   Vaping Use: Never used  Substance and Sexual Activity    Alcohol use: Yes    Alcohol/week: 2.0 standard drinks of alcohol    Types: 2 Cans of beer per week    Comment: unable to determine quantity/frequency   Drug use: Yes    Types: Marijuana, Methamphetamines    Comment: unable to determine quantity/frequency   Sexual activity: Yes    Partners: Female    Birth control/protection: Condom  Other Topics Concern   Not on file  Social History Narrative   ** Merged History Encounter **       ** Merged History Encounter **       Social Determinants of Health   Financial Resource Strain: Not on file  Food Insecurity: No Food Insecurity (11/04/2022)   Hunger Vital Sign    Worried About Running Out of Food in the Last Year: Never true    Ran Out of Food in the Last Year: Never true  Transportation Needs: No Transportation Needs (11/04/2022)   PRAPARE - Administrator, Civil Service (Medical): No    Lack of Transportation (Non-Medical): No  Physical Activity: Not on file  Stress: Not on file  Social Connections: Not on file   Additional Social History:                         Sleep: Fair  Appetite:  Fair  Current Medications: Current Facility-Administered Medications  Medication Dose Route Frequency Provider Last  Rate Last Admin   acetaminophen (TYLENOL) tablet 650 mg  650 mg Oral Q6H PRN Charm Rings, NP       alum & mag hydroxide-simeth (MAALOX/MYLANTA) 200-200-20 MG/5ML suspension 30 mL  30 mL Oral Q4H PRN Charm Rings, NP       benztropine (COGENTIN) tablet 1 mg  1 mg Oral QHS Charm Rings, NP   1 mg at 11/05/22 2152   diphenhydrAMINE (BENADRYL) capsule 50 mg  50 mg Oral BID PRN Charm Rings, NP       Or   diphenhydrAMINE (BENADRYL) injection 50 mg  50 mg Intramuscular BID PRN Charm Rings, NP       gabapentin (NEURONTIN) capsule 300 mg  300 mg Oral TID Charm Rings, NP   300 mg at 11/06/22 0825   haloperidol (HALDOL) tablet 5 mg  5 mg Oral BID PRN Charm Rings, NP       Or   haloperidol  lactate (HALDOL) injection 5 mg  5 mg Intramuscular BID PRN Charm Rings, NP       magnesium hydroxide (MILK OF MAGNESIA) suspension 30 mL  30 mL Oral Daily PRN Charm Rings, NP       risperiDONE (RISPERDAL) tablet 4 mg  4 mg Oral QHS Charm Rings, NP   4 mg at 11/05/22 2152   traZODone (DESYREL) tablet 50 mg  50 mg Oral QHS Charm Rings, NP   50 mg at 11/05/22 2152    Lab Results:  Results for orders placed or performed during the hospital encounter of 11/04/22 (from the past 48 hour(s))  Lipid panel     Status: None   Collection Time: 11/06/22  7:39 AM  Result Value Ref Range   Cholesterol 132 0 - 200 mg/dL   Triglycerides 36 <093 mg/dL   HDL 60 >23 mg/dL   Total CHOL/HDL Ratio 2.2 RATIO   VLDL 7 0 - 40 mg/dL   LDL Cholesterol 65 0 - 99 mg/dL    Comment:        Total Cholesterol/HDL:CHD Risk Coronary Heart Disease Risk Table                     Men   Women  1/2 Average Risk   3.4   3.3  Average Risk       5.0   4.4  2 X Average Risk   9.6   7.1  3 X Average Risk  23.4   11.0        Use the calculated Patient Ratio above and the CHD Risk Table to determine the patient's CHD Risk.        ATP III CLASSIFICATION (LDL):  <100     mg/dL   Optimal  557-322  mg/dL   Near or Above                    Optimal  130-159  mg/dL   Borderline  025-427  mg/dL   High  >062     mg/dL   Very High Performed at [facility], [address] [place], [place] [address]     Blood Alcohol level:  Lab Results  Component Value Date   [facility] <10 11/04/2022   ETH <10 08/29/2022    Metabolic Disorder Labs: Lab Results  Component Value Date   HGBA1C 5.1 11/02/2021   MPG 99.67 11/02/2021   Lab Results  Component Value Date  PROLACTIN 22.8 (H) 05/08/2019   Lab Results  Component Value Date   CHOL 132 11/06/2022   TRIG 36 11/06/2022   HDL 60 11/06/2022   CHOLHDL 2.2 11/06/2022   VLDL 7 11/06/2022   LDLCALC 65 11/06/2022   LDLCALC 53 11/02/2021    Physical  Findings: AIMS: Facial and Oral Movements Muscles of Facial Expression: Moderate Lips and Perioral Area: None, normal Jaw: None, normal Tongue: None, normal,Extremity Movements Upper (arms, wrists, hands, fingers): Moderate Lower (legs, knees, ankles, toes): Moderate, Trunk Movements Neck, shoulders, hips: None, normal, Overall Severity Severity of abnormal movements (highest score from questions above): Mild Incapacitation due to abnormal movements: Minimal Patient's awareness of abnormal movements (rate only patient's report): Aware, mild distress, Dental Status Current problems with teeth and/or dentures?: No Does patient usually wear dentures?: No  CIWA:  CIWA-Ar Total: 5 COWS:  COWS Total Score: 6  Musculoskeletal: Strength & Muscle Tone: within normal limits Gait & Station: normal Patient leans: N/A  Psychiatric Specialty Exam:  Presentation  General Appearance:  Casual  Eye Contact: Fleeting  Speech: Clear and Coherent  Speech Volume: Normal  Handedness: Right   Mood and Affect  Mood: Dysphoric  Affect: Congruent   Thought Process  Thought Processes: Linear  Descriptions of Associations:Intact  Orientation:Full (Time, Place and Person)  Thought Content:Logical  History of Schizophrenia/Schizoaffective disorder:Yes  Duration of Psychotic Symptoms:Less than six months  Hallucinations:No data recorded Ideas of Reference:None  Suicidal Thoughts:No data recorded Homicidal Thoughts:No data recorded  Sensorium  Memory: Immediate Fair; Recent Fair  Judgment: Fair  Insight: Present; Shallow   Executive Functions  Concentration: Fair  Attention Span: Fair  Recall: [employer] of Knowledge: Fair  Language: Fair   Psychomotor Activity  Psychomotor Activity:No data recorded  Assets  Assets: Physical Health; Resilience   Sleep  Sleep:No data recorded   Physical Exam: Physical Exam Vitals and nursing note reviewed.   Constitutional:      Appearance: Normal appearance.  HENT:     Head: Normocephalic and atraumatic.     Mouth/Throat:     Pharynx: Oropharynx is clear.  Eyes:     Pupils: Pupils are equal, round, and reactive to light.  Cardiovascular:     Rate and Rhythm: Normal rate and regular rhythm.  Pulmonary:     Effort: Pulmonary effort is normal.     Breath sounds: Normal breath sounds.  Abdominal:     General: Abdomen is flat.     Palpations: Abdomen is soft.  Musculoskeletal:        General: Normal range of motion.  Skin:    General: Skin is warm and dry.  Neurological:     General: No focal deficit present.     Mental Status: He is alert. Mental status is at baseline.  Psychiatric:        Attention and Perception: He is inattentive.        Mood and Affect: Affect is blunt.        Speech: He is noncommunicative.        Behavior: Behavior is withdrawn.    Review of Systems  Unable to perform ROS: Psychiatric disorder   Blood pressure (!) 74/63, pulse 89, temperature 98.6 F (37 C), temperature source Oral, resp. rate 17, height 5\' 11"  (1.803 m), weight 81.2 kg, SpO2 93 %. Body mass index is 24.97 kg/m.   Treatment Plan Summary: Plan from our experience this is pretty typical of how this patient's pattern is when he is admitted.  It usually takes about 2 days of rest before he starts to get his mind straight and comes back to himself.  Encouraged him to continue to eat and drink well and to let staff know if he has any needs.  No change to orders for now.  Mordecai Rasmussen, MD 11/06/2022, 9:55 AM

## 2022-11-06 NOTE — [facility] Group Notes (Signed)
[facility] Group Notes:  (Nursing/MHT/Case Management/Adjunct)  Date:  11/06/2022  Time:  3:02 PM  Type of Therapy:  Psychoeducational Skills  Participation Level:  Did Not Attend  Participation Quality:   na  Affect:   na  Cognitive:   na  Insight:  None  Engagement in Group:   na  Modes of Intervention:   na  Summary of Progress/Problems: did not attend.   Ronald [name] 11/06/2022, 3:02 PM

## 2022-11-06 NOTE — B[facility] Counselor (Signed)
Adult Comprehensive Assessment  Patient ID: Ronald [name], male   DOB: 09-Mar-1996, 27 y.o.   MRN: [id number]  Information Source: Information source: Patient  Current Stressors:  Patient states their primary concerns and needs for treatment are:: During assessment, patient denies any mental health illness, states his mother keeps commiting him to treatment in order to "restrict (his) movement." Currently, patient is denying SI/HI.AVH. Per chart review, patient's mother reports he has been on a drug binge where he kept making suicidal threats. Patient is known to use methamphetamine, however, does not provide details about his most recent use. Patient states their goals for this hospitilization and ongoing recovery are:: Patient is unable/unwilling to develop a goal for hospitalization, only states his mother wants him to go to drug treatmnet. Educational / Learning stressors: none reported Employment / Job issues: patient is unemployed Family Relationships: reports conflict with mother Surveyor, quantity / Lack of resources (include bankruptcy): reports no income Housing / Lack of housing: patient is homeless Physical health (include injuries & life threatening diseases): none reported Social relationships: patient isolates to self Substance abuse: see SUD sextion for details Bereavement / Loss: none reported  Living/Environment/Situation:  Living Arrangements: Parent Living conditions (as described by patient or guardian): reports he lives with his mother at times, though at times he is homeless Who else lives in the home?: mother How long has patient lived in current situation?: unknown What is atmosphere in current home: Chaotic  Family History:  Marital status: Single Are you sexually active?: No What is your sexual orientation?: Bisexual Has your sexual activity been affected by drugs, alcohol, medication, or emotional stress?: N/A Does patient have children?: No  Childhood History:   By whom was/is the patient raised?: Mother Additional childhood history information: states he did not meet his father until age 53 y/o Description of patient's relationship with caregiver when they were a child: states he had a better relationship with the TV than his parents How were you disciplined when you got in trouble as a child/adolescent?: Unable to assess. Does patient have siblings?: Yes Number of Siblings: 3 Description of patient's current relationship with siblings: states he gets along well with his deaf brother, does not talk with his other two brothers Did patient suffer any verbal/emotional/physical/sexual abuse as a child?: No Did patient suffer from severe childhood neglect?: No Has patient ever been sexually abused/assaulted/raped as an adolescent or adult?: No Was the patient ever a victim of a crime or a disaster?: No Witnessed domestic violence?: No Has patient been affected by domestic violence as an adult?: No  Education:  Highest grade of school patient has completed: HS Diploma Currently a student?: No Learning disability?: No  Employment/Work Situation:   Employment Situation: Unemployed (since Dec 2023) Patient's Job has Been Impacted by Current Illness: No What is the Longest Time Patient has Held a Job?: "A year or maybe like 10 months." Where was the Patient Employed at that Time?: [employer] Has Patient ever Been in the [employer]?: No  Financial Resources:   Surveyor, quantity resources: No income, Medicaid Does patient have a Lawyer or guardian?: No  Alcohol/Substance Abuse:   Social History   Substance and Sexual Activity  Alcohol Use Not Currently   Alcohol/week: 2.0 standard drinks of alcohol   Types: 2 Cans of beer per week   Social History   Substance and Sexual Activity  Drug Use Yes   Types: Marijuana, Methamphetamines   Comment: unable to determine quantity/frequency   Tobacco Use: High  Risk (11/06/2022)   Patient History     Smoking Tobacco Use: Every Day    Smokeless Tobacco Use: Current    Passive Exposure: Not on file  Drugs of Abuse     Component Value Date/Time   LABOPIA NONE DETECTED 06/21/2022 1252   COCAINSCRNUR NONE DETECTED 06/21/2022 1252   LABBENZ NONE DETECTED 06/21/2022 1252   AMPHETMU POSITIVE (A) 06/21/2022 1252   THCU NONE DETECTED 06/21/2022 1252   LABBARB NONE DETECTED 06/21/2022 1252     Alcohol/Substance Abuse Treatment Hx: Denies past history Has alcohol/substance abuse ever caused legal problems?: No  Social Support System:   Forensic psychologist System: None Describe Community Support System: patient unable to list social supports Type of faith/religion: none How does patient's faith help to cope with current illness?: n/a  Leisure/Recreation:   Do You Have Hobbies?: No  Strengths/Needs:   Patient states these barriers may affect/interfere with their treatment: none reported Patient states these barriers may affect their return to the community: none reported Other important information patient would like considered in planning for their treatment: none reported  Discharge Plan:   Currently receiving community mental health services: Yes (From Whom) ([facility]) Does patient have access to transportation?: No Does patient have financial barriers related to discharge medications?: No ([employer] Medicaid) Will patient be returning to same living situation after discharge?:  (TBD)  Summary/Recommendations:   Summary and Recommendations (to be completed by the evaluator): 27 y/o male w/ dx of Schizophrenia, undiff from [facility] w/ [place] admitted due to suicidal ideaiton in the context of substance  use. During assessment, patient denies any mental health illness, states his mother keeps commiting him to treatment in order to "restrict (his) movement." Currently, patient is denying SI/HI.AVH. Per chart review, patient's mother reports he has been on a drug binge  where he kept making suicidal threats. Patient is known to use methamphetamine, however, does not provide details about his most recent use. Patient is unable/unwilling to develop a goal for hospitalization, only states his mother wants him to go to drug treatmnet. Treatment recommendations include further crisis stabilization, medication management, group therapy, and case management.  Corky Crafts. 11/06/2022

## 2022-11-06 NOTE — Progress Notes (Signed)
In bed most of shift. Did not come out for snack. No interaction with peers. Medication taken to bedroom. Forwards minimal. Was pleasant. Encouragement and support provided, safety checks maintained. Medications given as prescribed. Pt receptive and remains safe on unit with q15 min checks.

## 2022-11-06 NOTE — Plan of Care (Signed)
  Problem: Education: Goal: Verbalization of understanding the information provided will improve Outcome: Not Progressing   Problem: Activity: Goal: Interest or engagement in activities will improve Outcome: Not Progressing   Problem: Safety: Goal: Periods of time without injury will increase Outcome: Progressing

## 2022-11-06 NOTE — [facility] Suicide Risk Assessment (Signed)
[facility] INPATIENT:  Family/Significant Other Suicide Prevention Education  Suicide Prevention Education:  Patient Refusal for Family/Significant Other Suicide Prevention Education: The patient Ronald [name] has refused to provide written consent for family/significant other to be provided Family/Significant Other Suicide Prevention Education during admission and/or prior to discharge.  Physician notified.  Corky Crafts 11/06/2022, 2:33 PM

## 2022-11-07 NOTE — Progress Notes (Signed)
Patient presents with flat affect, noted out in milieu more. Did attend group. Minimal interaction with peers and staff. Medication compliant. Isolates to self. Forwards minimal. Denies SI, HI, AVH. Encouragement and support provided. Safety checks maintained. Medications given as prescribed. Pt receptive and remains safe on unit with q 15 min checks.

## 2022-11-07 NOTE — Progress Notes (Signed)
[facility] MD Progress Note  11/07/2022 11:40 AM Ronald [name]  MRN:  [id number] Subjective: Follow-up patient with schizophrenia and substance use issues.  Patient seen and chart reviewed.  Patient actually was able to sit up in bed and make brief eye contact today.  He was able to speak a little bit more coherently.  He told me that he thinks that he needs to "pay attention more to [place]".  By which I suppose he means he needs to actually follow up with outpatient mental health treatment.  Denies any physical symptoms.  Still seems confused and not fully able to think clearly. Principal Problem: Schizophrenia (HCC) Diagnosis: Principal Problem:   Schizophrenia (HCC)  Total Time spent with patient: 30 minutes  Past Psychiatric History: Past history of psychotic disorder and recurrent substance use issues  Past Medical History:  Past Medical History:  Diagnosis Date   Asthma    History reviewed. No pertinent surgical history. Family History: History reviewed. No pertinent family history. Family Psychiatric  History: See previous Social History:  Social History   Substance and Sexual Activity  Alcohol Use Not Currently   Alcohol/week: 2.0 standard drinks of alcohol   Types: 2 Cans of beer per week     Social History   Substance and Sexual Activity  Drug Use Yes   Types: Marijuana, Methamphetamines   Comment: unable to determine quantity/frequency    Social History   Socioeconomic History   Marital status: Single    Spouse name: Not on file   Number of children: Not on file   Years of education: Not on file   Highest education level: Not on file  Occupational History   Not on file  Tobacco Use   Smoking status: Every Day    Packs/day: .5    Types: Cigarettes   Smokeless tobacco: Current   Tobacco comments:    Patient endorsed smoking cigarettes after denying earlier in the day.    Vaping Use   Vaping Use: Never used  Substance and Sexual Activity   Alcohol use: Not  Currently    Alcohol/week: 2.0 standard drinks of alcohol    Types: 2 Cans of beer per week   Drug use: Yes    Types: Marijuana, Methamphetamines    Comment: unable to determine quantity/frequency   Sexual activity: Yes    Partners: Female    Birth control/protection: Condom  Other Topics Concern   Not on file  Social History Narrative   ** Merged History Encounter **       ** Merged History Encounter **       Social Determinants of Health   Financial Resource Strain: Not on file  Food Insecurity: No Food Insecurity (11/04/2022)   Hunger Vital Sign    Worried About Running Out of Food in the Last Year: Never true    Ran Out of Food in the Last Year: Never true  Transportation Needs: No Transportation Needs (11/04/2022)   PRAPARE - Administrator, Civil Service (Medical): No    Lack of Transportation (Non-Medical): No  Physical Activity: Not on file  Stress: Not on file  Social Connections: Not on file   Additional Social History:                         Sleep: Fair  Appetite:  Fair  Current Medications: Current Facility-Administered Medications  Medication Dose Route Frequency Provider Last Rate Last Admin   acetaminophen (TYLENOL) tablet 650  mg  650 mg Oral Q6H PRN Charm Rings, NP       alum & mag hydroxide-simeth (MAALOX/MYLANTA) 200-200-20 MG/5ML suspension 30 mL  30 mL Oral Q4H PRN Charm Rings, NP       benztropine (COGENTIN) tablet 1 mg  1 mg Oral QHS Charm Rings, NP   1 mg at 11/06/22 2140   diphenhydrAMINE (BENADRYL) capsule 50 mg  50 mg Oral BID PRN Charm Rings, NP       Or   diphenhydrAMINE (BENADRYL) injection 50 mg  50 mg Intramuscular BID PRN Charm Rings, NP       feeding supplement (ENSURE ENLIVE / ENSURE PLUS) liquid 237 mL  237 mL Oral BID BM Marcianne Ozbun, Jackquline Denmark, MD   237 mL at 11/07/22 1610   gabapentin (NEURONTIN) capsule 300 mg  300 mg Oral TID Charm Rings, NP   300 mg at 11/07/22 9604   haloperidol (HALDOL)  tablet 5 mg  5 mg Oral BID PRN Charm Rings, NP       Or   haloperidol lactate (HALDOL) injection 5 mg  5 mg Intramuscular BID PRN Charm Rings, NP       magnesium hydroxide (MILK OF MAGNESIA) suspension 30 mL  30 mL Oral Daily PRN Charm Rings, NP       risperiDONE (RISPERDAL) tablet 4 mg  4 mg Oral QHS Charm Rings, NP   4 mg at 11/06/22 2140   traZODone (DESYREL) tablet 50 mg  50 mg Oral QHS Charm Rings, NP   50 mg at 11/06/22 2140    Lab Results:  Results for orders placed or performed during the hospital encounter of 11/04/22 (from the past 48 hour(s))  Lipid panel     Status: None   Collection Time: 11/06/22  7:39 AM  Result Value Ref Range   Cholesterol 132 0 - 200 mg/dL   Triglycerides 36 <540 mg/dL   HDL 60 >98 mg/dL   Total CHOL/HDL Ratio 2.2 RATIO   VLDL 7 0 - 40 mg/dL   LDL Cholesterol 65 0 - 99 mg/dL    Comment:        Total Cholesterol/HDL:CHD Risk Coronary Heart Disease Risk Table                     Men   Women  1/2 Average Risk   3.4   3.3  Average Risk       5.0   4.4  2 X Average Risk   9.6   7.1  3 X Average Risk  23.4   11.0        Use the calculated Patient Ratio above and the CHD Risk Table to determine the patient's CHD Risk.        ATP III CLASSIFICATION (LDL):  <100     mg/dL   Optimal  119-147  mg/dL   Near or Above                    Optimal  130-159  mg/dL   Borderline  829-562  mg/dL   High  >130     mg/dL   Very High Performed at [facility], [address] [place], [place] [address]   Hemoglobin A1c     Status: None   Collection Time: 11/06/22  7:39 AM  Result Value Ref Range   Hgb A1c MFr Bld 5.1 4.8 - 5.6 %  Comment: (NOTE) Pre diabetes:          5.7%-6.4%  Diabetes:              >6.4%  Glycemic control for   <7.0% adults with diabetes    Mean Plasma Glucose 99.67 mg/dL    Comment: Performed at [facility], [address] [place], [place] [address]    Blood Alcohol level:  Lab Results   Component Value Date   ETH <10 11/04/2022   ETH <10 08/29/2022    Metabolic Disorder Labs: Lab Results  Component Value Date   HGBA1C 5.1 11/06/2022   MPG 99.67 11/06/2022   MPG 99.67 11/02/2021   Lab Results  Component Value Date   PROLACTIN 22.8 (H) 05/08/2019   Lab Results  Component Value Date   CHOL 132 11/06/2022   TRIG 36 11/06/2022   HDL 60 11/06/2022   CHOLHDL 2.2 11/06/2022   VLDL 7 11/06/2022   LDLCALC 65 11/06/2022   LDLCALC 53 11/02/2021    Physical Findings: AIMS: Facial and Oral Movements Muscles of Facial Expression: Moderate Lips and Perioral Area: None, normal Jaw: None, normal Tongue: None, normal,Extremity Movements Upper (arms, wrists, hands, fingers): Moderate Lower (legs, knees, ankles, toes): Moderate, Trunk Movements Neck, shoulders, hips: None, normal, Overall Severity Severity of abnormal movements (highest score from questions above): Mild Incapacitation due to abnormal movements: Minimal Patient's awareness of abnormal movements (rate only patient's report): Aware, mild distress, Dental Status Current problems with teeth and/or dentures?: No Does patient usually wear dentures?: No  CIWA:  CIWA-Ar Total: 5 COWS:  COWS Total Score: 6  Musculoskeletal: Strength & Muscle Tone: within normal limits Gait & Station: normal Patient leans: N/A  Psychiatric Specialty Exam:  Presentation  General Appearance:  Casual  Eye Contact: Fleeting  Speech: Clear and Coherent  Speech Volume: Normal  Handedness: Right   Mood and Affect  Mood: Dysphoric  Affect: Congruent   Thought Process  Thought Processes: Linear  Descriptions of Associations:Intact  Orientation:Full (Time, Place and Person)  Thought Content:Logical  History of Schizophrenia/Schizoaffective disorder:Yes  Duration of Psychotic Symptoms:Less than six months  Hallucinations:No data recorded Ideas of Reference:None  Suicidal Thoughts:No data  recorded Homicidal Thoughts:No data recorded  Sensorium  Memory: Immediate Fair; Recent Fair  Judgment: Fair  Insight: Present; Shallow   Executive Functions  Concentration: Fair  Attention Span: Fair  Recall: [employer] of Knowledge: Fair  Language: Fair   Psychomotor Activity  Psychomotor Activity:No data recorded  Assets  Assets: Physical Health; Resilience   Sleep  Sleep:No data recorded   Physical Exam: Physical Exam Vitals and nursing note reviewed.  Constitutional:      Appearance: Normal appearance.  HENT:     Head: Normocephalic and atraumatic.     Mouth/Throat:     Pharynx: Oropharynx is clear.  Eyes:     Pupils: Pupils are equal, round, and reactive to light.  Cardiovascular:     Rate and Rhythm: Normal rate and regular rhythm.  Pulmonary:     Effort: Pulmonary effort is normal.     Breath sounds: Normal breath sounds.  Abdominal:     General: Abdomen is flat.     Palpations: Abdomen is soft.  Musculoskeletal:        General: Normal range of motion.  Skin:    General: Skin is warm and dry.  Neurological:     General: No focal deficit present.     Mental Status: He is alert. Mental status  is at baseline.  Psychiatric:        Attention and Perception: Attention normal.        Mood and Affect: Mood normal. Affect is blunt.        Speech: Speech is delayed.        Behavior: Behavior is slowed.        Thought Content: Thought content normal.    Review of Systems  Constitutional:  Positive for malaise/fatigue.  HENT: Negative.    Eyes: Negative.   Respiratory: Negative.    Cardiovascular: Negative.   Gastrointestinal: Negative.   Musculoskeletal: Negative.   Skin: Negative.   Neurological: Negative.   Psychiatric/Behavioral: Negative.     Blood pressure (!) 92/51, pulse 85, temperature 98.6 F (37 C), temperature source Oral, resp. rate 17, height 5\' 11"  (1.803 m), weight 81.2 kg, SpO2 100 %. Body mass index is 24.97  kg/m.   Treatment Plan Summary: Medication management and Plan no change to medication management.  Encourage patient.  Supportive counseling.  Suggest that he be up and certainly making sure he is eating and drinking.  I know it is going to take a few days for him to get back to normal.  Right now he would not be able to care for himself outside the hospital but I expect this will improve.  Mordecai Rasmussen, MD 11/07/2022, 11:40 AM

## 2022-11-07 NOTE — Group Note (Signed)
Date:  11/07/2022 Time:  6:30 PM  Group Topic/Focus:  Activity Group    Participation Level:  Did Not Attend   Mary Sella Latresa Gasser 11/07/2022, 6:30 PM

## 2022-11-07 NOTE — Plan of Care (Signed)
D- Patient alert and oriented. Affect/mood. Denies SI, HI, AVH, and pain. Pt has attended groups,but also isolates in room.  A- Scheduled medications administered to patient, per MD orders. Support and encouragement provided.  Routine safety checks conducted every 15 minutes.  Patient informed to notify staff with problems or concerns.  R- No adverse drug reactions noted. Patient contracts for safety at this time. Patient compliant with medications and treatment plan. Patient receptive, calm, and cooperative. Patient interacts well with others on the unit.  Patient remains safe at this time.

## 2022-11-07 NOTE — Plan of Care (Signed)
  Problem: Education: Goal: Knowledge of [facility] Education information/materials will improve Outcome: Progressing Goal: Emotional status will improve Outcome: Progressing Goal: Mental status will improve Outcome: Progressing   Problem: Education: Goal: Emotional status will improve Outcome: Progressing   Problem: Activity: Goal: Interest or engagement in activities will improve Outcome: Progressing Goal: Sleeping patterns will improve Outcome: Progressing

## 2022-11-07 NOTE — Plan of Care (Signed)
Patient was in dayroom, quiet. He then went to bed. Took medications and currently sleeping. No sign of distress.

## 2022-11-07 NOTE — Group Note (Signed)
[facility] LCSW Group Therapy Note   Group Date: 11/07/2022 Start Time: 1310 End Time: 1420   Type of Therapy/Topic:  Group Therapy:  Emotion Regulation  Participation Level:  Minimal    Description of Group:    The purpose of this group is to assist patients in learning to regulate negative emotions and experience positive emotions. Patients will be guided to discuss ways in which they have been vulnerable to their negative emotions. These vulnerabilities will be juxtaposed with experiences of positive emotions or situations, and patients challenged to use positive emotions to combat negative ones. Special emphasis will be placed on coping with negative emotions in conflict situations, and patients will process healthy conflict resolution skills.  Therapeutic Goals: Patient will identify two positive emotions or experiences to reflect on in order to balance out negative emotions:  Patient will label two or more emotions that they find the most difficult to experience:  Patient will be able to demonstrate positive conflict resolution skills through discussion or role plays:   Summary of Patient Progress: Patient was present for the majority of the group process. He identified coming in and out of the hospital as something that makes him upset. Pt spoke about his family and feeling like every time he gets around them he ends up back in the hospital. At times pt's statements were incoherent. However, he was appropriate during the discussion. Insight into the topic is questionable. He did appear open and receptive to feedback from both peers and facilitator.   Therapeutic Modalities:   Cognitive Behavioral Therapy Feelings Identification Dialectical Behavioral Therapy   Glenis Smoker, LCSW

## 2022-11-08 NOTE — Group Note (Signed)
Date:  11/08/2022 Time:  10:06 PM  Group Topic/Focus:  Wrap-Up Group:   The focus of this group is to help patients review their daily goal of treatment and discuss progress on daily workbooks.    Participation Level:  Active  Participation Quality:  Appropriate and Attentive  Affect:  Appropriate  Cognitive:  Appropriate  Insight: Good  Engagement in Group:  Developing/Improving  Modes of Intervention:  Clarification and Discussion  Additional Comments:     Areej Tayler 11/08/2022, 10:06 PM

## 2022-11-08 NOTE — Progress Notes (Signed)
[facility] MD Progress Note  11/08/2022 10:18 AM Ronald [name]  MRN:  [id number] Subjective: Follow-up for this 27 year old man with schizophrenia.  Patient slightly more interactive today.  Actually able to use full sentences to respond to me and engage in back-and-forth conversation.  Staying isolated mostly in his room but reading a book not sleeping constantly.  Tolerating medication. Principal Problem: Schizophrenia (HCC) Diagnosis: Principal Problem:   Schizophrenia (HCC)  Total Time spent with patient: 30 minutes  Past Psychiatric History: Past history of chronic psychotic disorder and substance use  Past Medical History:  Past Medical History:  Diagnosis Date   Asthma    History reviewed. No pertinent surgical history. Family History: History reviewed. No pertinent family history. Family Psychiatric  History: See previous Social History:  Social History   Substance and Sexual Activity  Alcohol Use Not Currently   Alcohol/week: 2.0 standard drinks of alcohol   Types: 2 Cans of beer per week     Social History   Substance and Sexual Activity  Drug Use Yes   Types: Marijuana, Methamphetamines   Comment: unable to determine quantity/frequency    Social History   Socioeconomic History   Marital status: Single    Spouse name: Not on file   Number of children: Not on file   Years of education: Not on file   Highest education level: Not on file  Occupational History   Not on file  Tobacco Use   Smoking status: Every Day    Packs/day: .5    Types: Cigarettes   Smokeless tobacco: Current   Tobacco comments:    Patient endorsed smoking cigarettes after denying earlier in the day.    Vaping Use   Vaping Use: Never used  Substance and Sexual Activity   Alcohol use: Not Currently    Alcohol/week: 2.0 standard drinks of alcohol    Types: 2 Cans of beer per week   Drug use: Yes    Types: Marijuana, Methamphetamines    Comment: unable to determine quantity/frequency    Sexual activity: Yes    Partners: Female    Birth control/protection: Condom  Other Topics Concern   Not on file  Social History Narrative   ** Merged History Encounter **       ** Merged History Encounter **       Social Determinants of Health   Financial Resource Strain: Not on file  Food Insecurity: No Food Insecurity (11/04/2022)   Hunger Vital Sign    Worried About Running Out of Food in the Last Year: Never true    Ran Out of Food in the Last Year: Never true  Transportation Needs: No Transportation Needs (11/04/2022)   PRAPARE - Administrator, Civil Service (Medical): No    Lack of Transportation (Non-Medical): No  Physical Activity: Not on file  Stress: Not on file  Social Connections: Not on file   Additional Social History:                         Sleep: Fair  Appetite:  Fair  Current Medications: Current Facility-Administered Medications  Medication Dose Route Frequency Provider Last Rate Last Admin   acetaminophen (TYLENOL) tablet 650 mg  650 mg Oral Q6H PRN Charm Rings, NP       alum & mag hydroxide-simeth (MAALOX/MYLANTA) 200-200-20 MG/5ML suspension 30 mL  30 mL Oral Q4H PRN Charm Rings, NP       benztropine (COGENTIN) tablet  1 mg  1 mg Oral QHS Charm Rings, NP   1 mg at 11/07/22 2153   diphenhydrAMINE (BENADRYL) capsule 50 mg  50 mg Oral BID PRN Charm Rings, NP       Or   diphenhydrAMINE (BENADRYL) injection 50 mg  50 mg Intramuscular BID PRN Charm Rings, NP       feeding supplement (ENSURE ENLIVE / ENSURE PLUS) liquid 237 mL  237 mL Oral BID BM Sola Margolis T, MD   237 mL at 11/08/22 0912   gabapentin (NEURONTIN) capsule 300 mg  300 mg Oral TID Charm Rings, NP   300 mg at 11/08/22 8295   haloperidol (HALDOL) tablet 5 mg  5 mg Oral BID PRN Charm Rings, NP       Or   haloperidol lactate (HALDOL) injection 5 mg  5 mg Intramuscular BID PRN Charm Rings, NP       magnesium hydroxide (MILK OF MAGNESIA)  suspension 30 mL  30 mL Oral Daily PRN Charm Rings, NP       risperiDONE (RISPERDAL) tablet 4 mg  4 mg Oral QHS Charm Rings, NP   4 mg at 11/07/22 2153   traZODone (DESYREL) tablet 50 mg  50 mg Oral QHS Charm Rings, NP   50 mg at 11/07/22 2153    Lab Results: No results found for this or any previous visit (from the past 48 hour(s)).  Blood Alcohol level:  Lab Results  Component Value Date   ETH <10 11/04/2022   ETH <10 08/29/2022    Metabolic Disorder Labs: Lab Results  Component Value Date   HGBA1C 5.1 11/06/2022   MPG 99.67 11/06/2022   MPG 99.67 11/02/2021   Lab Results  Component Value Date   PROLACTIN 22.8 (H) 05/08/2019   Lab Results  Component Value Date   CHOL 132 11/06/2022   TRIG 36 11/06/2022   HDL 60 11/06/2022   CHOLHDL 2.2 11/06/2022   VLDL 7 11/06/2022   LDLCALC 65 11/06/2022   LDLCALC 53 11/02/2021    Physical Findings: AIMS: Facial and Oral Movements Muscles of Facial Expression: Moderate Lips and Perioral Area: None, normal Jaw: None, normal Tongue: None, normal,Extremity Movements Upper (arms, wrists, hands, fingers): Moderate Lower (legs, knees, ankles, toes): Moderate, Trunk Movements Neck, shoulders, hips: None, normal, Overall Severity Severity of abnormal movements (highest score from questions above): Mild Incapacitation due to abnormal movements: Minimal Patient's awareness of abnormal movements (rate only patient's report): Aware, mild distress, Dental Status Current problems with teeth and/or dentures?: No Does patient usually wear dentures?: No  CIWA:  CIWA-Ar Total: 5 COWS:  COWS Total Score: 6  Musculoskeletal: Strength & Muscle Tone: within normal limits Gait & Station: normal Patient leans: N/A  Psychiatric Specialty Exam:  Presentation  General Appearance:  Casual  Eye Contact: Fleeting  Speech: Clear and Coherent  Speech Volume: Normal  Handedness: Right   Mood and Affect   Mood: Dysphoric  Affect: Congruent   Thought Process  Thought Processes: Linear  Descriptions of Associations:Intact  Orientation:Full (Time, Place and Person)  Thought Content:Logical  History of Schizophrenia/Schizoaffective disorder:Yes  Duration of Psychotic Symptoms:Less than six months  Hallucinations:No data recorded Ideas of Reference:None  Suicidal Thoughts:No data recorded Homicidal Thoughts:No data recorded  Sensorium  Memory: Immediate Fair; Recent Fair  Judgment: Fair  Insight: Present; Shallow   Executive Functions  Concentration: Fair  Attention Span: Fair  Recall: [employer] of Knowledge: Fair  Language: Fair   Psychomotor Activity  Psychomotor Activity:No data recorded  Assets  Assets: Physical Health; Resilience   Sleep  Sleep:No data recorded   Physical Exam: Physical Exam Vitals reviewed.  Constitutional:      Appearance: Normal appearance.  HENT:     Head: Normocephalic and atraumatic.     Mouth/Throat:     Pharynx: Oropharynx is clear.  Eyes:     Pupils: Pupils are equal, round, and reactive to light.  Cardiovascular:     Rate and Rhythm: Normal rate and regular rhythm.  Pulmonary:     Effort: Pulmonary effort is normal.     Breath sounds: Normal breath sounds.  Abdominal:     General: Abdomen is flat.     Palpations: Abdomen is soft.  Musculoskeletal:        General: Normal range of motion.  Skin:    General: Skin is warm and dry.  Neurological:     General: No focal deficit present.     Mental Status: He is alert. Mental status is at baseline.  Psychiatric:        Attention and Perception: He is inattentive.        Mood and Affect: Mood normal. Affect is blunt.        Speech: Speech is delayed.        Behavior: Behavior is slowed.        Thought Content: Thought content normal.    Review of Systems  Constitutional: Negative.   HENT: Negative.    Eyes: Negative.   Respiratory: Negative.     Cardiovascular: Negative.   Gastrointestinal: Negative.   Musculoskeletal: Negative.   Skin: Negative.   Neurological: Negative.   Psychiatric/Behavioral: Negative.     Blood pressure 107/68, pulse 81, temperature 98 F (36.7 C), temperature source Oral, resp. rate 20, height 5\' 11"  (1.803 m), weight 81.2 kg, SpO2 100 %. Body mass index is 24.97 kg/m.   Treatment Plan Summary: Plan no change to medication management.  Gradually getting better.  Patient mentioned something about going to rehab today which I would be surprised if anything ultimately comes with that but encouraged him to follow up with social work about it.  Mordecai Rasmussen, MD 11/08/2022, 10:18 AM

## 2022-11-08 NOTE — Group Note (Signed)
Date:  11/08/2022 Time:  4:21 PM  Group Topic/Focus:  Goals Group:   The focus of this group is to help patients establish daily goals to achieve during treatment and discuss how the patient can incorporate goal setting into their daily lives to aide in recovery. Personal Choices and Values:   The focus of this group is to help patients assess and explore the importance of values in their lives, how their values affect their decisions, how they express their values and what opposes their expression. Self Care:   The focus of this group is to help patients understand the importance of self-care in order to improve or restore emotional, physical, spiritual, interpersonal, and financial health.    Participation Level:  Active  Participation Quality:  Appropriate  Affect:  Appropriate  Cognitive:  Alert and Appropriate  Insight: Appropriate and Good  Engagement in Group:  Engaged  Modes of Intervention:  Activity and Discussion  Additional Comments:    Doug Sou 11/08/2022, 4:21 PM

## 2022-11-08 NOTE — Group Note (Signed)
[facility] LCSW Group Therapy Note   Group Date: 11/08/2022 Start Time: 1330 End Time: 1420   Type of Therapy/Topic:  Group Therapy:  Balance in Life  Participation Level:  Active   Description of Group:    This group will address the concept of balance and how it feels and looks when one is unbalanced. Patients will be encouraged to process areas in their lives that are out of balance, and identify reasons for remaining unbalanced. Facilitators will guide patients utilizing problem- solving interventions to address and correct the stressor making their life unbalanced. Understanding and applying boundaries will be explored and addressed for obtaining  and maintaining a balanced life. Patients will be encouraged to explore ways to assertively make their unbalanced needs known to significant others in their lives, using other group members and facilitator for support and feedback.  Therapeutic Goals: Patient will identify two or more emotions or situations they have that consume much of in their lives. Patient will identify signs/triggers that life has become out of balance:  Patient will identify two ways to set boundaries in order to achieve balance in their lives:  Patient will demonstrate ability to communicate their needs through discussion and/or role plays  Summary of Patient Progress: Patient was present for the entirety of the group process. He defined balance as having a healthy amount of all the part of your life. Pt shared that being about the wrong people can cause him to become off balanced and specifically speaks about the relationship with his mother. He shared that not having a license causes him to feel like his life stopped. Pt stated that he feels he needs to get his license in order to move forward with his life and goals. Changing negative thoughts patterns to positive, drinking water/making sure we stay hydrated, visualization, and taking a moment to breathe were identified as  ways that he maintains balance. One new thing that he can do to help maintain balance was identified as studying. Pt voices desire to return to school and get his driver's license which he realizes will necessitate that he study for these. Pt appeared to have slight insight into the topic. He appeared open and receptive to feedback/comments from both peer and facilitators.   Therapeutic Modalities:   Cognitive Behavioral Therapy Solution-Focused Therapy Assertiveness Training   Glenis Smoker, LCSW

## 2022-11-08 NOTE — Progress Notes (Addendum)
D- Patient alert and oriented. Patient presented in a pleasant mood on assessment reporting that he slept "good" last night and had no complaints to voice to this Clinical research associate. Although patient endorsed anxiety on his self-inventory, he denied any signs/symptoms to this writer, stating that overall, "I'm alright". Patient also denied SI, HI, AVH, and pain at this time. Patient's goal for today is to be engaged more in groups", in which he has been observed out in the milieu, as well as attending and participating unit groups throughout the day.  A- Scheduled medications administered to patient, per MD orders. Support and encouragement provided. Routine safety checks conducted every 15 minutes. Patient informed to notify staff with problems or concerns.  R- No adverse drug reactions noted. Patient contracts for safety at this time. Patient compliant with medications and treatment plan. Patient receptive, calm, and cooperative. Patient interacts well with others on the unit. Patient remains safe at this time.   11/08/22 0915  Psych Admission Type (Psych Patients Only)  Admission Status Involuntary  Psychosocial Assessment  Patient Complaints None  Eye Contact Fair  Facial Expression Flat  Affect Flat  Speech Slow  Interaction Assertive  Motor Activity Slow  Appearance/Hygiene Disheveled;Poor hygiene  Behavior Characteristics Cooperative;Appropriate to situation  Mood Pleasant  Aggressive Behavior  Effect No apparent injury  Thought Process  Coherency Circumstantial  Content WDL  Delusions None reported or observed  Perception WDL  Hallucination None reported or observed  Judgment WDL  Confusion None  Danger to Self  Current suicidal ideation? Denies  Agreement Not to Harm Self Yes  Description of Agreement Verbal  Danger to Others  Danger to Others None reported or observed

## 2022-11-08 NOTE — Group Note (Signed)
Date:  11/08/2022 Time:  8:25 PM  Group Topic/Focus:  Wrap-Up Group:   The focus of this group is to help patients review their daily goal of treatment and discuss progress on daily workbooks.    Participation Level:  Active  Participation Quality:  Appropriate, Attentive, Sharing, and Supportive  Affect:  Appropriate  Cognitive:  Alert and Appropriate  Insight: Appropriate and Good  Engagement in Group:  Developing/Improving, Engaged, Improving, and Supportive  Modes of Intervention:  Clarification, Discussion, Socialization, and Support  Additional Comments:     Beata Beason 11/08/2022, 8:25 PM

## 2022-11-08 NOTE — Plan of Care (Signed)
  Problem: Education: Goal: Knowledge of [facility] Education information/materials will improve Outcome: Progressing Goal: Emotional status will improve Outcome: Progressing Goal: Mental status will improve Outcome: Progressing Goal: Verbalization of understanding the information provided will improve Outcome: Progressing   Problem: Activity: Goal: Interest or engagement in activities will improve Outcome: Progressing Goal: Sleeping patterns will improve Outcome: Progressing   Problem: Coping: Goal: Ability to verbalize frustrations and anger appropriately will improve Outcome: Progressing Goal: Ability to demonstrate self-control will improve Outcome: Progressing   Problem: Health Behavior/Discharge Planning: Goal: Identification of resources available to assist in meeting health care needs will improve Outcome: Progressing Goal: Compliance with treatment plan for underlying cause of condition will improve Outcome: Progressing   Problem: Physical Regulation: Goal: Ability to maintain clinical measurements within normal limits will improve Outcome: Progressing   Problem: Safety: Goal: Periods of time without injury will increase Outcome: Progressing   Problem: Activity: Goal: Will verbalize the importance of balancing activity with adequate rest periods Outcome: Progressing   Problem: Education: Goal: Will be free of psychotic symptoms Outcome: Progressing Goal: Knowledge of the prescribed therapeutic regimen will improve Outcome: Progressing   Problem: Coping: Goal: Coping ability will improve Outcome: Progressing Goal: Will verbalize feelings Outcome: Progressing   Problem: Health Behavior/Discharge Planning: Goal: Compliance with prescribed medication regimen will improve Outcome: Progressing   Problem: Nutritional: Goal: Ability to achieve adequate nutritional intake will improve Outcome: Progressing   Problem: Role Relationship: Goal:  Ability to communicate needs accurately will improve Outcome: Progressing Goal: Ability to interact with others will improve Outcome: Progressing   Problem: Safety: Goal: Ability to redirect hostility and anger into socially appropriate behaviors will improve Outcome: Progressing Goal: Ability to remain free from injury will improve Outcome: Progressing   Problem: Self-Care: Goal: Ability to participate in self-care as condition permits will improve Outcome: Progressing   Problem: Self-Concept: Goal: Will verbalize positive feelings about self Outcome: Progressing   

## 2022-11-09 NOTE — Progress Notes (Signed)
Patient was visible in the milieu, he was compliant with medications. He had no c/o of w/d. He denies SI, HI & AVH. He seemed to sleep well through out the night.

## 2022-11-09 NOTE — [facility] Counselor (Signed)
CSW sat with pt while meeting with [facility]. Ronald [name]. Pt voiced interest in SUD rehab during this meeting. CSW informed him that he would be given a resource list to review and then he should let the CSW know which programs he is interested in so that the team could begin working on their requirements. He agreed. No other concerns expressed. Contact ended without incident.   Pt received copy of resource list for review.  Vilma Meckel. Ronald [name], MSW, LCSW, LCAS 11/09/2022 4:36 PM

## 2022-11-09 NOTE — Group Note (Signed)
Date:  11/09/2022 Time:  11:23 AM  Group Topic/Focus:  Activity Group    Participation Level:  Active  Participation Quality:  Appropriate  Affect:  Appropriate  Cognitive:  Appropriate  Insight: Appropriate  Engagement in Group:  Engaged  Modes of Intervention:  Activity  Additional Comments:    Kyndle Schlender M Destin Kittler 11/09/2022, 11:23 AM  

## 2022-11-09 NOTE — Plan of Care (Signed)
D- Patient alert and oriented. Patient presented in a pleasant mood on assessment reporting that he slept "good" last night and had no complaints to voice to this writer at this time. Patient endorsed hopelessness, depression, and anxiety on his self-inventory, but did not go into detail as to why he's feeling this way. Patient denied SI, HI, AVH, and pain at this time. Patient's goal for today is "listen more", in which he will "engage in group", in order to achieve his goal.  A- Scheduled medications administered to patient, per MD orders. Support and encouragement provided.  Routine safety checks conducted every 15 minutes.  Patient informed to notify staff with problems or concerns.  R- No adverse drug reactions noted. Patient contracts for safety at this time. Patient compliant with medications and treatment plan. Patient receptive, calm, and cooperative. Patient interacts well with others on the unit. Patient remains safe at this time.  Problem: Education: Goal: Knowledge of [facility] Education information/materials will improve Outcome: Progressing Goal: Emotional status will improve Outcome: Progressing Goal: Mental status will improve Outcome: Progressing Goal: Verbalization of understanding the information provided will improve Outcome: Progressing   Problem: Activity: Goal: Interest or engagement in activities will improve Outcome: Progressing Goal: Sleeping patterns will improve Outcome: Progressing   Problem: Coping: Goal: Ability to verbalize frustrations and anger appropriately will improve Outcome: Progressing Goal: Ability to demonstrate self-control will improve Outcome: Progressing   Problem: Health Behavior/Discharge Planning: Goal: Identification of resources available to assist in meeting health care needs will improve Outcome: Progressing Goal: Compliance with treatment plan for underlying cause of condition will improve Outcome: Progressing    Problem: Physical Regulation: Goal: Ability to maintain clinical measurements within normal limits will improve Outcome: Progressing   Problem: Safety: Goal: Periods of time without injury will increase Outcome: Progressing   Problem: Activity: Goal: Will verbalize the importance of balancing activity with adequate rest periods Outcome: Progressing   Problem: Education: Goal: Will be free of psychotic symptoms Outcome: Progressing Goal: Knowledge of the prescribed therapeutic regimen will improve Outcome: Progressing   Problem: Coping: Goal: Coping ability will improve Outcome: Progressing Goal: Will verbalize feelings Outcome: Progressing   Problem: Health Behavior/Discharge Planning: Goal: Compliance with prescribed medication regimen will improve Outcome: Progressing   Problem: Nutritional: Goal: Ability to achieve adequate nutritional intake will improve Outcome: Progressing   Problem: Role Relationship: Goal: Ability to communicate needs accurately will improve Outcome: Progressing Goal: Ability to interact with others will improve Outcome: Progressing   Problem: Safety: Goal: Ability to redirect hostility and anger into socially appropriate behaviors will improve Outcome: Progressing Goal: Ability to remain free from injury will improve Outcome: Progressing   Problem: Self-Care: Goal: Ability to participate in self-care as condition permits will improve Outcome: Progressing   Problem: Self-Concept: Goal: Will verbalize positive feelings about self Outcome: Progressing

## 2022-11-09 NOTE — Progress Notes (Signed)
[facility] MD Progress Note  11/09/2022 4:12 PM Ronald [name]  MRN:  [id number] Subjective: Patient seen.  Still confused and disorganized but it is gradually getting better every day.  He was out of his room today and had a meeting with his care manager from [facility].  Also had a meeting with other staff and is participating in social groups.  Calming down a little bit.  Eating better. Principal Problem: Schizophrenia (HCC) Diagnosis: Principal Problem:   Schizophrenia (HCC)  Total Time spent with patient: 30 minutes  Past Psychiatric History: Tolerating medicine past history of schizophrenia and substance abuse  Past Medical History:  Past Medical History:  Diagnosis Date   Asthma    History reviewed. No pertinent surgical history. Family History: History reviewed. No pertinent family history. Family Psychiatric  History: See previous Social History:  Social History   Substance and Sexual Activity  Alcohol Use Not Currently   Alcohol/week: 2.0 standard drinks of alcohol   Types: 2 Cans of beer per week     Social History   Substance and Sexual Activity  Drug Use Yes   Types: Marijuana, Methamphetamines   Comment: unable to determine quantity/frequency    Social History   Socioeconomic History   Marital status: Single    Spouse name: Not on file   Number of children: Not on file   Years of education: Not on file   Highest education level: Not on file  Occupational History   Not on file  Tobacco Use   Smoking status: Every Day    Packs/day: .5    Types: Cigarettes   Smokeless tobacco: Current   Tobacco comments:    Patient endorsed smoking cigarettes after denying earlier in the day.    Vaping Use   Vaping Use: Never used  Substance and Sexual Activity   Alcohol use: Not Currently    Alcohol/week: 2.0 standard drinks of alcohol    Types: 2 Cans of beer per week   Drug use: Yes    Types: Marijuana, Methamphetamines    Comment: unable to determine quantity/frequency    Sexual activity: Yes    Partners: Female    Birth control/protection: Condom  Other Topics Concern   Not on file  Social History Narrative   ** Merged History Encounter **       ** Merged History Encounter **       Social Determinants of Health   Financial Resource Strain: Not on file  Food Insecurity: No Food Insecurity (11/04/2022)   Hunger Vital Sign    Worried About Running Out of Food in the Last Year: Never true    Ran Out of Food in the Last Year: Never true  Transportation Needs: No Transportation Needs (11/04/2022)   PRAPARE - Administrator, Civil Service (Medical): No    Lack of Transportation (Non-Medical): No  Physical Activity: Not on file  Stress: Not on file  Social Connections: Not on file   Additional Social History:                         Sleep: Fair  Appetite:  Fair  Current Medications: Current Facility-Administered Medications  Medication Dose Route Frequency Provider Last Rate Last Admin   acetaminophen (TYLENOL) tablet 650 mg  650 mg Oral Q6H PRN Charm Rings, NP       alum & mag hydroxide-simeth (MAALOX/MYLANTA) 200-200-20 MG/5ML suspension 30 mL  30 mL Oral Q4H PRN Charm Rings,  NP       benztropine (COGENTIN) tablet 1 mg  1 mg Oral QHS Charm Rings, NP   1 mg at 11/08/22 2118   diphenhydrAMINE (BENADRYL) capsule 50 mg  50 mg Oral BID PRN Charm Rings, NP       Or   diphenhydrAMINE (BENADRYL) injection 50 mg  50 mg Intramuscular BID PRN Charm Rings, NP       feeding supplement (ENSURE ENLIVE / ENSURE PLUS) liquid 237 mL  237 mL Oral BID BM [place] T, MD   237 mL at 11/09/22 0900   gabapentin (NEURONTIN) capsule 300 mg  300 mg Oral TID Charm Rings, NP   300 mg at 11/09/22 1157   haloperidol (HALDOL) tablet 5 mg  5 mg Oral BID PRN Charm Rings, NP       Or   haloperidol lactate (HALDOL) injection 5 mg  5 mg Intramuscular BID PRN Charm Rings, NP       magnesium hydroxide (MILK OF MAGNESIA)  suspension 30 mL  30 mL Oral Daily PRN Charm Rings, NP       risperiDONE (RISPERDAL) tablet 4 mg  4 mg Oral QHS Charm Rings, NP   4 mg at 11/08/22 2118   traZODone (DESYREL) tablet 50 mg  50 mg Oral QHS Charm Rings, NP   50 mg at 11/08/22 2118    Lab Results: No results found for this or any previous visit (from the past 48 hour(s)).  Blood Alcohol level:  Lab Results  Component Value Date   ETH <10 11/04/2022   ETH <10 08/29/2022    Metabolic Disorder Labs: Lab Results  Component Value Date   HGBA1C 5.1 11/06/2022   MPG 99.67 11/06/2022   MPG 99.67 11/02/2021   Lab Results  Component Value Date   PROLACTIN 22.8 (H) 05/08/2019   Lab Results  Component Value Date   CHOL 132 11/06/2022   TRIG 36 11/06/2022   HDL 60 11/06/2022   CHOLHDL 2.2 11/06/2022   VLDL 7 11/06/2022   LDLCALC 65 11/06/2022   LDLCALC 53 11/02/2021    Physical Findings: AIMS: Facial and Oral Movements Muscles of Facial Expression: Moderate Lips and Perioral Area: None, normal Jaw: None, normal Tongue: None, normal,Extremity Movements Upper (arms, wrists, hands, fingers): Moderate Lower (legs, knees, ankles, toes): Moderate, Trunk Movements Neck, shoulders, hips: None, normal, Overall Severity Severity of abnormal movements (highest score from questions above): Mild Incapacitation due to abnormal movements: Minimal Patient's awareness of abnormal movements (rate only patient's report): Aware, mild distress, Dental Status Current problems with teeth and/or dentures?: No Does patient usually wear dentures?: No  CIWA:  CIWA-Ar Total: 5 COWS:  COWS Total Score: 6  Musculoskeletal: Strength & Muscle Tone: within normal limits Gait & Station: normal Patient leans: N/A  Psychiatric Specialty Exam:  Presentation  General Appearance:  Casual  Eye Contact: Fleeting  Speech: Clear and Coherent  Speech Volume: Normal  Handedness: Right   Mood and Affect   Mood: Dysphoric  Affect: Congruent   Thought Process  Thought Processes: Linear  Descriptions of Associations:Intact  Orientation:Full (Time, Place and Person)  Thought Content:Logical  History of Schizophrenia/Schizoaffective disorder:Yes  Duration of Psychotic Symptoms:Less than six months  Hallucinations:No data recorded Ideas of Reference:None  Suicidal Thoughts:No data recorded Homicidal Thoughts:No data recorded  Sensorium  Memory: Immediate Fair; Recent Fair  Judgment: Fair  Insight: Present; Shallow   Executive Functions  Concentration: Fair  Attention Span:  Fair  Recall: Jennelle Human of Knowledge: Fair  Language: Fair   Psychomotor Activity  Psychomotor Activity:No data recorded  Assets  Assets: Physical Health; Resilience   Sleep  Sleep:No data recorded   Physical Exam: Physical Exam Vitals reviewed.  Constitutional:      Appearance: Normal appearance.  HENT:     Head: Normocephalic and atraumatic.     Mouth/Throat:     Pharynx: Oropharynx is clear.  Eyes:     Pupils: Pupils are equal, round, and reactive to light.  Cardiovascular:     Rate and Rhythm: Normal rate and regular rhythm.  Pulmonary:     Effort: Pulmonary effort is normal.     Breath sounds: Normal breath sounds.  Abdominal:     General: Abdomen is flat.     Palpations: Abdomen is soft.  Musculoskeletal:        General: Normal range of motion.  Skin:    General: Skin is warm and dry.  Neurological:     General: No focal deficit present.     Mental Status: He is alert. Mental status is at baseline.  Psychiatric:        Attention and Perception: He is inattentive.        Mood and Affect: Mood normal.        Speech: Speech is delayed.        Behavior: Behavior is slowed.        Thought Content: Thought content normal.    Review of Systems  Constitutional: Negative.   HENT: Negative.    Eyes: Negative.   Respiratory: Negative.     Cardiovascular: Negative.   Gastrointestinal: Negative.   Musculoskeletal: Negative.   Skin: Negative.   Neurological: Negative.   Psychiatric/Behavioral: Negative.     Blood pressure 109/65, pulse 80, temperature 98.5 F (36.9 C), temperature source Oral, resp. rate 18, height 5\' 11"  (1.803 m), weight 81.2 kg, SpO2 100 %. Body mass index is 24.97 kg/m.   Treatment Plan Summary: Medication management and Plan no change to medicine.  Continue antipsychotic.  Continue daily reassessment and consideration of follow-up.  He continues to focus and say that he wants to go to [facility] for continued treatment.  Mordecai Rasmussen, MD 11/09/2022, 4:12 PM

## 2022-11-09 NOTE — Progress Notes (Signed)
Patient calm and pleasant during assessment denying SI/HI/AVH. Pt observed interacting appropriately with staff and peers on the unit. Pt compliant with medication administration per MD orders. Pt given education, support, and encouragement to be active in his treatment plan. Pt being monitored Q 15 minutes for safety per unit protocol, remains safe on the unit  

## 2022-11-10 ENCOUNTER — Other Ambulatory Visit: Payer: Self-pay

## 2022-11-10 MED ORDER — BENZTROPINE MESYLATE 1 MG PO TABS
1.0000 mg | ORAL_TABLET | Freq: Every day | ORAL | 0 refills | Status: AC
  Filled 2022-11-10: qty 10, 10d supply, fill #0

## 2022-11-10 MED ORDER — TRAZODONE HCL 50 MG PO TABS: 50.0000 mg | ORAL_TABLET | Freq: Every day | ORAL | 1 refills | Status: DC

## 2022-11-10 MED ORDER — GABAPENTIN 300 MG PO CAPS
300.0000 mg | ORAL_CAPSULE | Freq: Three times a day (TID) | ORAL | 0 refills | Status: AC
  Filled 2022-11-10: qty 30, 10d supply, fill #0

## 2022-11-10 MED ORDER — RISPERIDONE 4 MG PO TABS: 4.0000 mg | ORAL_TABLET | Freq: Every day | ORAL | 1 refills | Status: DC

## 2022-11-10 MED ORDER — TRAZODONE HCL 50 MG PO TABS
50.0000 mg | ORAL_TABLET | Freq: Every day | ORAL | 0 refills | Status: AC
  Filled 2022-11-10: qty 10, 10d supply, fill #0

## 2022-11-10 MED ORDER — GABAPENTIN 300 MG PO CAPS: 300.0000 mg | ORAL_CAPSULE | Freq: Three times a day (TID) | ORAL | 1 refills | Status: DC

## 2022-11-10 MED ORDER — BENZTROPINE MESYLATE 1 MG PO TABS: 1.0000 mg | ORAL_TABLET | Freq: Every day | ORAL | 1 refills | Status: DC

## 2022-11-10 MED ORDER — RISPERIDONE 4 MG PO TABS
4.0000 mg | ORAL_TABLET | Freq: Every day | ORAL | 0 refills | Status: AC
  Filled 2022-11-10: qty 10, 10d supply, fill #0

## 2022-11-10 NOTE — [facility] Group Notes (Signed)
[facility] Group Notes:  (Nursing/MHT/Case Management/Adjunct)  Date:  11/10/2022  Time:  10:43 AM  Type of Therapy:  Psychoeducational Skills  Participation Level:  Did Not Attend  Participation Quality:  na  Affect:  na  Cognitive:  na  Insight:  None  Engagement in Group:  na  Modes of Intervention:  na  Summary of Progress/Problems:  Pts were given a podcast to listen to regarding how to impact health through healthy diet choices. Pt did not attend.   Artrell Lawless 11/10/2022, 10:43 AM 

## 2022-11-10 NOTE — [facility] Group Notes (Signed)
[facility] Group Notes:  (Nursing/MHT/Case Management/Adjunct)  Date:  11/10/2022  Time:  10:27 AM  Type of Therapy:  Psychoeducational Skills  Participation Level:  Did Not Attend  Participation Quality:   na  Affect:   na  Cognitive:   na  Insight:  None  Engagement in Group:   na  Modes of Intervention:   na  Summary of Progress/Problems:   PT did not attend psychoeducational group.  Malva Limes 11/10/2022, 10:27 AM

## 2022-11-10 NOTE — BH IP Treatment Plan (Signed)
Interdisciplinary Treatment and Diagnostic Plan Update  11/10/2022 Time of Session: 11:43 am Ronald [name] MRN: [id number]  Principal Diagnosis: Schizophrenia [facility])  Secondary Diagnoses: Principal Problem:   Schizophrenia (HCC)   Current Medications:  Current Facility-Administered Medications  Medication Dose Route Frequency Provider Last Rate Last Admin   acetaminophen (TYLENOL) tablet 650 mg  650 mg Oral Q6H PRN Charm Rings, NP       alum & mag hydroxide-simeth (MAALOX/MYLANTA) 200-200-20 MG/5ML suspension 30 mL  30 mL Oral Q4H PRN Charm Rings, NP       benztropine (COGENTIN) tablet 1 mg  1 mg Oral QHS Charm Rings, NP   1 mg at 11/09/22 2115   diphenhydrAMINE (BENADRYL) capsule 50 mg  50 mg Oral BID PRN Charm Rings, NP       Or   diphenhydrAMINE (BENADRYL) injection 50 mg  50 mg Intramuscular BID PRN Charm Rings, NP       feeding supplement (ENSURE ENLIVE / ENSURE PLUS) liquid 237 mL  237 mL Oral BID BM Clapacs, John T, MD   237 mL at 11/10/22 0838   gabapentin (NEURONTIN) capsule 300 mg  300 mg Oral TID Charm Rings, NP   300 mg at 11/10/22 4098   haloperidol (HALDOL) tablet 5 mg  5 mg Oral BID PRN Charm Rings, NP       Or   haloperidol lactate (HALDOL) injection 5 mg  5 mg Intramuscular BID PRN Charm Rings, NP       magnesium hydroxide (MILK OF MAGNESIA) suspension 30 mL  30 mL Oral Daily PRN Charm Rings, NP       risperiDONE (RISPERDAL) tablet 4 mg  4 mg Oral QHS Charm Rings, NP   4 mg at 11/09/22 2115   traZODone (DESYREL) tablet 50 mg  50 mg Oral QHS Charm Rings, NP   50 mg at 11/09/22 2115   PTA Medications: Medications Prior to Admission  Medication Sig Dispense Refill Last Dose   nicotine polacrilex (NICORETTE) 2 MG gum Chew 1 each (2 mg total) by mouth as needed for smoking cessation. (Patient not taking: Reported on 11/04/2022) 50 tablet 0 Not Taking   [DISCONTINUED] benztropine (COGENTIN) 1 MG tablet Take 1 tablet (1 mg total) by  mouth at bedtime. (Patient not taking: Reported on 11/04/2022) 15 tablet 0 Not Taking   [DISCONTINUED] risperiDONE (RISPERDAL) 2 MG tablet Take 2 tablets (4 mg total) by mouth at bedtime. (Patient not taking: Reported on 11/04/2022) 30 tablet 0 Not Taking   [DISCONTINUED] traZODone (DESYREL) 50 MG tablet Take 1 tablet (50 mg total) by mouth at bedtime. (Patient not taking: Reported on 11/04/2022) 15 tablet 0 Not Taking    Patient Stressors: Marital or family conflict   Medication change or noncompliance   Substance abuse    Patient Strengths: Active sense of humor  General fund of knowledge   Treatment Modalities: Medication Management, Group therapy, Case management,  1 to 1 session with clinician, Psychoeducation, Recreational therapy.   Physician Treatment Plan for Primary Diagnosis: Schizophrenia (HCC) Long Term Goal(s): Improvement in symptoms so as ready for discharge   Short Term Goals: Compliance with prescribed medications will improve Ability to identify changes in lifestyle to reduce recurrence of condition will improve Ability to verbalize feelings will improve  Medication Management: Evaluate patient's response, side effects, and tolerance of medication regimen.  Therapeutic Interventions: 1 to 1 sessions, Unit Group sessions and Medication administration.  Evaluation of  Outcomes: Progressing  Physician Treatment Plan for Secondary Diagnosis: Principal Problem:   Schizophrenia (HCC)  Long Term Goal(s): Improvement in symptoms so as ready for discharge   Short Term Goals: Compliance with prescribed medications will improve Ability to identify changes in lifestyle to reduce recurrence of condition will improve Ability to verbalize feelings will improve     Medication Management: Evaluate patient's response, side effects, and tolerance of medication regimen.  Therapeutic Interventions: 1 to 1 sessions, Unit Group sessions and Medication administration.  Evaluation of  Outcomes: Progressing   RN Treatment Plan for Primary Diagnosis: Schizophrenia (HCC) Long Term Goal(s): Knowledge of disease and therapeutic regimen to maintain health will improve  Short Term Goals: Ability to remain free from injury will improve, Ability to verbalize frustration and anger appropriately will improve, Ability to demonstrate self-control, Ability to participate in decision making will improve, Ability to verbalize feelings will improve, Ability to disclose and discuss suicidal ideas, Ability to identify and develop effective coping behaviors will improve, and Compliance with prescribed medications will improve  Medication Management: RN will administer medications as ordered by provider, will assess and evaluate patient's response and provide education to patient for prescribed medication. RN will report any adverse and/or side effects to prescribing provider.  Therapeutic Interventions: 1 on 1 counseling sessions, Psychoeducation, Medication administration, Evaluate responses to treatment, Monitor vital signs and CBGs as ordered, Perform/monitor CIWA, COWS, AIMS and Fall Risk screenings as ordered, Perform wound care treatments as ordered.  Evaluation of Outcomes: Progressing   LCSW Treatment Plan for Primary Diagnosis: Schizophrenia (HCC) Long Term Goal(s): Safe transition to appropriate next level of care at discharge, Engage patient in therapeutic group addressing interpersonal concerns.  Short Term Goals: Engage patient in aftercare planning with referrals and resources, Increase social support, Increase ability to appropriately verbalize feelings, Increase emotional regulation, Facilitate acceptance of mental health diagnosis and concerns, Facilitate patient progression through stages of change regarding substance use diagnoses and concerns, Identify triggers associated with mental health/substance abuse issues, and Increase skills for wellness and recovery  Therapeutic  Interventions: Assess for all discharge needs, 1 to 1 time with Social worker, Explore available resources and support systems, Assess for adequacy in community support network, Educate family and significant other(s) on suicide prevention, Complete Psychosocial Assessment, Interpersonal group therapy.  Evaluation of Outcomes: Progressing   Progress in Treatment: Attending groups: Yes. Participating in groups: Yes. and No. Taking medication as prescribed: Yes. Toleration medication: Yes. Family/Significant other contact made: No, will contact:  patient declined Patient understands diagnosis: No Discussing patient identified problems/goals with staff: Yes. and No. Medical problems stabilized or resolved: Yes. Denies suicidal/homicidal ideation: Yes. Issues/concerns per patient self-inventory: No. Other: none  New problem(s) identified: No, Describe:  none  New Short Term/Long Term Goal(s): detox, elimination of symptoms of psychosis, medication management for mood stabilization; elimination of SI thoughts; development of comprehensive mental wellness/sobriety plan. Update 11/10/22: No changes at this time.  Patient Goals:  Patient declined to attend treatment team, though was given the opportunity. Update 11/10/22: No changes at this time.  Discharge Plan or Barriers: CSW to assist in the development of appropriate discharge plans.  Update 11/10/22: Patient voiced interest in rehab.  Reason for Continuation of Hospitalization: Anxiety Depression Medical Issues Medication stabilization Withdrawal symptoms  Estimated Length of Stay: 1-7 days  Last 3 [place] Suicide Severity Risk Score: Flowsheet Row Admission (Current) from 11/04/2022 in [facility] INPATIENT BE[facility]AL MEDICINE Most recent reading at 11/04/2022  9:00 PM ED from 11/04/2022 in [facility] Emergency Department at [facility] Most recent reading at 11/04/2022 12:49 PM ED from 08/29/2022 in [facility] Emergency Department at  [facility] Most recent reading at 08/29/2022  8:06 AM  C-SSRS RISK CATEGORY No Risk No Risk No Risk       Last PHQ 2/9 Scores:     No data to display          Scribe for Treatment Team: Marshell Levan, LCSW 11/10/2022 11:43 AM

## 2022-11-10 NOTE — Progress Notes (Signed)
Patient ID: Ronald [name], male   DOB: 19-Dec-1995, 27 y.o.   MRN: [id number] Patient presents with depressed mood, affect flat. [name] forwards little but voices no concerns and states '' I'm fine. I'm good. '' To most questions. He reports he is sleeping well, and denies any AH or VH. He denies any SI or HI. He states he has no physical complaints and has been eating and drinking well. He has been compliant with medications. Remains isolative to his room throughout shift. Pt is safe, will con't to monitor.

## 2022-11-10 NOTE — [facility] Group Notes (Signed)
[facility] Group Notes:  (Nursing/MHT/Case Management/Adjunct)  Date:  11/10/2022  Time:  2:35 PM  Type of Therapy:  activity  Participation Level:  Did Not Attend  Participation Quality:  na  Affect:  na  Cognitive:  na  Insight:  None  Engagement in Group:  na  Modes of Intervention:  na  Summary of Progress/Problems:  Courtyard Recreation Activity. Pt did not attend.   Malva Limes 11/10/2022, 2:35 PM

## 2022-11-10 NOTE — Group Note (Signed)
LCSW Group Therapy Note  Group Date: 11/10/2022 Start Time: 1307 End Time: 1412   Type of Therapy and Topic:  Group Therapy - Self-Care VS. Coping Skills Participation Level:  Did Not Attend   Description of Group The focus of this group was to determine what unhealthy coping techniques typically are used by group members and what healthy coping techniques would be helpful in coping with various problems. Patients were guided in becoming aware of the differences between healthy and unhealthy coping techniques. Patients were asked to identify 2-3 healthy coping skills they would like to learn to use more effectively.  Therapeutic Goals Patients learned that coping is what human beings do all day long to deal with various situations in their lives Patients defined and discussed healthy vs unhealthy coping techniques Patients identified their preferred coping techniques and identified whether these were healthy or unhealthy Patients determined 2-3 healthy coping skills they would like to become more familiar with and use more often. Patients provided support and ideas to each other   Summary of Patient Progress:  Pt. did not attend group.  Therapeutic Modalities Cognitive Behavioral Therapy Motivational Interviewing  Tama Headings 11/10/2022  4:43 PM

## 2022-11-10 NOTE — Progress Notes (Signed)
[facility] MD Progress Note  11/10/2022 10:33 AM Ronald [name]  MRN:  [id number] Subjective: Patient seen and chart reviewed.  Patient significantly improved day by day.  Able to sit up and actually have a very lucid conversation with me telling me about his hopes that he can go back to school in the future and get some technical training.  Acknowledging that he needs to stop using drugs and stay in contact with mental health providers.  Still stays in bed much of the time and is disheveled but has been out a little bit more. Principal Problem: Schizophrenia (HCC) Diagnosis: Principal Problem:   Schizophrenia (HCC)  Total Time spent with patient: 30 minutes  Past Psychiatric History: Past history of recurrent episodes of psychosis from schizophrenia and substance use  Past Medical History:  Past Medical History:  Diagnosis Date   Asthma    History reviewed. No pertinent surgical history. Family History: History reviewed. No pertinent family history. Family Psychiatric  History: See previous Social History:  Social History   Substance and Sexual Activity  Alcohol Use Not Currently   Alcohol/week: 2.0 standard drinks of alcohol   Types: 2 Cans of beer per week     Social History   Substance and Sexual Activity  Drug Use Yes   Types: Marijuana, Methamphetamines   Comment: unable to determine quantity/frequency    Social History   Socioeconomic History   Marital status: Single    Spouse name: Not on file   Number of children: Not on file   Years of education: Not on file   Highest education level: Not on file  Occupational History   Not on file  Tobacco Use   Smoking status: Every Day    Packs/day: .5    Types: Cigarettes   Smokeless tobacco: Current   Tobacco comments:    Patient endorsed smoking cigarettes after denying earlier in the day.    Vaping Use   Vaping Use: Never used  Substance and Sexual Activity   Alcohol use: Not Currently    Alcohol/week: 2.0 standard  drinks of alcohol    Types: 2 Cans of beer per week   Drug use: Yes    Types: Marijuana, Methamphetamines    Comment: unable to determine quantity/frequency   Sexual activity: Yes    Partners: Female    Birth control/protection: Condom  Other Topics Concern   Not on file  Social History Narrative   ** Merged History Encounter **       ** Merged History Encounter **       Social Determinants of Health   Financial Resource Strain: Not on file  Food Insecurity: No Food Insecurity (11/04/2022)   Hunger Vital Sign    Worried About Running Out of Food in the Last Year: Never true    Ran Out of Food in the Last Year: Never true  Transportation Needs: No Transportation Needs (11/04/2022)   PRAPARE - Administrator, Civil Service (Medical): No    Lack of Transportation (Non-Medical): No  Physical Activity: Not on file  Stress: Not on file  Social Connections: Not on file   Additional Social History:                         Sleep: Fair  Appetite:  Fair  Current Medications: Current Facility-Administered Medications  Medication Dose Route Frequency Provider Last Rate Last Admin   acetaminophen (TYLENOL) tablet 650 mg  650 mg Oral Q6H  PRN Charm Rings, NP       alum & mag hydroxide-simeth (MAALOX/MYLANTA) 200-200-20 MG/5ML suspension 30 mL  30 mL Oral Q4H PRN Charm Rings, NP       benztropine (COGENTIN) tablet 1 mg  1 mg Oral QHS Charm Rings, NP   1 mg at 11/09/22 2115   diphenhydrAMINE (BENADRYL) capsule 50 mg  50 mg Oral BID PRN Charm Rings, NP       Or   diphenhydrAMINE (BENADRYL) injection 50 mg  50 mg Intramuscular BID PRN Charm Rings, NP       feeding supplement (ENSURE ENLIVE / ENSURE PLUS) liquid 237 mL  237 mL Oral BID BM Juanita Streight T, MD   237 mL at 11/10/22 0838   gabapentin (NEURONTIN) capsule 300 mg  300 mg Oral TID Charm Rings, NP   300 mg at 11/10/22 1610   haloperidol (HALDOL) tablet 5 mg  5 mg Oral BID PRN Charm Rings, NP       Or   haloperidol lactate (HALDOL) injection 5 mg  5 mg Intramuscular BID PRN Charm Rings, NP       magnesium hydroxide (MILK OF MAGNESIA) suspension 30 mL  30 mL Oral Daily PRN Charm Rings, NP       risperiDONE (RISPERDAL) tablet 4 mg  4 mg Oral QHS Charm Rings, NP   4 mg at 11/09/22 2115   traZODone (DESYREL) tablet 50 mg  50 mg Oral QHS Charm Rings, NP   50 mg at 11/09/22 2115    Lab Results: No results found for this or any previous visit (from the past 48 hour(s)).  Blood Alcohol level:  Lab Results  Component Value Date   ETH <10 11/04/2022   ETH <10 08/29/2022    Metabolic Disorder Labs: Lab Results  Component Value Date   HGBA1C 5.1 11/06/2022   MPG 99.67 11/06/2022   MPG 99.67 11/02/2021   Lab Results  Component Value Date   PROLACTIN 22.8 (H) 05/08/2019   Lab Results  Component Value Date   CHOL 132 11/06/2022   TRIG 36 11/06/2022   HDL 60 11/06/2022   CHOLHDL 2.2 11/06/2022   VLDL 7 11/06/2022   LDLCALC 65 11/06/2022   LDLCALC 53 11/02/2021    Physical Findings: AIMS: Facial and Oral Movements Muscles of Facial Expression: Moderate Lips and Perioral Area: None, normal Jaw: None, normal Tongue: None, normal,Extremity Movements Upper (arms, wrists, hands, fingers): Moderate Lower (legs, knees, ankles, toes): Moderate, Trunk Movements Neck, shoulders, hips: None, normal, Overall Severity Severity of abnormal movements (highest score from questions above): Mild Incapacitation due to abnormal movements: Minimal Patient's awareness of abnormal movements (rate only patient's report): Aware, mild distress, Dental Status Current problems with teeth and/or dentures?: No Does patient usually wear dentures?: No  CIWA:  CIWA-Ar Total: 5 COWS:  COWS Total Score: 6  Musculoskeletal: Strength & Muscle Tone: within normal limits Gait & Station: normal Patient leans: N/A  Psychiatric Specialty Exam:  Presentation  General Appearance:   Casual  Eye Contact: Fleeting  Speech: Clear and Coherent  Speech Volume: Normal  Handedness: Right   Mood and Affect  Mood: Dysphoric  Affect: Congruent   Thought Process  Thought Processes: Linear  Descriptions of Associations:Intact  Orientation:Full (Time, Place and Person)  Thought Content:Logical  History of Schizophrenia/Schizoaffective disorder:Yes  Duration of Psychotic Symptoms:Less than six months  Hallucinations:No data recorded Ideas of Reference:None  Suicidal Thoughts:No data  recorded Homicidal Thoughts:No data recorded  Sensorium  Memory: Immediate Fair; Recent Fair  Judgment: Fair  Insight: Present; Shallow   Executive Functions  Concentration: Fair  Attention Span: Fair  Recall: [employer] of Knowledge: Fair  Language: Fair   Psychomotor Activity  Psychomotor Activity:No data recorded  Assets  Assets: Physical Health; Resilience   Sleep  Sleep:No data recorded   Physical Exam: Physical Exam Vitals and nursing note reviewed.  Constitutional:      Appearance: Normal appearance.  HENT:     Head: Normocephalic and atraumatic.     Mouth/Throat:     Pharynx: Oropharynx is clear.  Eyes:     Pupils: Pupils are equal, round, and reactive to light.  Cardiovascular:     Rate and Rhythm: Normal rate and regular rhythm.  Pulmonary:     Effort: Pulmonary effort is normal.     Breath sounds: Normal breath sounds.  Abdominal:     General: Abdomen is flat.     Palpations: Abdomen is soft.  Musculoskeletal:        General: Normal range of motion.  Skin:    General: Skin is warm and dry.  Neurological:     General: No focal deficit present.     Mental Status: He is alert. Mental status is at baseline.  Psychiatric:        Attention and Perception: Attention normal.        Mood and Affect: Mood normal. Affect is blunt.        Speech: Speech is delayed.        Behavior: Behavior is slowed.        Thought  Content: Thought content normal.        Cognition and Memory: Memory is impaired.    Review of Systems  Constitutional: Negative.   HENT: Negative.    Eyes: Negative.   Respiratory: Negative.    Cardiovascular: Negative.   Gastrointestinal: Negative.   Musculoskeletal: Negative.   Skin: Negative.   Neurological: Negative.   Psychiatric/Behavioral: Negative.     Blood pressure 127/70, pulse 86, temperature (!) 97.5 F (36.4 C), temperature source Oral, resp. rate 18, height 5\' 11"  (1.803 m), weight 81.2 kg, SpO2 100 %. Body mass index is 24.97 kg/m.   Treatment Plan Summary: Medication management and Plan no change in antipsychotic medication.  Supportive counseling and encouragement supporting his plans for the future reminding him that he is correct that staying in treatment will help a lot.  No change to medicine or treatment today anticipate possible discharge as early as Monday.  Mordecai Rasmussen, MD 11/10/2022, 10:33 AM

## 2022-11-11 ENCOUNTER — Other Ambulatory Visit: Payer: Self-pay

## 2022-11-11 LAB — HIV ANTIBODY (ROUTINE TESTING W REFLEX): HIV Screen 4th Generation wRfx: NONREACTIVE

## 2022-11-11 NOTE — Group Note (Signed)
Date:  11/11/2022 Time:  11:21 AM  Group Topic/Focus:  Activity Group  Due to the patients being on the unit majority of the day, I encourage the patients to go outside in the courtyard to get some fresh air and some exercise to promote wellbeing physically and mentally.      Participation Level:  Active  Participation Quality:  Appropriate  Affect:  Appropriate  Cognitive:  Appropriate  Insight: Appropriate  Engagement in Group:  Engaged  Modes of Intervention:  Activity  Additional Comments:    Ronald [name] 11/11/2022, 11:21 AM

## 2022-11-11 NOTE — Progress Notes (Signed)
[facility] MD Progress Note  11/11/2022 1:03 PM Ronald [name]  MRN:  [id number] Subjective: Patient seen and chart reviewed.  Patient is much improved today.  Out in the day room.  Interacting with others.  Speaking coherently.  Alert and oriented.  Not hallucinating. Principal Problem: Schizophrenia (HCC) Diagnosis: Principal Problem:   Schizophrenia (HCC)  Total Time spent with patient: 30 minutes  Past Psychiatric History: Past history of recurrent psychotic disorder and amphetamine abuse  Past Medical History:  Past Medical History:  Diagnosis Date   Asthma    History reviewed. No pertinent surgical history. Family History: History reviewed. No pertinent family history. Family Psychiatric  History: See previous Social History:  Social History   Substance and Sexual Activity  Alcohol Use Not Currently   Alcohol/week: 2.0 standard drinks of alcohol   Types: 2 Cans of beer per week     Social History   Substance and Sexual Activity  Drug Use Yes   Types: Marijuana, Methamphetamines   Comment: unable to determine quantity/frequency    Social History   Socioeconomic History   Marital status: Single    Spouse name: Not on file   Number of children: Not on file   Years of education: Not on file   Highest education level: Not on file  Occupational History   Not on file  Tobacco Use   Smoking status: Every Day    Packs/day: .5    Types: Cigarettes   Smokeless tobacco: Current   Tobacco comments:    Patient endorsed smoking cigarettes after denying earlier in the day.    Vaping Use   Vaping Use: Never used  Substance and Sexual Activity   Alcohol use: Not Currently    Alcohol/week: 2.0 standard drinks of alcohol    Types: 2 Cans of beer per week   Drug use: Yes    Types: Marijuana, Methamphetamines    Comment: unable to determine quantity/frequency   Sexual activity: Yes    Partners: Female    Birth control/protection: Condom  Other Topics Concern   Not on file   Social History Narrative   ** Merged History Encounter **       ** Merged History Encounter **       Social Determinants of Health   Financial Resource Strain: Not on file  Food Insecurity: No Food Insecurity (11/04/2022)   Hunger Vital Sign    Worried About Running Out of Food in the Last Year: Never true    Ran Out of Food in the Last Year: Never true  Transportation Needs: No Transportation Needs (11/04/2022)   PRAPARE - Administrator, Civil Service (Medical): No    Lack of Transportation (Non-Medical): No  Physical Activity: Not on file  Stress: Not on file  Social Connections: Not on file   Additional Social History:                         Sleep: Fair  Appetite:  Fair  Current Medications: Current Facility-Administered Medications  Medication Dose Route Frequency Provider Last Rate Last Admin   acetaminophen (TYLENOL) tablet 650 mg  650 mg Oral Q6H PRN Charm Rings, NP       alum & mag hydroxide-simeth (MAALOX/MYLANTA) 200-200-20 MG/5ML suspension 30 mL  30 mL Oral Q4H PRN Charm Rings, NP       benztropine (COGENTIN) tablet 1 mg  1 mg Oral QHS Charm Rings, NP   1 mg  at 11/10/22 2050   diphenhydrAMINE (BENADRYL) capsule 50 mg  50 mg Oral BID PRN Charm Rings, NP       Or   diphenhydrAMINE (BENADRYL) injection 50 mg  50 mg Intramuscular BID PRN Charm Rings, NP       feeding supplement (ENSURE ENLIVE / ENSURE PLUS) liquid 237 mL  237 mL Oral BID BM [name], MD   237 mL at 11/11/22 0827   gabapentin (NEURONTIN) capsule 300 mg  300 mg Oral TID Charm Rings, NP   300 mg at 11/11/22 6045   haloperidol (HALDOL) tablet 5 mg  5 mg Oral BID PRN Charm Rings, NP       Or   haloperidol lactate (HALDOL) injection 5 mg  5 mg Intramuscular BID PRN Charm Rings, NP       magnesium hydroxide (MILK OF MAGNESIA) suspension 30 mL  30 mL Oral Daily PRN Charm Rings, NP       risperiDONE (RISPERDAL) tablet 4 mg  4 mg Oral QHS Charm Rings, NP   4 mg at 11/10/22 2049   traZODone (DESYREL) tablet 50 mg  50 mg Oral QHS Charm Rings, NP   50 mg at 11/10/22 2050    Lab Results:  Results for orders placed or performed during the hospital encounter of 11/04/22 (from the past 48 hour(s))  HIV Antibody (routine testing w rflx)     Status: None   Collection Time: 11/10/22  4:46 PM  Result Value Ref Range   HIV Screen 4th Generation wRfx Non Reactive Non Reactive    Comment: Performed at [facility], [address] [place], [place] [address]    Blood Alcohol level:  Lab Results  Component Value Date   ETH <10 11/04/2022   ETH <10 08/29/2022    Metabolic Disorder Labs: Lab Results  Component Value Date   HGBA1C 5.1 11/06/2022   MPG 99.67 11/06/2022   MPG 99.67 11/02/2021   Lab Results  Component Value Date   PROLACTIN 22.8 (H) 05/08/2019   Lab Results  Component Value Date   CHOL 132 11/06/2022   TRIG 36 11/06/2022   HDL 60 11/06/2022   CHOLHDL 2.2 11/06/2022   VLDL 7 11/06/2022   LDLCALC 65 11/06/2022   LDLCALC 53 11/02/2021    Physical Findings: AIMS: Facial and Oral Movements Muscles of Facial Expression: Moderate Lips and Perioral Area: None, normal Jaw: None, normal Tongue: None, normal,Extremity Movements Upper (arms, wrists, hands, fingers): Moderate Lower (legs, knees, ankles, toes): Moderate, Trunk Movements Neck, shoulders, hips: None, normal, Overall Severity Severity of abnormal movements (highest score from questions above): Mild Incapacitation due to abnormal movements: Minimal Patient's awareness of abnormal movements (rate only patient's report): Aware, mild distress, Dental Status Current problems with teeth and/or dentures?: No Does patient usually wear dentures?: No  CIWA:  CIWA-Ar Total: 5 COWS:  COWS Total Score: 6  Musculoskeletal: Strength & Muscle Tone: within normal limits Gait & Station: normal Patient leans: N/A  Psychiatric Specialty  Exam:  Presentation  General Appearance:  Casual  Eye Contact: Fleeting  Speech: Clear and Coherent  Speech Volume: Normal  Handedness: Right   Mood and Affect  Mood: Dysphoric  Affect: Congruent   Thought Process  Thought Processes: Linear  Descriptions of Associations:Intact  Orientation:Full (Time, Place and Person)  Thought Content:Logical  History of Schizophrenia/Schizoaffective disorder:Yes  Duration of Psychotic Symptoms:Less than six months  Hallucinations:No data recorded Ideas of  Reference:None  Suicidal Thoughts:No data recorded Homicidal Thoughts:No data recorded  Sensorium  Memory: Immediate Fair; Recent Fair  Judgment: Fair  Insight: Present; Shallow   Executive Functions  Concentration: Fair  Attention Span: Fair  Recall: [employer] of Knowledge: Fair  Language: Fair   Psychomotor Activity  Psychomotor Activity:No data recorded  Assets  Assets: Physical Health; Resilience   Sleep  Sleep:No data recorded   Physical Exam: Physical Exam Vitals and nursing note reviewed.  Constitutional:      Appearance: Normal appearance.  HENT:     Head: Normocephalic and atraumatic.     Mouth/Throat:     Pharynx: Oropharynx is clear.  Eyes:     Pupils: Pupils are equal, round, and reactive to light.  Cardiovascular:     Rate and Rhythm: Normal rate and regular rhythm.  Pulmonary:     Effort: Pulmonary effort is normal.     Breath sounds: Normal breath sounds.  Abdominal:     General: Abdomen is flat.     Palpations: Abdomen is soft.  Musculoskeletal:        General: Normal range of motion.  Skin:    General: Skin is warm and dry.  Neurological:     General: No focal deficit present.     Mental Status: He is alert. Mental status is at baseline.  Psychiatric:        Attention and Perception: Attention normal.        Mood and Affect: Mood normal.        Speech: Speech normal.        Behavior: Behavior  normal.        Thought Content: Thought content normal.        Cognition and Memory: Cognition normal.    Review of Systems  Constitutional: Negative.   HENT: Negative.    Eyes: Negative.   Respiratory: Negative.    Cardiovascular: Negative.   Gastrointestinal: Negative.   Musculoskeletal: Negative.   Skin: Negative.   Neurological: Negative.   Psychiatric/Behavioral: Negative.     Blood pressure (!) 90/52, pulse 74, temperature 97.8 F (36.6 C), temperature source Oral, resp. rate 18, height 5\' 11"  (1.803 m), weight 81.2 kg, SpO2 99 %. Body mass index is 24.97 kg/m.   Treatment Plan Summary: Plan doing much better.  Supportive counseling and review of the importance of followed up with outpatient care.  We are anticipating likely discharge tomorrow with referral back to [facility].  Mordecai Rasmussen, MD 11/11/2022, 1:03 PM

## 2022-11-11 NOTE — Group Note (Signed)
Date:  11/11/2022 Time:  9:14 PM  Group Topic/Focus:  Building Self Esteem:   The Focus of this group is helping patients become aware of the effects of self-esteem on their lives, the things they and others do that enhance or undermine their self-esteem, seeing the relationship between their level of self-esteem and the choices they make and learning ways to enhance self-esteem.    Participation Level:  Active  Participation Quality:  Attentive  Affect:  Appropriate  Cognitive:  Appropriate  Insight: Improving  Engagement in Group:  Developing/Improving  Modes of Intervention:  Support  Additional Comments:     Rahkim Rabalais 11/11/2022, 9:14 PM

## 2022-11-11 NOTE — Progress Notes (Signed)
Patient alert and oriented x 4, affect is flat but brightens upon approach, he denies SI/HI/AVH, he is interacting appropriately with peers and staff. Patient was offered emotional support  and encouragement will continue to monitor.

## 2022-11-11 NOTE — Plan of Care (Signed)
  Problem: Education: Goal: Knowledge of [facility] Education information/materials will improve Outcome: Not Progressing Goal: Emotional status will improve Outcome: Not Progressing Goal: Mental status will improve Outcome: Not Progressing Goal: Verbalization of understanding the information provided will improve Outcome: Not Progressing   Problem: Activity: Goal: Interest or engagement in activities will improve Outcome: Not Progressing Goal: Sleeping patterns will improve Outcome: Not Progressing   Problem: Coping: Goal: Ability to verbalize frustrations and anger appropriately will improve Outcome: Not Progressing Goal: Ability to demonstrate self-control will improve Outcome: Not Progressing   Problem: Health Behavior/Discharge Planning: Goal: Identification of resources available to assist in meeting health care needs will improve Outcome: Not Progressing Goal: Compliance with treatment plan for underlying cause of condition will improve Outcome: Not Progressing   Problem: Physical Regulation: Goal: Ability to maintain clinical measurements within normal limits will improve Outcome: Not Progressing   Problem: Safety: Goal: Periods of time without injury will increase Outcome: Not Progressing   Problem: Activity: Goal: Will verbalize the importance of balancing activity with adequate rest periods Outcome: Not Progressing   Problem: Education: Goal: Will be free of psychotic symptoms Outcome: Not Progressing Goal: Knowledge of the prescribed therapeutic regimen will improve Outcome: Not Progressing   Problem: Coping: Goal: Coping ability will improve Outcome: Not Progressing Goal: Will verbalize feelings Outcome: Not Progressing   Problem: Health Behavior/Discharge Planning: Goal: Compliance with prescribed medication regimen will improve Outcome: Not Progressing   Problem: Nutritional: Goal: Ability to achieve adequate nutritional intake will  improve Outcome: Not Progressing   Problem: Role Relationship: Goal: Ability to communicate needs accurately will improve Outcome: Not Progressing Goal: Ability to interact with others will improve Outcome: Not Progressing   Problem: Safety: Goal: Ability to redirect hostility and anger into socially appropriate behaviors will improve Outcome: Not Progressing Goal: Ability to remain free from injury will improve Outcome: Not Progressing   Problem: Self-Care: Goal: Ability to participate in self-care as condition permits will improve Outcome: Not Progressing   Problem: Self-Concept: Goal: Will verbalize positive feelings about self Outcome: Not Progressing   

## 2022-11-11 NOTE — Progress Notes (Signed)
D- Patient alert and oriented x 3-4. Affect flat/mood congruent. Denies SI/ HI/ AVH. Patient denies pain. Patient endorses he depression and anxiety. He verbalizes concerns about being discharged tomorrow and wants to know the results of lab work for "STD". HIV test non reactive. A- Scheduled medications administered to patient, per MD orders. Support and encouragement provided.  Routine safety checks conducted every 15 minutes without incident.  Patient informed to notify staff with problems or concerns and verbalizes understanding. R- No adverse drug reactions noted.  Patient compliant with medications and treatment plan. Patient receptive, calm cooperative and interacts well with others on the unit.  Patient contracts for safety and  remains safe on the unit at this time.

## 2022-11-12 ENCOUNTER — Other Ambulatory Visit: Payer: Self-pay

## 2022-11-12 NOTE — Progress Notes (Signed)
Patient pleasant and cooperative on approach. Denies SI,HI and AVH. Verbalized understanding discharge instructions,prescriptions and follow up care. 7 days medicines given to patient. All belongings returned from BMU locker. Suicide safety plan filled by patient and placed in chart. Copy given to patient.Patient escorted out by staff and transported by family. 

## 2022-11-12 NOTE — Discharge Summary (Signed)
Physician Discharge Summary Note  Patient:  Ronald [name] is an 27 y.o., male MRN:  [id number] DOB:  03/08/1996 Patient phone:  [phone] (home)  Patient address:   [address] [place] [address],  Total Time spent with patient: 30 minutes  Date of Admission:  11/04/2022 Date of Discharge: 11/12/2022  Reason for Admission: Patient was admitted after presenting with disorganized psychosis and poor self-care in the context of medicine noncompliance and presumed substance abuse.  Principal Problem: Schizophrenia [facility]) Discharge Diagnoses: Principal Problem:   Schizophrenia [facility])   Past Psychiatric History: History of established mental health problems with multiple prior admissions under very similar circumstances.  Patient appears to have a schizophrenia illness as well as chronic substance use problems greatly worsening his condition.  Past Medical History:  Past Medical History:  Diagnosis Date   Asthma    History reviewed. No pertinent surgical history. Family History: History reviewed. No pertinent family history. Family Psychiatric  History: See previous Social History:  Social History   Substance and Sexual Activity  Alcohol Use Not Currently   Alcohol/week: 2.0 standard drinks of alcohol   Types: 2 Cans of beer per week     Social History   Substance and Sexual Activity  Drug Use Yes   Types: Marijuana, Methamphetamines   Comment: unable to determine quantity/frequency    Social History   Socioeconomic History   Marital status: Single    Spouse name: Not on file   Number of children: Not on file   Years of education: Not on file   Highest education level: Not on file  Occupational History   Not on file  Tobacco Use   Smoking status: Every Day    Packs/day: .5    Types: Cigarettes   Smokeless tobacco: Current   Tobacco comments:    Patient endorsed smoking cigarettes after denying earlier in the day.    Vaping Use   Vaping Use: Never  used  Substance and Sexual Activity   Alcohol use: Not Currently    Alcohol/week: 2.0 standard drinks of alcohol    Types: 2 Cans of beer per week   Drug use: Yes    Types: Marijuana, Methamphetamines    Comment: unable to determine quantity/frequency   Sexual activity: Yes    Partners: Female    Birth control/protection: Condom  Other Topics Concern   Not on file  Social History Narrative   ** Merged History Encounter **       ** Merged History Encounter **       Social Determinants of Health   Financial Resource Strain: Not on file  Food Insecurity: No Food Insecurity (11/04/2022)   Hunger Vital Sign    Worried About Running Out of Food in the Last Year: Never true    Ran Out of Food in the Last Year: Never true  Transportation Needs: No Transportation Needs (11/04/2022)   PRAPARE - Administrator, Civil Service (Medical): No    Lack of Transportation (Non-Medical): No  Physical Activity: Not on file  Stress: Not on file  Social Connections: Not on file    Hospital Course: Admitted to the psychiatric unit.  Patient was not aggressive violent nor did he engage in any self-harm.  He denied suicidal ideation.  He was very withdrawn and disorganized to the point of being unable to really even speak coherently for the first couple days but did cooperate with taking medication.  By this weekend he was  much improved and was able to hold lucid conversations.  He acknowledges that noncompliance with medicine and substance abuse are continuing to cause major life problems for him.  He has been seen by the liaison from [facility] and states that he is planning to follow-up with them.  Patient will be given a supply of medicine and prescriptions at discharge and strongly urged to continue his plan for outpatient follow-up.  No evidence of dangerousness at discharge.  Physical Findings: AIMS: Facial and Oral Movements Muscles of Facial Expression: Moderate Lips and Perioral Area: None,  normal Jaw: None, normal Tongue: None, normal,Extremity Movements Upper (arms, wrists, hands, fingers): Moderate Lower (legs, knees, ankles, toes): Moderate, Trunk Movements Neck, shoulders, hips: None, normal, Overall Severity Severity of abnormal movements (highest score from questions above): Mild Incapacitation due to abnormal movements: Minimal Patient's awareness of abnormal movements (rate only patient's report): Aware, mild distress, Dental Status Current problems with teeth and/or dentures?: No Does patient usually wear dentures?: No  CIWA:  CIWA-Ar Total: 5 COWS:  COWS Total Score: 6  Musculoskeletal: Strength & Muscle Tone: within normal limits Gait & Station: normal Patient leans: N/A   Psychiatric Specialty Exam:  Presentation  General Appearance:  Casual  Eye Contact: Fleeting  Speech: Clear and Coherent  Speech Volume: Normal  Handedness: Right   Mood and Affect  Mood: Dysphoric  Affect: Congruent   Thought Process  Thought Processes: Linear  Descriptions of Associations:Intact  Orientation:Full (Time, Place and Person)  Thought Content:Logical  History of Schizophrenia/Schizoaffective disorder:Yes  Duration of Psychotic Symptoms:Less than six months  Hallucinations:No data recorded Ideas of Reference:None  Suicidal Thoughts:No data recorded Homicidal Thoughts:No data recorded  Sensorium  Memory: Immediate Fair; Recent Fair  Judgment: Fair  Insight: Present; Shallow   Executive Functions  Concentration: Fair  Attention Span: Fair  Recall: [employer] of Knowledge: Fair  Language: Fair   Psychomotor Activity  Psychomotor Activity:No data recorded  Assets  Assets: Physical Health; Resilience   Sleep  Sleep:No data recorded   Physical Exam: Physical Exam Vitals and nursing note reviewed.  Constitutional:      Appearance: Normal appearance.  HENT:     Head: Normocephalic and atraumatic.      Mouth/Throat:     Pharynx: Oropharynx is clear.  Eyes:     Pupils: Pupils are equal, round, and reactive to light.  Cardiovascular:     Rate and Rhythm: Normal rate and regular rhythm.  Pulmonary:     Effort: Pulmonary effort is normal.     Breath sounds: Normal breath sounds.  Abdominal:     General: Abdomen is flat.     Palpations: Abdomen is soft.  Musculoskeletal:        General: Normal range of motion.  Skin:    General: Skin is warm and dry.  Neurological:     General: No focal deficit present.     Mental Status: He is alert. Mental status is at baseline.  Psychiatric:        Attention and Perception: Attention normal.        Mood and Affect: Mood normal.        Speech: Speech normal.        Behavior: Behavior is cooperative.        Thought Content: Thought content normal.        Cognition and Memory: Cognition normal.        Judgment: Judgment normal.    Review of Systems  Constitutional: Negative.  HENT: Negative.    Eyes: Negative.   Respiratory: Negative.    Cardiovascular: Negative.   Gastrointestinal: Negative.   Musculoskeletal: Negative.   Skin: Negative.   Neurological: Negative.   Psychiatric/Behavioral: Negative.     Blood pressure 112/87, pulse 86, temperature 98.3 F (36.8 C), temperature source Oral, resp. rate 20, height 5\' 11"  (1.803 m), weight 81.2 kg, SpO2 100 %. Body mass index is 24.97 kg/m.   Social History   Tobacco Use  Smoking Status Every Day   Packs/day: .5   Types: Cigarettes  Smokeless Tobacco Current  Tobacco Comments   Patient endorsed smoking cigarettes after denying earlier in the day.     Tobacco Cessation:  A prescription for an FDA-approved tobacco cessation medication was offered at discharge and the patient refused   Blood Alcohol level:  Lab Results  Component Value Date   [facility] <10 11/04/2022   ETH <10 08/29/2022    Metabolic Disorder Labs:  Lab Results  Component Value Date   HGBA1C 5.1 11/06/2022   MPG  99.67 11/06/2022   MPG 99.67 11/02/2021   Lab Results  Component Value Date   PROLACTIN 22.8 (H) 05/08/2019   Lab Results  Component Value Date   CHOL 132 11/06/2022   TRIG 36 11/06/2022   HDL 60 11/06/2022   CHOLHDL 2.2 11/06/2022   VLDL 7 11/06/2022   LDLCALC 65 11/06/2022   LDLCALC 53 11/02/2021    See Psychiatric Specialty Exam and Suicide Risk Assessment completed by Attending Physician prior to discharge.  Discharge destination:  Home  Is patient on multiple antipsychotic therapies at discharge:  No   Has Patient had three or more failed trials of antipsychotic monotherapy by history:  No  Recommended Plan for Multiple Antipsychotic Therapies: NA  Discharge Instructions     Diet - low sodium heart healthy   Complete by: As directed    Increase activity slowly   Complete by: As directed       Allergies as of 11/12/2022       Reactions   Pork-derived Products Other (See Comments)   Does not eat        Medication List     STOP taking these medications    nicotine polacrilex 2 MG gum Commonly known as: NICORETTE       TAKE these medications      Indication  benztropine 1 MG tablet Commonly known as: COGENTIN Take 1 tablet (1 mg total) by mouth at bedtime.  Indication: Extrapyramidal Reaction caused by Medications   gabapentin 300 MG capsule Commonly known as: NEURONTIN Take 1 capsule (300 mg total) by mouth 3 (three) times daily.  Indication: Social Anxiety Disorder   risperidone 4 MG tablet Commonly known as: RISPERDAL Take 1 tablet (4 mg total) by mouth at bedtime. What changed: medication strength  Indication: Schizophrenia   traZODone 50 MG tablet Commonly known as: DESYREL Take 1 tablet (50 mg total) by mouth at bedtime.  Indication: Trouble Sleeping         Follow-up recommendations:  Other:  Follow-up with [facility].  Continue current medicine.  Comments: See above  Signed: Mordecai Rasmussen, MD 11/12/2022, 11:09  AM

## 2022-11-12 NOTE — Progress Notes (Signed)
Patient alert and oriented x 4,  thoughts are organized and coherent , affect is flat but brightens upon approach, he denies SI/HI/AVH, and noted interacting appropriately with peers and staff. Patient was complaint with medication regimen. Patient was offered emotional support and encouragement will continue to monitor.

## 2022-11-12 NOTE — Progress Notes (Signed)
  [facility] Adult Case Management Discharge Plan :  Will you be returning to the same living situation after discharge:  Yes,  pt returning home upon discharge. At discharge, do you have transportation home?: Yes,  mother to provide transportation home. Do you have the ability to pay for your medications: Yes,  [facility].  Release of information consent forms completed and in the chart;  Patient's signature needed at discharge.  Patient to Follow up at:  Follow-up Information     [facility] Follow up.   Why: You have an appointment scheduled with Vernie Shanks, care manager on Thursday, 11/15/22 at 11am. She will be coming to see you at your mother's home. Thanks! Contact information: [address] [place] [address] [phone]                 Next level of care provider has access to [facility] Link:no  Safety Planning and Suicide Prevention discussed: Yes,  SPE completed with pt.     Has patient been referred to the Quitline?: Yes, faxed/e-referral on 11/12/22  Patient has been referred for addiction treatment: Yes, the patient will follow up with an outpatient provider for substance use disorder. Psychiatrist/APP: appointment made  Glenis Smoker, LCSW 11/12/2022, 4:37 PM

## 2022-11-12 NOTE — [facility] Suicide Risk Assessment (Signed)
[facility] Discharge Suicide Risk Assessment   Principal Problem: Schizophrenia [facility]) Discharge Diagnoses: Principal Problem:   Schizophrenia (HCC)   Total Time spent with patient: 30 minutes  Musculoskeletal: Strength & Muscle Tone: within normal limits Gait & Station: normal Patient leans: N/A  Psychiatric Specialty Exam  Presentation  General Appearance:  Casual  Eye Contact: Fleeting  Speech: Clear and Coherent  Speech Volume: Normal  Handedness: Right   Mood and Affect  Mood: Dysphoric  Duration of Depression Symptoms: Greater than two weeks  Affect: Congruent   Thought Process  Thought Processes: Linear  Descriptions of Associations:Intact  Orientation:Full (Time, Place and Person)  Thought Content:Logical  History of Schizophrenia/Schizoaffective disorder:Yes  Duration of Psychotic Symptoms:Less than six months  Hallucinations:No data recorded Ideas of Reference:None  Suicidal Thoughts:No data recorded Homicidal Thoughts:No data recorded  Sensorium  Memory: Immediate Fair; Recent Fair  Judgment: Fair  Insight: Present; Shallow   Executive Functions  Concentration: Fair  Attention Span: Fair  Recall: [employer] of Knowledge: Fair  Language: Fair   Psychomotor Activity  Psychomotor Activity:No data recorded  Assets  Assets: Physical Health; Resilience   Sleep  Sleep:No data recorded  Physical Exam: Physical Exam Vitals and nursing note reviewed.  Constitutional:      Appearance: Normal appearance.  HENT:     Head: Normocephalic and atraumatic.     Mouth/Throat:     Pharynx: Oropharynx is clear.  Eyes:     Pupils: Pupils are equal, round, and reactive to light.  Cardiovascular:     Rate and Rhythm: Normal rate and regular rhythm.  Pulmonary:     Effort: Pulmonary effort is normal.     Breath sounds: Normal breath sounds.  Abdominal:     General: Abdomen is flat.     Palpations: Abdomen is soft.   Musculoskeletal:        General: Normal range of motion.  Skin:    General: Skin is warm and dry.  Neurological:     General: No focal deficit present.     Mental Status: He is alert. Mental status is at baseline.  Psychiatric:        Attention and Perception: Attention normal.        Mood and Affect: Mood normal.        Speech: Speech normal.        Behavior: Behavior is cooperative.        Thought Content: Thought content normal.        Cognition and Memory: Cognition normal.        Judgment: Judgment normal.    Review of Systems  Constitutional: Negative.   HENT: Negative.    Eyes: Negative.   Respiratory: Negative.    Cardiovascular: Negative.   Gastrointestinal: Negative.   Musculoskeletal: Negative.   Skin: Negative.   Neurological: Negative.   Psychiatric/Behavioral: Negative.     Blood pressure 112/87, pulse 86, temperature 98.3 F (36.8 C), temperature source Oral, resp. rate 20, height 5\' 11"  (1.803 m), weight 81.2 kg, SpO2 100 %. Body mass index is 24.97 kg/m.  Mental Status Per Nursing Assessment::   On Admission:  Suicidal ideation indicated by others  Demographic Factors:  Male and Low socioeconomic status  Loss Factors: Financial problems/change in socioeconomic status  Historical Factors: Impulsivity  Risk Reduction Factors:   Positive social support and Positive therapeutic relationship  Continued Clinical Symptoms:  Alcohol/Substance Abuse/Dependencies Schizophrenia:   Less than 58 years old  Cognitive Features That Contribute To Risk:  None    Suicide Risk:  Minimal: No identifiable suicidal ideation.  Patients presenting with no risk factors but with morbid ruminations; may be classified as minimal risk based on the severity of the depressive symptoms    Plan Of Care/Follow-up recommendations:  Other:  Patient denies suicidal ideation and has not displayed any dangerous behavior in the hospital.  His psychosis has improved  dramatically over the past several days.  He is now lucid and able to carry on appropriate conversations and to state that he plans to continue medication management and follow-up with outpatient providers.  He has been counseled about the dangers of drug abuse and strongly encouraged to follow-up with recommended substance abuse treatment.  No evidence of acute dangerousness at time of discharge  Mordecai Rasmussen, MD 11/12/2022, 11:07 AM

## 2022-11-12 NOTE — Group Note (Signed)
Recreation Therapy Group Note   Group Topic:Goal Setting  Group Date: 11/12/2022 Start Time: 1000 End Time: 1100 Facilitators: Rosina Lowenstein, LRT, CTRS Location:  Craft Room  Group Description: Scientist, research (physical sciences). Patients were given many different magazines, a glue stick, markers, and a piece of cardstock paper. LRT and pts discussed the importance of having goals in life. LRT and pts discussed the difference between short-term and long-term goals, as well as what a SMART goal is. LRT encouraged pts to create a vision board, with images they picked and then cut out with safety scissors from the magazine, for themselves, that capture their short and long-term goals. LRT encouraged pts to show and explain their vision board to the group. LRT offered to laminate vision board once dry and complete.   Goal Area(s) Addressed:  Patient will gain knowledge of short vs. long term goals.  Patient will identify goals for themselves. Patient will practice setting SMART goals. Patient will verbalize their goals to LRT and peers.  Affect/Mood: Appropriate   Participation Level: Active and Engaged   Participation Quality: Independent   Behavior: Appropriate and Cooperative   Speech/Thought Process: Coherent   Insight: Good   Judgement: Good   Modes of Intervention: Art   Patient Response to Interventions:  Attentive, Engaged, Interested , and Receptive   Education Outcome:  Acknowledges education   Clinical Observations/Individualized Feedback: Arvin was active in their participation of session activities and group discussion. Pt identified "I want to have a genuine authentic life. I also want to get some sort or animal" as his goals. Pt appropriately cut out images to represent goals identified. Pt interacted well with LRT and peers duration of session.   Plan: Continue to engage patient in RT group sessions 2-3x/week.   Rosina Lowenstein, LRT, CTRS 11/12/2022 11:24 AM

## 2023-12-26 ENCOUNTER — Emergency Department (HOSPITAL_COMMUNITY): Payer: MEDICAID

## 2023-12-26 ENCOUNTER — Other Ambulatory Visit: Payer: Self-pay

## 2023-12-26 ENCOUNTER — Emergency Department (HOSPITAL_COMMUNITY)
Admission: EM | Admit: 2023-12-26 | Discharge: 2023-12-26 | Disposition: A | Discharge status: Home / Self Care | Payer: MEDICAID | Attending: Emergency Medicine | Admitting: Emergency Medicine

## 2023-12-26 ENCOUNTER — Encounter (HOSPITAL_COMMUNITY): Payer: Self-pay | Admitting: *Deleted

## 2023-12-26 DIAGNOSIS — L02211 Cutaneous abscess of abdominal wall: Secondary | ICD-10-CM | POA: Diagnosis not present

## 2023-12-26 DIAGNOSIS — R109 Unspecified abdominal pain: Secondary | ICD-10-CM | POA: Diagnosis present

## 2023-12-26 LAB — CBC
HCT: 43.9 % (ref 39.0–52.0)
Hemoglobin: 14.1 g/dL (ref 13.0–17.0)
MCH: 29.7 pg (ref 26.0–34.0)
MCHC: 32.1 g/dL (ref 30.0–36.0)
MCV: 92.6 fL (ref 80.0–100.0)
Platelets: 510 K/uL — ABNORMAL HIGH (ref 150–400)
RBC: 4.74 MIL/uL (ref 4.22–5.81)
RDW: 12.5 % (ref 11.5–15.5)
WBC: 9.8 K/uL (ref 4.0–10.5)
nRBC: 0 % (ref 0.0–0.2)

## 2023-12-26 LAB — COMPREHENSIVE METABOLIC PANEL WITH GFR
ALT: 12 U/L (ref 0–44)
AST: 18 U/L (ref 15–41)
Albumin: 3.1 g/dL — ABNORMAL LOW (ref 3.5–5.0)
Alkaline Phosphatase: 69 U/L (ref 38–126)
Anion gap: 11 (ref 5–15)
BUN: 6 mg/dL (ref 6–20)
CO2: 24 mmol/L (ref 22–32)
Calcium: 9 mg/dL (ref 8.9–10.3)
Chloride: 100 mmol/L (ref 98–111)
Creatinine, Ser: 0.95 mg/dL (ref 0.61–1.24)
GFR, Estimated: >60 mL/min (ref >60)
Glucose, Bld: 94 mg/dL (ref 70–99)
Potassium: 3.9 mmol/L (ref 3.5–5.1)
Sodium: 135 mmol/L (ref 135–145)
Total Bilirubin: 0.7 mg/dL (ref 0.0–1.2)
Total Protein: 7.7 g/dL (ref 6.5–8.1)

## 2023-12-26 LAB — LIPASE, BLOOD: Lipase: 29 U/L (ref 11–51)

## 2023-12-26 LAB — I-STAT CG4 LACTIC ACID, ED: Lactic Acid, Venous: 0.6 mmol/L (ref 0.5–1.9)

## 2023-12-26 MED ORDER — DOXYCYCLINE HYCLATE 100 MG PO CAPS: 100.0000 mg | ORAL_CAPSULE | Freq: Two times a day (BID) | ORAL | 0 refills | Status: AC

## 2023-12-26 MED ORDER — AMOXICILLIN-POT CLAVULANATE 875-125 MG PO TABS: 1.0000 | ORAL_TABLET | Freq: Two times a day (BID) | ORAL | 0 refills | Status: AC

## 2023-12-26 MED ORDER — AMOXICILLIN-POT CLAVULANATE 875-125 MG PO TABS
1.0000 | ORAL_TABLET | Freq: Once | ORAL | Status: AC
  Administered 2023-12-26: 1 via ORAL
  Filled 2023-12-26: qty 1

## 2023-12-26 MED ORDER — DOXYCYCLINE HYCLATE 100 MG PO TABS
100.0000 mg | ORAL_TABLET | Freq: Once | ORAL | Status: AC
  Administered 2023-12-26: 100 mg via ORAL
  Filled 2023-12-26: qty 1

## 2023-12-26 MED ORDER — IOHEXOL 350 MG/ML SOLN
75.0000 mL | Freq: Once | INTRAVENOUS | Status: AC | PRN
  Administered 2023-12-26: 75 mL via INTRAVENOUS

## 2023-12-26 MED ORDER — LIDOCAINE HCL (PF) 1 % IJ SOLN
30.0000 mL | Freq: Once | INTRAMUSCULAR | Status: AC
  Administered 2023-12-26: 30 mL
  Filled 2023-12-26: qty 30

## 2023-12-26 NOTE — ED Triage Notes (Signed)
 The pt is having pain in his abd for 2 weeks  he reports that he has a scar on his abdomen  that he does not know how long it been there and wahts its from

## 2023-12-26 NOTE — ED Provider Notes (Signed)
[facility] Provider Note   CSN: [id number] Arrival date & time: 12/26/23  0131     Patient presents with: Abdominal Pain   Ronald [name] is a 28 y.o. male.   Patient presents to the emergency department with complaints of abdominal pain.  Patient reports that he has pain, swelling and drainage from the lower portion of his abdomen.  He is not sure when it started or what is causing it.       Prior to Admission medications   Medication Sig Start Date End Date Taking? Authorizing Provider  amoxicillin -clavulanate (AUGMENTIN ) 875-125 MG tablet Take 1 tablet by mouth every 12 (twelve) hours. 12/26/23  Yes Nemesio Castrillon, Lonni PARAS, MD  doxycycline  (VIBRAMYCIN ) 100 MG capsule Take 1 capsule (100 mg total) by mouth 2 (two) times daily. 12/26/23  Yes Javar Eshbach, Lonni PARAS, MD  benztropine  (COGENTIN ) 1 MG tablet Take 1 tablet (1 mg total) by mouth at bedtime. 11/10/22   Clapacs, Norleen DASEN, MD  gabapentin  (NEURONTIN ) 300 MG capsule Take 1 capsule (300 mg total) by mouth 3 (three) times daily. 11/10/22   Clapacs, Norleen DASEN, MD  risperidone  (RISPERDAL ) 4 MG tablet Take 1 tablet (4 mg total) by mouth at bedtime. 11/10/22   Clapacs, Norleen DASEN, MD  traZODone  (DESYREL ) 50 MG tablet Take 1 tablet (50 mg total) by mouth at bedtime. 11/10/22   Clapacs, Norleen DASEN, MD    Allergies: Pork-derived products    Review of Systems  Updated Vital Signs BP 123/72   Pulse 85   Temp 98.1 F (36.7 C)   Resp 16   Ht 5' 11 (1.803 m)   Wt 81.2 kg   SpO2 98%   BMI 24.97 kg/m   Physical Exam Vitals and nursing note reviewed.  Constitutional:      General: He is not in acute distress.    Appearance: He is well-developed.  HENT:     Head: Normocephalic and atraumatic.     Mouth/Throat:     Mouth: Mucous membranes are moist.  Eyes:     General: Vision grossly intact. Gaze aligned appropriately.     Extraocular Movements: Extraocular movements intact.      Conjunctiva/sclera: Conjunctivae normal.  Cardiovascular:     Rate and Rhythm: Normal rate and regular rhythm.     Pulses: Normal pulses.     Heart sounds: Normal heart sounds, S1 normal and S2 normal. No murmur heard.    No friction rub. No gallop.  Pulmonary:     Effort: Pulmonary effort is normal. No respiratory distress.     Breath sounds: Normal breath sounds.  Abdominal:     Palpations: Abdomen is soft.     Tenderness: There is no guarding or rebound.     Hernia: No hernia is present.  Musculoskeletal:        General: No swelling.     Cervical back: Full passive range of motion without pain, normal range of motion and neck supple. No pain with movement, spinous process tenderness or muscular tenderness. Normal range of motion.     Right lower leg: No edema.     Left lower leg: No edema.  Skin:    General: Skin is warm and dry.     Capillary Refill: Capillary refill takes less than 2 seconds.     Findings: Abscess (Low central abdominal wall, actively draining) present. No ecchymosis, erythema, lesion or wound.  Neurological:     Mental Status: He is alert and  oriented to person, place, and time.     GCS: GCS eye subscore is 4. GCS verbal subscore is 5. GCS motor subscore is 6.     Cranial Nerves: Cranial nerves 2-12 are intact.     Sensory: Sensation is intact.     Motor: Motor function is intact. No weakness or abnormal muscle tone.     Coordination: Coordination is intact.  Psychiatric:        Mood and Affect: Mood normal.        Speech: Speech normal.        Behavior: Behavior normal.     (all labs ordered are listed, but only abnormal results are displayed) Labs Reviewed  COMPREHENSIVE METABOLIC PANEL WITH GFR - Abnormal; Notable for the following components:      Result Value   Albumin 3.1 (*)    All other components within normal limits  CBC - Abnormal; Notable for the following components:   Platelets 510 (*)    All other components within normal limits   AEROBIC CULTURE W GRAM STAIN (SUPERFICIAL SPECIMEN)  LIPASE, BLOOD  URINALYSIS, ROUTINE W REFLEX MICROSCOPIC  I-STAT CG4 LACTIC ACID, ED    EKG: None  Radiology: CT ABDOMEN PELVIS W CONTRAST Result Date: 12/26/2023 CLINICAL DATA:  Abdominal wall abscess, abdominal pain EXAM: CT ABDOMEN AND PELVIS WITH CONTRAST TECHNIQUE: Multidetector CT imaging of the abdomen and pelvis was performed using the standard protocol following bolus administration of intravenous contrast. RADIATION DOSE REDUCTION: This exam was performed according to the departmental dose-optimization program which includes automated exposure control, adjustment of the mA and/or kV according to patient size and/or use of iterative reconstruction technique. CONTRAST:  75mL OMNIPAQUE  IOHEXOL  350 MG/ML SOLN COMPARISON:  None Available. FINDINGS: Lower chest: No acute abnormality. Hepatobiliary: No focal liver abnormality is seen. No gallstones, gallbladder wall thickening, or biliary dilatation. Pancreas: Unremarkable Spleen: Unremarkable Adrenals/Urinary Tract: Adrenal glands are unremarkable. Kidneys are normal, without renal calculi, focal lesion, or hydronephrosis. Bladder is unremarkable. Stomach/Bowel: Moderate colonic stool burden. Stomach, small bowel, and large bowel are otherwise unremarkable. No evidence of obstruction or focal inflammation. Appendix is absent. No free intraperitoneal gas or fluid. Vascular/Lymphatic: No significant vascular findings are present. No enlarged abdominal or pelvic lymph nodes. Reproductive: Prostate is unremarkable. Other: There is induration of the periumbilical anterior abdominal wall with extensive inflammatory change within the subcutaneous fat as well as a subdermal complex fluid collection measuring 1.4 x 3.4 x 3.7 cm compatible with given history abdominal wall abscess. This collection remains contained within the subcutaneous fat inflammatory changes do not extend deep to the deep abdominal  fascia. No abdominal wall hernia. Musculoskeletal: No acute or significant osseous findings. IMPRESSION: 1. Periumbilical anterior abdominal wall cellulitis with a subdermal complex fluid collection measuring 1.4 x 3.4 x 3.7 cm compatible with a abdominal wall abscess. This collection remains contained within the subcutaneous fat. 2. Moderate colonic stool burden. Electronically Signed   By: Dorethia Molt M.D.   On: 12/26/2023 04:18     .Incision and Drainage  Date/Time: 12/26/2023 5:59 AM  Performed by: Haze Lonni PARAS, MD Authorized by: Haze Lonni PARAS, MD   Consent:    Consent obtained:  Verbal   Consent given by:  Patient   Risks, benefits, and alternatives were discussed: yes     Risks discussed:  Bleeding, incomplete drainage and pain Universal protocol:    Procedure explained and questions answered to patient or proxy's satisfaction: yes     Relevant documents present and  verified: yes     Test results available : yes     Imaging studies available: yes     Required blood products, implants, devices, and special equipment available: yes     Site/side marked: yes     Immediately prior to procedure, a time out was called: yes     Patient identity confirmed:  Verbally with patient Location:    Type:  Abscess   Location:  Trunk   Trunk location:  Abdomen Pre-procedure details:    Skin preparation:  Povidone-iodine Sedation:    Sedation type:  None Anesthesia:    Anesthesia method:  Local infiltration   Local anesthetic:  Lidocaine  1% w/o epi Procedure type:    Complexity:  Simple Procedure details:    Ultrasound guidance: no     Needle aspiration: no     Incision types:  Single straight   Wound management:  Probed and deloculated, irrigated with saline and extensive cleaning   Drainage:  Purulent   Drainage amount:  Moderate   Wound treatment:  Wound left open   Packing materials:  None Post-procedure details:    Procedure completion:  Tolerated well, no  immediate complications    Medications Ordered in the ED  amoxicillin -clavulanate (AUGMENTIN ) 875-125 MG per tablet 1 tablet (has no administration in time range)  doxycycline  (VIBRA -TABS) tablet 100 mg (has no administration in time range)  iohexol  (OMNIPAQUE ) 350 MG/ML injection 75 mL (75 mLs Intravenous Contrast Given 12/26/23 0355)  lidocaine  (PF) (XYLOCAINE ) 1 % injection 30 mL (30 mLs Infiltration Given 12/26/23 0456)                                    Medical Decision Making Amount and/or Complexity of Data Reviewed Labs: ordered. Decision-making details documented in ED Course. Radiology: ordered and independent interpretation performed. Decision-making details documented in ED Course.  Risk Prescription drug management.   Differential Diagnosis considered includes, but not limited to: Cholelithiasis; cholecystitis; cholangitis; bowel obstruction; esophagitis; gastritis; peptic ulcer disease; pancreatitis; abdominal wall infection  Patient presented to the emergency department stated he has diffuse abdominal pain.  He reports that he has an area on his abdomen that has formed a scab.  He reports that the scab has fallen off a couple of times and then reformed.  He is not sure what caused the original wound.  Here in the emergency department there is evidence of purulent drainage coming from the area.  A CAT scan was performed.  There is no deep infection, there is evidence of soft tissue abscess.  I recommended opening this wider to fully drain the region.  Patient did consent to this procedure.  I did perform an incision and drainage at the bedside.  He will be placed on broad-spectrum antibiotics.  Vital signs are stable.  No fever.  No signs of sepsis.  Appropriate for outpatient management.  Patient given return precautions.      Final diagnoses:  Abdominal wall abscess    ED Discharge Orders          Ordered    amoxicillin -clavulanate (AUGMENTIN ) 875-125 MG tablet   Every 12 hours        12/26/23 0609    doxycycline  (VIBRAMYCIN ) 100 MG capsule  2 times daily        12/26/23 0609               Haze Lonni PARAS, MD 12/26/23  0611  

## 2023-12-26 NOTE — ED Notes (Signed)
Cannot provide UA at this time.

## 2023-12-28 LAB — AEROBIC CULTURE W GRAM STAIN (SUPERFICIAL SPECIMEN)

## 2023-12-29 ENCOUNTER — Telehealth (HOSPITAL_BASED_OUTPATIENT_CLINIC_OR_DEPARTMENT_OTHER): Payer: Self-pay | Admitting: *Deleted

## 2023-12-29 NOTE — Telephone Encounter (Signed)
 Post ED Visit - Positive Culture Follow-up: Unsuccessful Patient Follow-up  Culture assessed and recommendations reviewed by:  [x]  Dorn Buttner, Pharm.D. []  Venetia Gully, [address].D., BCPS AQ-ID []  Garrel Crews, Pharm.D., BCPS []  Almarie Lunger, Pharm.D., BCPS []  [place], [address].D., BCPS, AAHIVP []  Rosaline Bihari, Pharm.D., BCPS, AAHIVP []  Massie Rigg, PharmD []  Jodie Rower, PharmD, BCPS  Positive aerobic culture Plan:  STOP Doxycycline  CONTINUE AUGMENTIN  START: Bactrim DS Tablets: Take 2 tablets twice daily for 10 days (Qty 40; Refills 0)   ED Provider: Greig Buttery   Unable to contact patient , letter will be sent to address on file  Ronald [name] 12/29/2023, 3:45 PM

## 2023-12-29 NOTE — Progress Notes (Signed)
 ED Antimicrobial Stewardship Positive Culture Follow Up   Ronald [name] is an 28 y.o. male who presented to [facility] on @ADMITDT @ with a chief complaint of  Chief Complaint  Patient presents with   Abdominal Pain    Recent Results (from the past 720 hours)  Aerobic Culture w Gram Stain (superficial specimen)     Status: None   Collection Time: 12/26/23  2:39 AM   Specimen: Abscess  Result Value Ref Range Status   Specimen Description ABSCESS  Final   Special Requests ABDOMEN  Final   Gram Stain   Final    ABUNDANT WBC PRESENT, PREDOMINANTLY PMN ABUNDANT GRAM POSITIVE COCCI Performed at [facility], [address] [place], [place] [address]    Culture   Final    ABUNDANT METHICILLIN RESISTANT STAPHYLOCOCCUS AUREUS   Report Status 12/28/2023 FINAL  Final   Organism ID, Bacteria METHICILLIN RESISTANT STAPHYLOCOCCUS AUREUS  Final      Susceptibility   Methicillin resistant staphylococcus aureus - MIC*    CIPROFLOXACIN <=0.5 SENSITIVE Sensitive     ERYTHROMYCIN 4 INTERMEDIATE Intermediate     GENTAMICIN <=0.5 SENSITIVE Sensitive     OXACILLIN >=4 RESISTANT Resistant     TETRACYCLINE >=16 RESISTANT Resistant     VANCOMYCIN 1 SENSITIVE Sensitive     TRIMETH/SULFA <=10 SENSITIVE Sensitive     CLINDAMYCIN <=0.25 SENSITIVE Sensitive     RIFAMPIN <=0.5 SENSITIVE Sensitive     Inducible Clindamycin NEGATIVE Sensitive     LINEZOLID 2 SENSITIVE Sensitive     * ABUNDANT METHICILLIN RESISTANT STAPHYLOCOCCUS AUREUS    STOP Doxycycline  CONTINUE AUGMENTIN  START: Bactrim DS Tablets: Take 2 tablets twice daily for 10 days (Qty 40; Refills 0)  ED Provider: Greig Corinthia Dorn Job, PharmD, BCPS 12/29/2023 10:56 AM ED Clinical Pharmacist -  [phone]

## 2024-05-14 ENCOUNTER — Ambulatory Visit: Payer: MEDICAID | Admitting: Family Medicine
